# Patient Record
Sex: Female | Born: 1940 | Race: White | Hispanic: No | Marital: Married | Smoking: Former smoker
Health system: Southern US, Community
[De-identification: ages and names within clinical notes are randomized; demographics above are authoritative.]

## PROBLEM LIST (undated history)

## (undated) DIAGNOSIS — D649 Anemia, unspecified: Secondary | ICD-10-CM

## (undated) DIAGNOSIS — C569 Malignant neoplasm of unspecified ovary: Secondary | ICD-10-CM

## (undated) DIAGNOSIS — S46001A Unspecified injury of muscle(s) and tendon(s) of the rotator cuff of right shoulder, initial encounter: Secondary | ICD-10-CM

## (undated) DIAGNOSIS — I10 Essential (primary) hypertension: Secondary | ICD-10-CM

## (undated) DIAGNOSIS — M5030 Other cervical disc degeneration, unspecified cervical region: Secondary | ICD-10-CM

## (undated) DIAGNOSIS — N200 Calculus of kidney: Secondary | ICD-10-CM

## (undated) DIAGNOSIS — C719 Malignant neoplasm of brain, unspecified: Secondary | ICD-10-CM

## (undated) DIAGNOSIS — H409 Unspecified glaucoma: Secondary | ICD-10-CM

## (undated) DIAGNOSIS — E079 Disorder of thyroid, unspecified: Secondary | ICD-10-CM

## (undated) DIAGNOSIS — J910 Malignant pleural effusion: Secondary | ICD-10-CM

## (undated) DIAGNOSIS — K5790 Diverticulosis of intestine, part unspecified, without perforation or abscess without bleeding: Secondary | ICD-10-CM

## (undated) DIAGNOSIS — K219 Gastro-esophageal reflux disease without esophagitis: Secondary | ICD-10-CM

## (undated) DIAGNOSIS — C5700 Malignant neoplasm of unspecified fallopian tube: Secondary | ICD-10-CM

## (undated) DIAGNOSIS — Z8669 Personal history of other diseases of the nervous system and sense organs: Secondary | ICD-10-CM

## (undated) DIAGNOSIS — G43909 Migraine, unspecified, not intractable, without status migrainosus: Secondary | ICD-10-CM

## (undated) DIAGNOSIS — E785 Hyperlipidemia, unspecified: Secondary | ICD-10-CM

## (undated) HISTORY — PX: ABDOMINAL HYSTERECTOMY: SHX81

## (undated) HISTORY — DX: Hyperlipidemia, unspecified: E78.5

## (undated) HISTORY — DX: Gastro-esophageal reflux disease without esophagitis: K21.9

## (undated) HISTORY — DX: Personal history of other diseases of the nervous system and sense organs: Z86.69

## (undated) HISTORY — DX: Other cervical disc degeneration, unspecified cervical region: M50.30

## (undated) HISTORY — PX: TONSILLECTOMY AND ADENOIDECTOMY: SUR1326

## (undated) HISTORY — PX: OTHER SURGICAL HISTORY: SHX169

## (undated) HISTORY — PX: BILATERAL SALPINGOOPHORECTOMY: SHX1223

## (undated) HISTORY — DX: Migraine, unspecified, not intractable, without status migrainosus: G43.909

## (undated) HISTORY — DX: Unspecified glaucoma: H40.9

## (undated) HISTORY — DX: Anemia, unspecified: D64.9

## (undated) HISTORY — DX: Unspecified injury of muscle(s) and tendon(s) of the rotator cuff of right shoulder, initial encounter: S46.001A

## (undated) HISTORY — DX: Malignant neoplasm of brain, unspecified: C71.9

## (undated) HISTORY — DX: Malignant neoplasm of unspecified ovary: C56.9

## (undated) HISTORY — DX: Diverticulosis of intestine, part unspecified, without perforation or abscess without bleeding: K57.90

## (undated) HISTORY — DX: Essential (primary) hypertension: I10

## (undated) HISTORY — DX: Calculus of kidney: N20.0

## (undated) HISTORY — DX: Disorder of thyroid, unspecified: E07.9

---
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
## 2002-12-02 HISTORY — PX: EYE SURGERY: SHX253

## 2011-10-18 DIAGNOSIS — M810 Age-related osteoporosis without current pathological fracture: Secondary | ICD-10-CM

## 2011-10-18 HISTORY — DX: Age-related osteoporosis without current pathological fracture: M81.0

## 2012-09-29 DIAGNOSIS — N952 Postmenopausal atrophic vaginitis: Secondary | ICD-10-CM

## 2012-09-29 HISTORY — DX: Postmenopausal atrophic vaginitis: N95.2

## 2014-09-05 HISTORY — PX: NOSE SURGERY: SHX723

## 2015-08-03 HISTORY — PX: COLONOSCOPY: SHX174

## 2017-04-03 DIAGNOSIS — J910 Malignant pleural effusion: Secondary | ICD-10-CM

## 2017-04-03 HISTORY — DX: Malignant pleural effusion: J91.0

## 2017-08-02 HISTORY — PX: CHOLECYSTECTOMY: SHX55

## 2018-11-02 DIAGNOSIS — C5700 Malignant neoplasm of unspecified fallopian tube: Secondary | ICD-10-CM

## 2018-11-02 HISTORY — DX: Malignant neoplasm of unspecified fallopian tube: C57.00

## 2019-07-26 ENCOUNTER — Telehealth: Payer: Self-pay | Admitting: *Deleted

## 2019-07-26 NOTE — Telephone Encounter (Signed)
Called and spoke with the patient, scheduled an appt for 9/16

## 2019-08-17 DIAGNOSIS — J910 Malignant pleural effusion: Secondary | ICD-10-CM

## 2019-08-18 ENCOUNTER — Encounter: Payer: Self-pay | Admitting: Gynecologic Oncology

## 2019-08-18 ENCOUNTER — Other Ambulatory Visit: Payer: Self-pay

## 2019-08-18 ENCOUNTER — Inpatient Hospital Stay: Payer: Medicare Other

## 2019-08-18 ENCOUNTER — Inpatient Hospital Stay: Payer: Medicare Other | Attending: Gynecologic Oncology | Admitting: Gynecologic Oncology

## 2019-08-18 VITALS — BP 118/76 | HR 62 | Temp 98.0°F | Resp 17 | Ht 62.0 in | Wt 160.7 lb

## 2019-08-18 DIAGNOSIS — E785 Hyperlipidemia, unspecified: Secondary | ICD-10-CM | POA: Diagnosis not present

## 2019-08-18 DIAGNOSIS — Z9221 Personal history of antineoplastic chemotherapy: Secondary | ICD-10-CM

## 2019-08-18 DIAGNOSIS — Z791 Long term (current) use of non-steroidal anti-inflammatories (NSAID): Secondary | ICD-10-CM | POA: Insufficient documentation

## 2019-08-18 DIAGNOSIS — C569 Malignant neoplasm of unspecified ovary: Secondary | ICD-10-CM | POA: Diagnosis present

## 2019-08-18 DIAGNOSIS — Z90722 Acquired absence of ovaries, bilateral: Secondary | ICD-10-CM | POA: Diagnosis not present

## 2019-08-18 DIAGNOSIS — Z90710 Acquired absence of both cervix and uterus: Secondary | ICD-10-CM

## 2019-08-18 DIAGNOSIS — I10 Essential (primary) hypertension: Secondary | ICD-10-CM | POA: Diagnosis not present

## 2019-08-18 DIAGNOSIS — M8580 Other specified disorders of bone density and structure, unspecified site: Secondary | ICD-10-CM | POA: Diagnosis not present

## 2019-08-18 DIAGNOSIS — J910 Malignant pleural effusion: Secondary | ICD-10-CM | POA: Insufficient documentation

## 2019-08-18 DIAGNOSIS — E039 Hypothyroidism, unspecified: Secondary | ICD-10-CM | POA: Insufficient documentation

## 2019-08-18 DIAGNOSIS — Z79899 Other long term (current) drug therapy: Secondary | ICD-10-CM | POA: Diagnosis not present

## 2019-08-18 NOTE — Progress Notes (Signed)
Consult Note: Gyn-Onc  Waunita Andrea Emminger 78 y.o. female  CC:  Chief Complaint  Patient presents with  . Ovarian Cancer    Assessment/Plan:  Ms. Keyshawna Andrea Asay  is a 78 y.o.  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  She is currently NED/stable disease with questionable cardiophrenic nodes in the chest that are enlarged and PET avid but stable over time.  She has no symptoms concerning for recurrence and no significant residual toxicity from her therapies.  She is scheduled to see Dr. Callahan in [place] in October 2020 where a repeat PET scan will be performed in addition to a Ca1 25 and physical evaluation.  She is currently receiving 3 monthly evaluations with regular serial pet imaging to monitor the nodal disease.  We will obtain a Ca1 25 at today's visit to serve as baseline for [place] lab.  She will follow-up here in [place] periodically when she is located in town and schedule for routine follow-up, or if she develops recurrence and desires treatment here locally in town as her son lives here.  When she follows up with Dr. Callahan she will inquire about BRCA germline and somatic testing.   HPI: Ms Adelynn Brickey is a 78 year old P2 who was seen as a new patient for transfer of care for a history of recurrent stage IIIC fallopian tube cancer first diagnosed in May, 2018.   The patient presented with advanced intraperitoneal disease and underwent 3 cycles of neoadjuvant chemotherapy with carboplatin paclitaxel between May and August 2018.  She then underwent an exploratory laparotomy and interval cytoreductive surgery with Dr. Callahan in [place] in August 2018.  She was staged as stage IIIb.  She then completed 3 postoperative cycles of adjuvant chemotherapy with carboplatin  paclitaxel.  Germline genetic testings were negative.  She remained disease-free after completing chemotherapy in November 2018 until an asymptomatic elevation in Ca1 25 was noted to be at level 30 in November 2019.  In February 2028 had risen to 54 and a CT scan of the chest abdomen and pelvis did not show evidence of recurrent disease.  Subsequent PET CT imaging performed in March 2020 was significant for hypermetabolic left temporal lobe brain mass which measured 1.5 x 2 cm and additional pericardiophrenic left lymph node measuring 1.1 cm.  The brain recurrence was treated with surgical resection followed by brain radiation.  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  She had received ongoing observation therapy with serial PET imaging throughout 2020 to monitor the cardiophrenic lymph node.  It had remained stable including at the July 2020 PET/CT.  At this time it measured 12 x 16 mm.  Is hypermetabolic with an FDG uptake of 8.1.  No new lymph nodes were present on no new signs of progressive metastatic disease.  Ca1 25 was normal at 24 on June 14, 2019.  The patient is otherwise quite healthy.  She has a history of glaucoma, hypertension, hyperlipidemia, renal stones, diverticulosis, hypothyroidism, degenerative cervical intervertebral discs, osteopenia, anemia and difficulty sleeping.  Her past surgical history is remarkable for cholecystectomy, tonsillectomy, a remote history of a hysterectomy in 1987 for benign disease, and exploratory laparotomy bilateral salpingo-oophorectomy with radical tumor debulking on July 22, 2017 in [place] at [facility], and eye surgery in 2004.  Her last colonoscopy was   in 2011.  She has had 2 prior vaginal deliveries.  She has no significant family history for malignancy particular breast or ovarian.  Her son lives in [place] and is Magazine features editor of the Network engineer at [employer].   His team was instrumental in the development of paclitaxel as a chemotherapeutic agent.  She has grandchildren who live in the area and are involved with sports.  Her daughter lives in [place] and works in event development and business.  Due to the lack of proximity of her adult children and her grandchildren the patient and her husband plan to spend increasing time in [place] and away from [place] as they age.  If she requires future salvage therapy for her ovarian cancer relapses she desires treatment in [place] as she will be close to her son in that event.  Current Meds:  Outpatient Encounter Medications as of 08/18/2019  Medication Sig  . atorvastatin (LIPITOR) 40 MG tablet Take 40 mg by mouth daily.  Marland Kitchen BIOTIN PO Take by mouth.  . calcium carbonate (TUMS - DOSED IN MG ELEMENTAL CALCIUM) 500 MG chewable tablet Chew 1 tablet by mouth daily.  Marland Kitchen CRANBERRY PO Take by mouth.  . dexamethasone (DECADRON) 4 MG tablet Take 4 mg by mouth 2 (two) times daily with a meal.  . docusate sodium (COLACE) 100 MG capsule Take 100 mg by mouth 2 (two) times daily.  . fluticasone (VERAMYST) 27.5 MCG/SPRAY nasal spray Place 2 sprays into the nose daily.  Marland Kitchen guaiFENesin (MUCINEX) 600 MG 12 hr tablet Take by mouth daily.  . hydrochlorothiazide (HYDRODIURIL) 25 MG tablet Take 25 mg by mouth daily.  Marland Kitchen levETIRAcetam (KEPPRA) 500 MG tablet Take 500 mg by mouth 2 (two) times daily.  Marland Kitchen levothyroxine (SYNTHROID) 50 MCG tablet Take 50 mcg by mouth daily before breakfast.  . meclizine (ANTIVERT) 25 MG tablet Take 25 mg by mouth 3 (three) times daily as needed for dizziness.  . Melatonin 10 MG TABS Take by mouth.  . meloxicam (MOBIC) 15 MG tablet Take 15 mg by mouth daily.  . mirabegron ER (MYRBETRIQ) 50 MG TB24 tablet Take 50 mg by mouth daily.  . Multiple Vitamin (MULTIVITAMIN) tablet Take 1 tablet by mouth daily.  . nitrofurantoin, macrocrystal-monohydrate, (MACROBID) 100 MG capsule Take 100 mg by mouth 2  (two) times daily.  Marland Kitchen omeprazole (PRILOSEC) 20 MG capsule Take 20 mg by mouth daily.  . propranolol (INNOPRAN XL) 120 MG 24 hr capsule Take 120 mg by mouth at bedtime.  . SUMAtriptan (IMITREX) 100 MG tablet Take 100 mg by mouth every 2 (two) hours as needed for migraine. May repeat in 2 hours if headache persists or recurs.  . timolol (TIMOPTIC-XR) 0.25 % ophthalmic gel-forming Place 1 drop into both eyes daily.  . [DISCONTINUED] HYDROcodone-acetaminophen (NORCO/VICODIN) 5-325 MG tablet Take 1 tablet by mouth every 6 (six) hours as needed for moderate pain.  . [DISCONTINUED] HYDROCODONE-APAP-DIETARY PROD PO Take by mouth.   No facility-administered encounter medications on file as of 08/18/2019.     Allergy:  Allergies  Allergen Reactions  . Ambien [Zolpidem Tartrate] Other (See Comments)    Delirium  . Ceclor [Cefaclor] Other (See Comments)    unknown  . Keflex [Cephalexin] Other (See Comments)    unknown  . Noroxin [Norfloxacin] Other (See Comments)    unknown  . Scallops [Shellfish Allergy] Nausea And Vomiting and Swelling    Social Hx:   Social History   Socioeconomic History  . Marital status: Married  Spouse name: Not on file  . Number of children: Not on file  . Years of education: Not on file  . Highest education level: Not on file  Occupational History  . Not on file  Social Needs  . Financial resource strain: Not on file  . Food insecurity    Worry: Not on file    Inability: Not on file  . Transportation needs    Medical: Not on file    Non-medical: Not on file  Tobacco Use  . Smoking status: Never Smoker  . Smokeless tobacco: Never Used  Substance and Sexual Activity  . Alcohol use: Not on file  . Drug use: Never  . Sexual activity: Not on file  Lifestyle  . Physical activity    Days per week: Not on file    Minutes per session: Not on file  . Stress: Not on file  Relationships  . Social Herbalist on phone: Not on file    Gets  together: Not on file    Attends religious service: Not on file    Active member of club or organization: Not on file    Attends meetings of clubs or organizations: Not on file    Relationship status: Not on file  . Intimate partner violence    Fear of current or ex partner: Not on file    Emotionally abused: Not on file    Physically abused: Not on file    Forced sexual activity: Not on file  Other Topics Concern  . Not on file  Social History Narrative  . Not on file    Past Surgical Hx: History reviewed. No pertinent surgical history.  Past Medical Hx:  Past Medical History:  Diagnosis Date  . Anemia   . Brain cancer ([place])    metastasis  . Fallopian tube cancer, carcinoma ([place]) 11/02/2018  . Hyperlipidemia   . Hypertension   . 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 Migraine   . Ovarian cancer ([place])   . Thyroid disease    hypothyroid    Past Gynecological History:  See HPI, SVD x 2 No LMP recorded.  Family Hx:  Family History  Problem Relation Age of Onset  . Hypertension Mother   . Congestive Heart Failure Mother   . Macular degeneration Father   . Non-Hodgkin's lymphoma Brother     Review of Systems:  Constitutional  Feels well,   ENT Normal appearing ears and nares bilaterally Skin/Breast  No rash, sores, jaundice, itching, dryness Cardiovascular  No chest pain, shortness of breath, or edema  Pulmonary  No cough or wheeze.  Gastro Intestinal  No nausea, vomitting, or diarrhoea. No bright red blood per rectum, no abdominal pain, change in bowel movement, or constipation.  Genito Urinary  No frequency, urgency, dysuria, no bleeding.  Musculo Skeletal  No myalgia, arthralgia, joint swelling or pain  Neurologic  No weakness, numbness, change in gait,  Psychology  No depression, anxiety, insomnia. + insomnia  Vitals:  Blood pressure 118/76, pulse 62, temperature 98 F (36.7 C), temperature source Oral, resp. rate 17, height 5' 2" (1.575 m),  weight 160 lb 11.2 oz (72.9 kg), SpO2 100 %.  Physical Exam: WD in NAD Neck  Supple NROM, without any enlargements.  Lymph Node Survey No cervical supraclavicular or inguinal adenopathy Cardiovascular  Pulse normal rate, regularity and rhythm. S1 and S2 normal.  Lungs  Clear to auscultation bilateraly, without wheezes/crackles/rhonchi. Good air movement.  Skin  No rash/lesions/breakdown  Psychiatry  Alert and oriented to person, place, and time  Abdomen  Normoactive bowel sounds, abdomen soft, non-tender and mildly obese without evidence of hernia. Back No CVA tenderness Genito Urinary  Vulva/vagina: Normal external female genitalia.  No lesions. No discharge or bleeding.  Bladder/urethra:  No lesions or masses, well supported bladder  Vagina: normal, smooth, no masses, no lesions  Cervix and uterus surgically absent  Adnexa: no palpable masses. Rectal  deferred Extremities  No bilateral cyanosis, clubbing or edema.   Emma C Rossi, MD  08/18/2019, 5:29 PM     

## 2019-08-18 NOTE — Patient Instructions (Signed)
Dr Serita Grit office can be reached at [phone] to schedule appointments or to ask her questions about your care.  She will have a CA 125 drawn today to evaluate your baseline value.  Next time you visit Dr Elvis Coil office, ask if you received "somatic and germline genetic testing for BRCA genes or homologous recombination deficits".   Dr Denman George recommends continuing 3 monthly evaluations as Dr Rogue Bussing has proposed, and will be happy to incorporate visits at her office as this suits your schedule.

## 2019-08-19 LAB — CA 125: Cancer Antigen (CA) 125: 25.5 U/mL (ref 0.0–38.1)

## 2019-08-20 ENCOUNTER — Telehealth: Payer: Self-pay

## 2019-08-20 NOTE — Telephone Encounter (Signed)
I let Connie Sharp know her CA 125 was 25.5. I told her we faxed a copy of the results to her physician In [place]. She verbalized understanding.

## 2019-08-23 ENCOUNTER — Ambulatory Visit
Admission: RE | Admit: 2019-08-23 | Discharge: 2019-08-23 | Disposition: A | Discharge status: Home / Self Care | Payer: Self-pay | Source: Ambulatory Visit | Attending: Gynecologic Oncology | Admitting: Gynecologic Oncology

## 2019-08-23 DIAGNOSIS — C569 Malignant neoplasm of unspecified ovary: Secondary | ICD-10-CM

## 2019-10-11 ENCOUNTER — Telehealth: Payer: Self-pay | Admitting: *Deleted

## 2019-10-11 NOTE — Telephone Encounter (Signed)
Returned the patient's call and gave her the number to [facility] billing

## 2021-03-02 ENCOUNTER — Telehealth: Payer: Self-pay | Admitting: *Deleted

## 2021-03-02 NOTE — Telephone Encounter (Signed)
Called and left the patient a message to call the office back. Patient needs a follow up appt scheduled

## 2021-03-05 ENCOUNTER — Ambulatory Visit: Payer: Medicare Other | Admitting: Gynecologic Oncology

## 2021-03-06 ENCOUNTER — Telehealth: Payer: Self-pay | Admitting: *Deleted

## 2021-03-06 NOTE — Telephone Encounter (Addendum)
Patient's son called the office to try and schedule an appt for this week. Explained that "Per Dr Denman George, our office needed records from the office at [facility] before our office sees her." Patient's son stated "I don't know Dr Denman George would need the records, she has seen her before. She has also worked with moms doctor before too. I understand the policy for the records needs. But I don't think she will really them. We are only talking about a couple of weeks to month for to be here. Just go ahead and schedule the appt. My sister is in town for the next three days and would like to come and sit down with her and talk. Just go ahead and schedule the appt. Why can't you do that." Explained that per Dr Denman George "no appt until our office has records." Explained that I would give the message to Dr Denman George who is at [facility] today and the office will call him back. Spoke with [facility] and was giving permission to schedule the patient without all the records.    Called the patient and left a message to call the office back. Patient scheduled for an appt on 4/7 at 2:30 pm

## 2021-03-07 ENCOUNTER — Other Ambulatory Visit: Payer: Self-pay

## 2021-03-07 ENCOUNTER — Telehealth: Payer: Self-pay | Admitting: *Deleted

## 2021-03-07 ENCOUNTER — Telehealth: Payer: Self-pay | Admitting: Oncology

## 2021-03-07 ENCOUNTER — Ambulatory Visit
Admission: RE | Admit: 2021-03-07 | Discharge: 2021-03-07 | Disposition: A | Discharge status: Home / Self Care | Payer: Self-pay | Source: Ambulatory Visit | Attending: Gynecologic Oncology | Admitting: Gynecologic Oncology

## 2021-03-07 ENCOUNTER — Encounter: Payer: Self-pay | Admitting: Gynecologic Oncology

## 2021-03-07 DIAGNOSIS — C569 Malignant neoplasm of unspecified ovary: Secondary | ICD-10-CM

## 2021-03-07 DIAGNOSIS — C5700 Malignant neoplasm of unspecified fallopian tube: Secondary | ICD-10-CM

## 2021-03-07 NOTE — Telephone Encounter (Signed)
Called Dr. Arbutus Leas office with [facility] and requested BRCA results, PD-L1 results, genetic testing results and chemotherapy treatment history.  Also sent faxed request to [phone].

## 2021-03-07 NOTE — Telephone Encounter (Addendum)
Called and spoke with the patient, scheduled a follow up appt for tomorrow. Also called and left the patient's son a message with appt date/time

## 2021-03-08 ENCOUNTER — Other Ambulatory Visit: Payer: Self-pay

## 2021-03-08 ENCOUNTER — Encounter: Payer: Self-pay | Admitting: Oncology

## 2021-03-08 ENCOUNTER — Inpatient Hospital Stay: Payer: Medicare Other | Attending: Gynecologic Oncology | Admitting: Gynecologic Oncology

## 2021-03-08 VITALS — BP 127/63 | HR 82 | Temp 97.6°F | Resp 16 | Ht 62.0 in | Wt 154.2 lb

## 2021-03-08 DIAGNOSIS — C7931 Secondary malignant neoplasm of brain: Secondary | ICD-10-CM | POA: Insufficient documentation

## 2021-03-08 DIAGNOSIS — Z923 Personal history of irradiation: Secondary | ICD-10-CM | POA: Insufficient documentation

## 2021-03-08 DIAGNOSIS — C569 Malignant neoplasm of unspecified ovary: Secondary | ICD-10-CM

## 2021-03-08 DIAGNOSIS — K5909 Other constipation: Secondary | ICD-10-CM | POA: Diagnosis not present

## 2021-03-08 DIAGNOSIS — Z90722 Acquired absence of ovaries, bilateral: Secondary | ICD-10-CM | POA: Diagnosis not present

## 2021-03-08 DIAGNOSIS — G620 Drug-induced polyneuropathy: Secondary | ICD-10-CM | POA: Insufficient documentation

## 2021-03-08 DIAGNOSIS — Z79899 Other long term (current) drug therapy: Secondary | ICD-10-CM | POA: Diagnosis not present

## 2021-03-08 DIAGNOSIS — Z9221 Personal history of antineoplastic chemotherapy: Secondary | ICD-10-CM | POA: Insufficient documentation

## 2021-03-08 DIAGNOSIS — T380X5A Adverse effect of glucocorticoids and synthetic analogues, initial encounter: Secondary | ICD-10-CM | POA: Insufficient documentation

## 2021-03-08 DIAGNOSIS — E039 Hypothyroidism, unspecified: Secondary | ICD-10-CM | POA: Diagnosis not present

## 2021-03-08 DIAGNOSIS — I10 Essential (primary) hypertension: Secondary | ICD-10-CM | POA: Diagnosis not present

## 2021-03-08 DIAGNOSIS — G720 Drug-induced myopathy: Secondary | ICD-10-CM | POA: Diagnosis not present

## 2021-03-08 DIAGNOSIS — Z90710 Acquired absence of both cervix and uterus: Secondary | ICD-10-CM | POA: Diagnosis not present

## 2021-03-08 DIAGNOSIS — Z807 Family history of other malignant neoplasms of lymphoid, hematopoietic and related tissues: Secondary | ICD-10-CM | POA: Diagnosis not present

## 2021-03-08 DIAGNOSIS — Z9079 Acquired absence of other genital organ(s): Secondary | ICD-10-CM | POA: Diagnosis not present

## 2021-03-08 DIAGNOSIS — Z87891 Personal history of nicotine dependence: Secondary | ICD-10-CM | POA: Diagnosis not present

## 2021-03-08 DIAGNOSIS — Z8249 Family history of ischemic heart disease and other diseases of the circulatory system: Secondary | ICD-10-CM | POA: Diagnosis not present

## 2021-03-08 NOTE — Patient Instructions (Signed)
Dr. Denman George is placing referrals to Medical Oncology - Dr. Heath Lark and neurology.  We will notify you of these appointments.

## 2021-03-08 NOTE — Progress Notes (Signed)
Follow-up Note: Gyn-Onc  Connie Sharp 80 y.o. female  CC:  Chief Complaint  Patient presents with  . Malignant neoplasm of ovary, unspecified laterality [facility])    Assessment/Plan:  Connie Sharp  is a 80 y.o.  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 Now on pembrolizumab (since 11/21) and niraparib (since 3/22).   ECOG performance status 1.   I had a lengthy discussion with the patient and her daughter today about her cancer.  I reviewed extensive records from the preceding 2 years of treatment in [place] for approximately 45 minutes.  I discussed with the patient and her daughter the nature of her disease, the distribution of her disease, and her prognosis.  I explained that her cancer was incurable with an estimated life expectancy of less than 6 months.  I explained the goals of therapy would be palliative and I described what this meant in terms of balancing risks of treatment versus side effects of the cancer with determining whether or not treatment is appropriate.  I explained that goals of treatment are to control progression and symptoms of the cancer but cure would not be possible.  I offered the patient withdrawal of care and initiation of hospice.  I explained that patients who initiate hospice early typically experience longer life expectancy than patients who initiate hospice late.  The patient is of good performance status right now and in my opinion a candidate for additional treatments albeit of palliative intent.  Additionally she is open and amenable to hospice at some time but in the future but not at present.  We reviewed options for treatment.  She has only just commenced treatment with a PARP inhibitor and has a BRCA1 positive  mutation in her tumor.  Therefore I feel it is reasonable to continue this treatment for 3-4 more months provided there is no obvious progression of disease within that time.  With respect to the pembrolizumab, it is not clear to me that this is offering benefit as she has had 2 brain/neuro metastatic progressions since she commenced this in November.  Therefore, despite her PD-L1 positive tumor, I favor discontinuing the Keytruda.  A third option would be to try an alternative cytotoxic agent that is known to cross the blood-brain bracket various such as etoposide or topotecan.  I explained that these agents are associated with more substantial toxicity than PARP inhibitor and a 10 to 15% likelihood of clinical response.  Therefore I would recommend reserving consideration of these agents for now in proceeding with ongoing niraparib treatment.  I will defer decision making regarding which treatment is most optimal for the patient to my medical oncology colleague, Dr. Alvy Bimler, who will see the patient on April 11.   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  At that time, germline testing for BRCA was negative.  The patient presented with advanced intraperitoneal disease (stage IIIC) and underwent 3 cycles of neoadjuvant chemotherapy with carboplatin paclitaxel between May and August 2018.  She then underwent an exploratory laparotomy and interval cytoreductive surgery (optimal) with Dr. Rogue Bussing in [place] in August 2018.  She was staged as stage  IIIb.  She then completed 3 postoperative cycles of adjuvant chemotherapy with carboplatin paclitaxel.  Germline genetic testings were negative.  She remained disease-free after completing chemotherapy in November 2018 until an asymptomatic elevation in Ca125 was noted to be at level 30 in November 2019 (12  month disease-free interval).  In February 2028 the CA 125 had risen to 41, however a CT scan of the chest abdomen and pelvis did not show evidence of recurrent disease.  Subsequent PET CT imaging performed in March 2020 was significant for hypermetabolic left temporal lobe brain mass which measured 1.5 x 2 cm and additional pericardiophrenic left lymph node measuring 1.1 cm.  The brain recurrence was treated with surgical resection followed by radiosurgery to the brain and radiation to new additional brain metastases.  Pathology from the brain lesion was consistent with a metastatic high-grade serous carcinoma.  After radiation she had received ongoing observation therapy with serial PET imaging throughout 2020 to monitor the cardiophrenic lymph node.  It had remained stable including at the July 2020 PET/CT.  At that scan it measured 12 x 16 mm, was hypermetabolic with an FDG uptake of 8.1.  No new lymph nodes were present on no new signs of progressive metastatic disease were seen.  Ca1 25 was normal at 24 on June 14, 2019.  Interval Hx:   The patient was noted to have a CA 125 of 25 in September, 2021 and imaging of the brain in September 2021 revealed for new brain metastases.  She subsequently underwent 20 Pearline Cables of brain radiation for salvage purposes.  Caris testing on her brain tumor tissue from her initial metastatic resection showed a BRCA1 somatic mutation and PD-L1 positivity.  She commenced on salvage pembrolizumab every 3 weekly in November 2021.      CT chest abdomen and pelvis in January 09, 2021 showed a stable cardiophrenic lymph nodes and no new metastases.  The MRI of the brain in February 2022 showed new brain metastases in the inferior anterolateral left frontal lobe, left anterior cingulate gyrus, bilateral trigeminal nerve region, and right superior cerebellar and anterior callosal lesions had increased in size.  She received whole brain radiation, 30 Pearline Cables, completed on  01/31/2021.  Ca1 25 was 58 on 02/21/2021.  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 She received a single fraction radiation of a grade 2 T1 of the cord region in March, 2022.   The patient was commenced on niraparib, a PARP inhibitor therapy, on 02/22/21. She is tolerating this well with some fatigue.   At that time she relocated to [facility] to be closer to her son.   She reported that she remains active throughout the day with one nap typically in the afternoon.  She sleeps approximately 8 to 10 hours per night.  She is independent with all activities of daily living.  She does not drive due to her recent brain surgery however feels that she would be able to do so had she not had this event.  She has minimal functional residual deficits from her neurologic events other than some word finding difficulty and some balance disturbance but ambulates independently with a cane.   Current Meds:  Outpatient Encounter Medications as of 03/08/2021  Medication Sig  . atorvastatin (LIPITOR) 40 MG tablet Take 40 mg by mouth daily.  . Cholecalciferol (VITAMIN D3) 50 MCG (2000 UT) TABS Take 1 tablet  by mouth daily.  Marland Kitchen dexamethasone (DECADRON) 4 MG tablet Take 2 mg by mouth 2 (two) times daily with a meal. Takig 2 mg every other day.  . docusate sodium (COLACE) 100 MG capsule Take 100 mg by mouth 2 (two) times daily.  . fluticasone (VERAMYST) 27.5 MCG/SPRAY nasal spray Place 2 sprays into the nose daily.  Marland Kitchen guaiFENesin (MUCINEX) 600 MG 12 hr tablet Take by mouth daily.  . hydrochlorothiazide (HYDRODIURIL) 25 MG tablet Take 25 mg by mouth daily.  Marland Kitchen levothyroxine (SYNTHROID) 50 MCG tablet Take 50 mcg by mouth daily before breakfast.  . LORazepam (ATIVAN) 1 MG tablet Take 1 mg by mouth at bedtime.  . Melatonin 10 MG TABS Take by mouth.  . meloxicam (MOBIC) 15 MG tablet Take 15 mg by mouth  daily.  . mirabegron ER (MYRBETRIQ) 50 MG TB24 tablet Take 50 mg by mouth daily.  . niraparib tosylate (ZEJULA) 100 MG capsule Zejula 100 mg capsule  Take 2 capsules every day by oral route as directed.  Marland Kitchen omeprazole (PRILOSEC) 20 MG capsule Take 20 mg by mouth daily.  . pantoprazole (PROTONIX) 40 MG tablet Take 40 mg by mouth at bedtime.  . propranolol (INNOPRAN XL) 120 MG 24 hr capsule Take 120 mg by mouth in the morning and at bedtime.  . sodium chloride 0.9 % SOLN 50 mL with pembrolizumab 100 MG/4ML SOLN 2 mg/kg Inject 2 mg/kg into the vein every 21 ( twenty-one) days.  . SUMAtriptan (IMITREX) 100 MG tablet Take 100 mg by mouth every 2 (two) hours as needed for migraine. May repeat in 2 hours if headache persists or recurs.  . timolol (TIMOPTIC-XR) 0.25 % ophthalmic gel-forming Place 1 drop into both eyes daily.   No facility-administered encounter medications on file as of 03/08/2021.    Allergy:  Allergies  Allergen Reactions  . Ambien [Zolpidem Tartrate] Other (See Comments)    Delirium  . Cephalexin Nausea And Vomiting, Hives and Rash  . Cefaclor Other (See Comments) and Hives    unknown  . Noroxin [Norfloxacin] Other (See Comments)    unknown  . Scallops [Shellfish Allergy] Nausea And Vomiting and Swelling    Social Hx:   Social History   Socioeconomic History  . Marital status: Married    Spouse name: Not on file  . Number of children: Not on file  . Years of education: Not on file  . Highest education level: Not on file  Occupational History  . Not on file  Tobacco Use  . Smoking status: Former Smoker    Packs/day: 0.25    Years: 4.00    Pack years: 1.00    Types: Cigarettes    Quit date: 03/07/1965    Years since quitting: 56.0  . Smokeless tobacco: Never Used  Vaping Use  . Vaping Use: Never used  Substance and Sexual Activity  . Alcohol use: Yes    Comment: occasionally  . Drug use: Never  . Sexual activity: Not Currently  Other Topics Concern  . Not  on file  Social History Narrative  . Not on file   Social Determinants of Health   Financial Resource Strain: Not on file  Food Insecurity: Not on file  Transportation Needs: Not on file  Physical Activity: Not on file  Stress: Not on file  Social Connections: Not on file  Intimate Partner Violence: Not on file    Past Surgical Hx:  Past Surgical History:  Procedure Laterality Date  . ABDOMINAL HYSTERECTOMY  1987  . BILATERAL SALPINGOOPHORECTOMY     07-22-2017  . CHOLECYSTECTOMY  08/02/2017  . COLONOSCOPY  08/2015  . EYE SURGERY Left 2004   Left eye wrikleremoval  . NOSE SURGERY  09/05/2014   Fractured Nose   . Plastic reconstructive      Scar tissue left breast at age 57  . TONSILLECTOMY AND ADENOIDECTOMY      Past Medical Hx:  Past Medical History:  Diagnosis Date  . Anemia   . Atrophic vaginitis 09/29/2012  . Brain cancer ([place])    metastasis  . Degenerative, intervertebral disc, cervical   . Diverticulosis   . Fallopian tube cancer, carcinoma ([place]) 11/02/2018  . GERD (gastroesophageal reflux disease)   . Glaucoma    left eye  . Hx of migraines   . Hyperlipidemia   . Hypertension   . Injury of right rotator cuff   . Kidney stones   . 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 Migraine   . Osteoporosis 10/18/2011  . Ovarian cancer ([place])   . Thyroid disease    hypothyroid    Past Gynecological History:  See HPI, SVD x 2 No LMP recorded.  Family Hx:  Family History  Problem Relation Age of Onset  . Hypertension Mother   . Congestive Heart Failure Mother   . Hypercholesterolemia Mother   . Macular degeneration Father   . Non-Hodgkin's lymphoma Brother   . Colon cancer Neg Hx   . Ovarian cancer Neg Hx   . Breast cancer Neg Hx   . Endometrial cancer Neg Hx   . Pancreatic cancer Neg Hx   . Prostate cancer Neg Hx     Review of Systems:  Constitutional  + fatigue   ENT Normal appearing ears and nares bilaterally Skin/Breast  No rash,  sores, jaundice, itching, dryness Cardiovascular  No chest pain, shortness of breath, or edema  Pulmonary  No cough or wheeze.  Gastro Intestinal  +constipation.  Genito Urinary  No frequency, urgency, dysuria, no bleeding.  Musculo Skeletal  No myalgia, arthralgia, joint swelling or pain  Neurologic  No weakness, numbness, change in gait,  Psychology  No depression, anxiety, insomnia. + insomnia  Vitals:  There were no vitals taken for this visit.  Physical Exam: WD in NAD Neck  Supple NROM, without any enlargements.  Lymph Node Survey deferred Cardiovascular  Warm, well perfused peripheries Lungs  No increased WOB Skin  No rash/lesions/breakdown  Psychiatry  Alert and oriented to person, place, and time  Abdomen  deferred Back No CVA tenderness deferred Rectal  deferred Extremities  No bilateral cyanosis, clubbing or edema.   Thereasa Solo, MD  03/08/2021, 2:16 PM

## 2021-03-09 ENCOUNTER — Telehealth: Payer: Self-pay | Admitting: Oncology

## 2021-03-09 NOTE — Telephone Encounter (Signed)
Called Connie Sharp and scheduled new patient appointment with Dr. Alvy Bimler on 03/12/21 at 8:00.  Advised her to arrive 30 minutes early to register and to bring a family member to the appointment.  She verbalized understanding and agreement.

## 2021-03-12 ENCOUNTER — Inpatient Hospital Stay
Admission: RE | Admit: 2021-03-12 | Discharge: 2021-03-12 | Disposition: A | Discharge status: Home / Self Care | Payer: Self-pay | Source: Ambulatory Visit | Attending: Gynecologic Oncology | Admitting: Gynecologic Oncology

## 2021-03-12 ENCOUNTER — Other Ambulatory Visit: Payer: Self-pay

## 2021-03-12 ENCOUNTER — Encounter: Payer: Self-pay | Admitting: Hematology and Oncology

## 2021-03-12 ENCOUNTER — Other Ambulatory Visit (HOSPITAL_COMMUNITY): Payer: Self-pay | Admitting: Gynecologic Oncology

## 2021-03-12 ENCOUNTER — Ambulatory Visit
Admission: RE | Admit: 2021-03-12 | Discharge: 2021-03-12 | Disposition: A | Discharge status: Home / Self Care | Payer: Self-pay | Source: Ambulatory Visit | Attending: Gynecologic Oncology | Admitting: Gynecologic Oncology

## 2021-03-12 ENCOUNTER — Other Ambulatory Visit: Payer: Self-pay | Admitting: Radiation Therapy

## 2021-03-12 ENCOUNTER — Inpatient Hospital Stay (HOSPITAL_BASED_OUTPATIENT_CLINIC_OR_DEPARTMENT_OTHER): Payer: Medicare Other | Admitting: Hematology and Oncology

## 2021-03-12 ENCOUNTER — Inpatient Hospital Stay: Payer: Medicare Other

## 2021-03-12 ENCOUNTER — Other Ambulatory Visit: Payer: Self-pay | Admitting: Oncology

## 2021-03-12 VITALS — BP 129/73 | HR 84 | Temp 97.7°F | Resp 17 | Ht 62.0 in | Wt 149.1 lb

## 2021-03-12 DIAGNOSIS — Z923 Personal history of irradiation: Secondary | ICD-10-CM

## 2021-03-12 DIAGNOSIS — C801 Malignant (primary) neoplasm, unspecified: Secondary | ICD-10-CM

## 2021-03-12 DIAGNOSIS — C569 Malignant neoplasm of unspecified ovary: Secondary | ICD-10-CM

## 2021-03-12 DIAGNOSIS — I10 Essential (primary) hypertension: Secondary | ICD-10-CM

## 2021-03-12 DIAGNOSIS — K5909 Other constipation: Secondary | ICD-10-CM

## 2021-03-12 DIAGNOSIS — T380X5A Adverse effect of glucocorticoids and synthetic analogues, initial encounter: Secondary | ICD-10-CM

## 2021-03-12 DIAGNOSIS — Z87891 Personal history of nicotine dependence: Secondary | ICD-10-CM

## 2021-03-12 DIAGNOSIS — E039 Hypothyroidism, unspecified: Secondary | ICD-10-CM

## 2021-03-12 DIAGNOSIS — Z807 Family history of other malignant neoplasms of lymphoid, hematopoietic and related tissues: Secondary | ICD-10-CM

## 2021-03-12 DIAGNOSIS — Z79899 Other long term (current) drug therapy: Secondary | ICD-10-CM

## 2021-03-12 DIAGNOSIS — G720 Drug-induced myopathy: Secondary | ICD-10-CM

## 2021-03-12 DIAGNOSIS — Z90722 Acquired absence of ovaries, bilateral: Secondary | ICD-10-CM

## 2021-03-12 DIAGNOSIS — Z7189 Other specified counseling: Secondary | ICD-10-CM | POA: Insufficient documentation

## 2021-03-12 DIAGNOSIS — Z9221 Personal history of antineoplastic chemotherapy: Secondary | ICD-10-CM

## 2021-03-12 DIAGNOSIS — Z9079 Acquired absence of other genital organ(s): Secondary | ICD-10-CM

## 2021-03-12 DIAGNOSIS — Z90710 Acquired absence of both cervix and uterus: Secondary | ICD-10-CM

## 2021-03-12 DIAGNOSIS — Z8249 Family history of ischemic heart disease and other diseases of the circulatory system: Secondary | ICD-10-CM

## 2021-03-12 LAB — COMPREHENSIVE METABOLIC PANEL
ALT: 25 U/L (ref 0–44)
AST: 19 U/L (ref 15–41)
Albumin: 3.6 g/dL (ref 3.5–5.0)
Alkaline Phosphatase: 59 U/L (ref 38–126)
Anion gap: 13 (ref 5–15)
BUN: 28 mg/dL — ABNORMAL HIGH (ref 8–23)
CO2: 28 mmol/L (ref 22–32)
Calcium: 8.9 mg/dL (ref 8.9–10.3)
Chloride: 101 mmol/L (ref 98–111)
Creatinine, Ser: 1.16 mg/dL — ABNORMAL HIGH (ref 0.44–1.00)
GFR, Estimated: 49 mL/min — ABNORMAL LOW (ref >60)
Glucose, Bld: 114 mg/dL — ABNORMAL HIGH (ref 70–99)
Potassium: 3.1 mmol/L — ABNORMAL LOW (ref 3.5–5.1)
Sodium: 142 mmol/L (ref 135–145)
Total Bilirubin: 1.7 mg/dL — ABNORMAL HIGH (ref 0.3–1.2)
Total Protein: 6.1 g/dL — ABNORMAL LOW (ref 6.5–8.1)

## 2021-03-12 LAB — CBC WITH DIFFERENTIAL/PLATELET
Abs Immature Granulocytes: 0.03 10*3/uL (ref 0.00–0.07)
Basophils Absolute: 0 10*3/uL (ref 0.0–0.1)
Basophils Relative: 0 %
Eosinophils Absolute: 0 10*3/uL (ref 0.0–0.5)
Eosinophils Relative: 0 %
HCT: 34.9 % — ABNORMAL LOW (ref 36.0–46.0)
Hemoglobin: 12.3 g/dL (ref 12.0–15.0)
Immature Granulocytes: 1 %
Lymphocytes Relative: 10 %
Lymphs Abs: 0.5 10*3/uL — ABNORMAL LOW (ref 0.7–4.0)
MCH: 32.7 pg (ref 26.0–34.0)
MCHC: 35.2 g/dL (ref 30.0–36.0)
MCV: 92.8 fL (ref 80.0–100.0)
Monocytes Absolute: 0.3 10*3/uL (ref 0.1–1.0)
Monocytes Relative: 6 %
Neutro Abs: 4.3 10*3/uL (ref 1.7–7.7)
Neutrophils Relative %: 83 %
Platelets: 226 10*3/uL (ref 150–400)
RBC: 3.76 MIL/uL — ABNORMAL LOW (ref 3.87–5.11)
RDW: 14.6 % (ref 11.5–15.5)
WBC: 5.3 10*3/uL (ref 4.0–10.5)
nRBC: 0 % (ref 0.0–0.2)

## 2021-03-12 LAB — T4, FREE: Free T4: 1.67 ng/dL — ABNORMAL HIGH (ref 0.61–1.12)

## 2021-03-12 LAB — TSH: TSH: 1.591 u[IU]/mL (ref 0.308–3.960)

## 2021-03-12 NOTE — Progress Notes (Addendum)
Gynecologic Oncology Multi-Disciplinary Disposition Conference Note  Date of the Conference: 03/12/2021  Patient Name: Connie Sharp  Primary GYN Oncologist: Dr. 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 Disposition is pending referral to medical oncology and neuro oncology.   This Multidisciplinary conference took place involving physicians from [place], [place], Radiation Oncology, Pathology, Radiology along with the Gynecologic Oncology Nurse Practitioner and RN.  Comprehensive assessment of the patient's malignancy, staging, need for surgery, chemotherapy, radiation therapy, and need for further testing were reviewed. Supportive measures, both inpatient and following discharge were also discussed. The recommended plan of care is documented. Greater than 35 minutes were spent correlating and coordinating this patient's care.

## 2021-03-12 NOTE — Progress Notes (Signed)
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[place]) I have gone through over 150 pages of outside records and summarize her oncologic history It appears that the patient had progressive CNS disease with leptomeningeal involvement of her brain and spinal cord in the past few months, despite being on pembrolizumab with niraparib In my opinion, this constitutes progression of disease and she should not continue pembrolizumab with niraparib The patient has poor understanding of her treatment program process She thought she was only on Zejula for short period of time but according to documented outside records, she has been on Zejula since last year The patient desire further treatment because she has reasonable performance status I felt that her baseline performance status is not great and she likely feel good because she is still on dexamethasone 2 mg twice daily  Her case was presented at the GYN oncology tumor board At present time, her overall prognosis is poor and I do not support her decision to continue on palliative treatment However, to give her the benefit of the doubt, I will refer her to see one of her neuro oncologist for an opinion If our neuro oncologist felt she could benefit from other forms of treatment with reasonable success, I will support her decision for further treatment Overall, I recommend the patient to consider focusing on palliative care only but she is not at the point of stopping treatment yet  Her last set of blood work was over a month ago Today, I will order blood work for further evaluation I have discontinued a lot of other and necessary medications of her medication list today and suggest she establish with a new primary care doctor for management  Acquired hypothyroidism She is profoundly constipated I will check a TSH level  today  Other constipation According to documentation from her radiation oncologist, she likely have progression of neurological deficit and I suspect that is causing her bowels and bladder dysfunction I recommend aggressive laxative therapy  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 and had recent falls I recommend referral to cancer PT and rehab evaluation 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  Goals of care, counseling/discussion The patient has been told and she is fully aware of her poor prognosis but yet she is interested for more treatment She does not know why and what most of her medications are being used for I removed her medication for migraine, her diuretic therapy, Mobic and Zocor from her medication list to minimize risk of side effects We discussed goals of care, prognosis and advanced directives The patient desire to be resuscitated She enlisted her husband, her son and her daughter as healthcare power of attorney and trust them to make medical decision in the event that she cannot make medical decision for herself While she has realistic expectation of prognosis, I believe her reasonable performance status right now is a result of her medications At this point in time, I do not recommend dexamethasone taper   Orders Placed This Encounter  Procedures  . Comprehensive metabolic panel    Standing Status:   Future    Number of Occurrences:   1    Standing Expiration Date:   03/12/2022  . CBC with Differential/Platelet    Standing Status:   Future    Number of Occurrences:   1  Standing Expiration Date:   03/12/2022  . TSH    Standing Status:   Future    Number of Occurrences:   1    Standing Expiration Date:   03/12/2022  . T4, free    Standing Status:   Future    Number of Occurrences:   1    Standing Expiration Date:   03/12/2022  . CA 125    Standing Status:   Future    Number of Occurrences:   1    Standing  Expiration Date:   03/12/2022  . Ambulatory referral to Physical Therapy    Referral Priority:   Routine    Referral Type:   Physical Medicine    Referral Reason:   Specialty Services Required    Requested Specialty:   Physical Therapy    Number of Visits Requested:   1  . Amb Referral to Neuro Oncology    Referral Priority:   Routine    Referral Type:   Consultation    Referral Reason:   Specialty Services Required    Requested Specialty:   Oncology    Number of Visits Requested:   1    The total time spent in the appointment was 90 minutes encounter with patients including review of chart and various tests results, discussions about plan of care and coordination of care plan   All questions were answered. The patient knows to call the clinic with any problems, questions or concerns. No barriers to learning was detected.  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 female is here because of progression of her metastatic cancer She is here accompanied by her son, Connie Sharp Her husband, Connie Sharp is at home The patient and her husband relocated here to [place] recently due to progression of the disease and to get additional family support I have received over 150 pages of outside records which I have summarized and gone through with the patient  I have reviewed her chart and materials related to her cancer extensively and collaborated history with the patient. 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  She had abdominal paracentesis at the time   04/28/2017 - 06/10/2017 Chemotherapy   She had 3 cycles of neoadjuvant chemo with carboplatin and paclitaxel in [place]       07/22/2017 Surgery   She had exploratory laparotomy and interval cytoreductive surgery   08/26/2017 - 10/07/2017 Chemotherapy   She received 3 more cycles of postoperative chemotherapy with carboplatin and paclitaxel       02/01/2019 PET scan   Outside PET CT scan showed brain metastasis and cardio phrenic lymph node   02/18/2019 Surgery   She underwent neurosurgical resection of brain mass and postoperative radiation therapy.  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   06/02/2019 PET scan   Repeat outside PET CT scan showed cardiophrenic lymph node unchanged.   08/30/2019 Imaging   Outside MRI late September showed tumor in the roof of the left orbit.   09/06/2019 PET scan   October  2020 PET CT scan abnormalities on the left orbital tumor and stable cardiophrenic nodule.   10/04/2019 Surgery   She underwent radiosurgery for orbital tumor.   11/08/2019 Imaging   Outside follow-up MRI showed reduce in size of the size of left orbital tumor.   02/07/2020 Imaging   She was noted to have enlarging brain metastasis and receive further radiation treatment.   02/22/2020 - 03/14/2020 Radiation Therapy   She received treatment to left cerebellar lesion, retroclival lesion   05/12/2020 Imaging   Outside MRI and CT scan of the chest, abdomen and pelvis showed disease stability.   08/10/2020 Imaging   Outside CT imaging was reported as stable.  Brain MRI was significant for new infratentorial compartment metastasis.   10/02/2020 -  Radiation Therapy   Date of treatment is unknown but she received further radiation therapy to at least 4 lesions.   10/12/2020 - 02/22/2021 Chemotherapy   She has received concurrent Zejula at 200 mg daily  with pembrolizumab       01/10/2021 Imaging   MRI brain done elsewhere showed new leptomeningeal enhancement of the inferior anterior lateral left frontal lobe, new nodular enhancement of the left anterior cingulate gyrus, increased size of nodular leptomeningeal enhancement of bilateral trigeminal nerves, right superior cerebellar and anterior callosal enhancing lesions have increased in size and left vent trolled lateral cerebellar leptomeningeal enhancement is similar compared to before  CT imaging shows stability of cardiophrenic lymph node   01/17/2021 - 01/31/2021 Radiation Therapy   She received whole brain radiation therapy, 30 Gy in 10 fractions   01/31/2021 -  Radiation Therapy   She received whole brain radiation therapy, 30 Gy completed on 01/31/2021   02/21/2021 Tumor Marker   Patient's tumor was tested for the following markers: CA-125 Results of the tumor marker test revealed 58   02/26/2021 Imaging   Outside MRI spine showed leptomeningeal disease in the cervical and thoracic cord, bulky at T1 with significant epidural lesion in that location   03/12/2021 Cancer Staging   Staging form: Ovary, Fallopian Tube, and Primary Peritoneal Carcinoma, AJCC 8th Edition - Clinical stage from 03/12/2021: Stage IV (rcT3, cN0, pM1) - Signed by Heath Lark, MD on 03/12/2021 Stage prefix: Recurrence    The patient felt fairly well today. She denies pain She is weak and has persistent neuropathy in her feet She fell several weeks ago but did not have major injuries She purchase a cane from the store She described proximal myopathy and difficulties getting out of chair recently She denies seizures activity She denies nausea She is profoundly constipated over the past few months She average 1 bowel movement every 7 to 10 days She take medications Mybetriq for urinary retention She take migraine medications for headaches but has not taken some for long time No recent visual neurological deficit  or speech deficits Her appetite is poor and limited She admits she is not drinking enough water but cannot quantify how much water She brought all her medications with her but she is only aware of mechanisms of action of a few medications that she takes She does not know why she is prescribed Mobic She denies joint pain She cannot recall when was the last time she has a cholesterol level checked.  Her last TSH was checked probably about 9 months ago She has been seeing her radiation oncologist (she thought he is a neurologist) and GYN oncologist for care extensively over the past few months.  MEDICAL HISTORY:  Past Medical  History:  Diagnosis Date  . Anemia   . Atrophic vaginitis 09/29/2012  . Brain cancer ([place])    metastasis  . Degenerative, intervertebral disc, cervical   . Diverticulosis   . Fallopian tube cancer, carcinoma ([facility]) 11/02/2018  . GERD (gastroesophageal reflux disease)   . Glaucoma    left eye  . Hx of migraines   . Hyperlipidemia   . Hypertension   . Injury of right rotator cuff   . Kidney stones   . 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 Migraine   . Osteoporosis 10/18/2011  . Ovarian cancer ([place])   . Thyroid disease    hypothyroid    SURGICAL HISTORY: Past Surgical History:  Procedure Laterality Date  . ABDOMINAL HYSTERECTOMY     1987  . BILATERAL SALPINGOOPHORECTOMY     07-22-2017  . CHOLECYSTECTOMY  08/02/2017  . COLONOSCOPY  08/2015  . EYE SURGERY Left 2004   Left eye wrikleremoval  . NOSE SURGERY  09/05/2014   Fractured Nose   . Plastic reconstructive      Scar tissue left breast at age 40  . TONSILLECTOMY AND ADENOIDECTOMY      SOCIAL HISTORY: Social History   Socioeconomic History  . Marital status: Married    Spouse name: Not on file  . Number of children: Not on file  . Years of education: Not on file  . Highest education level: Not on file  Occupational History  . Not on file  Tobacco Use  . Smoking status: Former Smoker     Packs/day: 0.25    Years: 4.00    Pack years: 1.00    Types: Cigarettes    Quit date: 03/07/1965    Years since quitting: 56.0  . Smokeless tobacco: Never Used  Vaping Use  . Vaping Use: Never used  Substance and Sexual Activity  . Alcohol use: Yes    Comment: occasionally  . Drug use: Never  . Sexual activity: Not Currently  Other Topics Concern  . Not on file  Social History Narrative  . Not on file   Social Determinants of Health   Financial Resource Strain: Not on file  Food Insecurity: Not on file  Transportation Needs: Not on file  Physical Activity: Not on file  Stress: Not on file  Social Connections: Not on file  Intimate Partner Violence: Not on file    FAMILY HISTORY: Family History  Problem Relation Age of Onset  . Hypertension Mother   . Congestive Heart Failure Mother   . Hypercholesterolemia Mother   . Macular degeneration Father   . Non-Hodgkin's lymphoma Brother   . Colon cancer Neg Hx   . Ovarian cancer Neg Hx   . Breast cancer Neg Hx   . Endometrial cancer Neg Hx   . Pancreatic cancer Neg Hx   . Prostate cancer Neg Hx     ALLERGIES:  is allergic to [employer] tartrate], cephalexin, cefaclor, noroxin [norfloxacin], and scallops [shellfish allergy].  MEDICATIONS:  Current Outpatient Medications  Medication Sig Dispense Refill  . polyethylene glycol (MIRALAX / GLYCOLAX) 17 g packet Take 17 g by mouth daily.    . Sennosides (SENNA) 8.6 MG CAPS Take 2 capsules by mouth 2 (two) times daily.    . Cholecalciferol (VITAMIN D3) 50 MCG (2000 UT) TABS Take 1 tablet by mouth daily.    Marland Kitchen dexamethasone (DECADRON) 4 MG tablet Take 2 mg by mouth 2 (two) times daily with a meal. Takig 2 mg every other day.    Marland Kitchen  docusate sodium (COLACE) 100 MG capsule Take 100 mg by mouth 2 (two) times daily.    . fluticasone (VERAMYST) 27.5 MCG/SPRAY nasal spray Place 2 sprays into the nose daily.    Marland Kitchen guaiFENesin (MUCINEX) 600 MG 12 hr tablet Take by mouth daily.     Marland Kitchen levothyroxine (SYNTHROID) 50 MCG tablet Take 50 mcg by mouth daily before breakfast.    . LORazepam (ATIVAN) 1 MG tablet Take 1 mg by mouth at bedtime.    . Melatonin 10 MG TABS Take by mouth.    . mirabegron ER (MYRBETRIQ) 50 MG TB24 tablet Take 50 mg by mouth daily.    . niraparib tosylate (ZEJULA) 100 MG capsule Zejula 100 mg capsule  Take 2 capsules every day by oral route as directed.    . pantoprazole (PROTONIX) 40 MG tablet Take 40 mg by mouth at bedtime.    . propranolol (INNOPRAN XL) 120 MG 24 hr capsule Take 120 mg by mouth in the morning and at bedtime.    . timolol (TIMOPTIC-XR) 0.25 % ophthalmic gel-forming Place 1 drop into both eyes daily.     No current facility-administered medications for this visit.    REVIEW OF SYSTEMS:   Constitutional: Denies fevers, chills or abnormal night sweats Eyes: Denies blurriness of vision, double vision or watery eyes Ears, nose, mouth, throat, and face: Denies mucositis or sore throat Respiratory: Denies cough, dyspnea or wheezes Cardiovascular: Denies palpitation, chest discomfort or lower extremity swelling Skin: Denies abnormal skin rashes Lymphatics: Denies new lymphadenopathy or easy bruising Behavioral/Psych: Mood is stable, no new changes  All other systems were reviewed with the patient and are negative.  PHYSICAL EXAMINATION: ECOG PERFORMANCE STATUS: 2 - Symptomatic, <50% confined to bed  Vitals:   03/12/21 0800  BP: 129/73  Pulse: 84  Resp: 17  Temp: 97.7 F (36.5 C)  SpO2: 99%   Filed Weights   03/12/21 0800  Weight: 149 lb 1.6 oz (67.6 kg)    GENERAL:alert, no distress and comfortable.  Noted alopecia SKIN: skin color, texture, turgor are normal, no rashes or significant lesions EYES: normal, conjunctiva are pink and non-injected, sclera clear OROPHARYNX:no exudate, no erythema and lips, buccal mucosa, and tongue normal  NECK: supple, thyroid normal size, non-tender, without nodularity LYMPH:  no palpable  lymphadenopathy in the cervical, axillary or inguinal LUNGS: clear to auscultation and percussion with normal breathing effort HEART: regular rate & rhythm and no murmurs with mild trace lower extremity edema ABDOMEN:abdomen soft, non-tender and reduced bowel sounds Musculoskeletal:no cyanosis of digits and no clubbing  PSYCH: alert & oriented x 3 with fluent speech NEURO: no focal motor/sensory deficits  LABORATORY DATA:  I have reviewed the data as listed Lab Results  Component Value Date   WBC 5.3 03/12/2021   HGB 12.3 03/12/2021   HCT 34.9 (L) 03/12/2021   MCV 92.8 03/12/2021   PLT 226 03/12/2021   No results for input(s): NA, K, CL, CO2, GLUCOSE, BUN, CREATININE, CALCIUM, GFRNONAA, GFRAA, PROT, ALBUMIN, AST, ALT, ALKPHOS, BILITOT, BILIDIR, IBILI in the last 8760 hours.

## 2021-03-12 NOTE — Assessment & Plan Note (Signed)
The patient has been told and she is fully aware of her poor prognosis but yet she is interested for more treatment She does not know why and what most of her medications are being used for I removed her medication for migraine, her diuretic therapy, Mobic and Zocor from her medication list to minimize risk of side effects We discussed goals of care, prognosis and advanced directives The patient desire to be resuscitated She enlisted her husband, her son and her daughter as healthcare power of attorney and trust them to make medical decision in the event that she cannot make medical decision for herself While she has realistic expectation of prognosis, I believe her reasonable performance status right now is a result of her medications At this point in time, I do not recommend dexamethasone taper

## 2021-03-12 NOTE — Assessment & Plan Note (Signed)
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

## 2021-03-12 NOTE — Assessment & Plan Note (Signed)
I have gone through over 150 pages of outside records and summarize her oncologic history It appears that the patient had progressive CNS disease with leptomeningeal involvement of her brain and spinal cord in the past few months, despite being on pembrolizumab with niraparib In my opinion, this constitutes progression of disease and she should not continue pembrolizumab with niraparib The patient has poor understanding of her treatment program process She thought she was only on Zejula for short period of time but according to documented outside records, she has been on Zejula since last year The patient desire further treatment because she has reasonable performance status I felt that her baseline performance status is not great and she likely feel good because she is still on dexamethasone 2 mg twice daily  Her case was presented at the GYN oncology tumor board At present time, her overall prognosis is poor and I do not support her decision to continue on palliative treatment However, to give her the benefit of the doubt, I will refer her to see one of her neuro oncologist for an opinion If our neuro oncologist felt she could benefit from other forms of treatment with reasonable success, I will support her decision for further treatment Overall, I recommend the patient to consider focusing on palliative care only but she is not at the point of stopping treatment yet  Her last set of blood work was over a month ago Today, I will order blood work for further evaluation I have discontinued a lot of other and necessary medications of her medication list today and suggest she establish with a new primary care doctor for management

## 2021-03-12 NOTE — Assessment & Plan Note (Signed)
According to documentation from her radiation oncologist, she likely have progression of neurological deficit and I suspect that is causing her bowels and bladder dysfunction I recommend aggressive laxative therapy

## 2021-03-12 NOTE — Assessment & Plan Note (Signed)
She is profoundly constipated I will check a TSH level today

## 2021-03-13 ENCOUNTER — Telehealth: Payer: Self-pay | Admitting: *Deleted

## 2021-03-13 ENCOUNTER — Ambulatory Visit: Payer: Medicare Other | Attending: Hematology and Oncology | Admitting: Physical Therapy

## 2021-03-13 ENCOUNTER — Telehealth: Payer: Self-pay | Admitting: Hematology and Oncology

## 2021-03-13 ENCOUNTER — Encounter: Payer: Self-pay | Admitting: Physical Therapy

## 2021-03-13 DIAGNOSIS — M6281 Muscle weakness (generalized): Secondary | ICD-10-CM | POA: Diagnosis present

## 2021-03-13 DIAGNOSIS — C569 Malignant neoplasm of unspecified ovary: Secondary | ICD-10-CM

## 2021-03-13 DIAGNOSIS — R296 Repeated falls: Secondary | ICD-10-CM | POA: Insufficient documentation

## 2021-03-13 DIAGNOSIS — R262 Difficulty in walking, not elsewhere classified: Secondary | ICD-10-CM | POA: Diagnosis not present

## 2021-03-13 LAB — CA 125: Cancer Antigen (CA) 125: 45.8 U/mL — ABNORMAL HIGH (ref 0.0–38.1)

## 2021-03-13 NOTE — Telephone Encounter (Signed)
I spoke with the patient over the telephone She has increased her laxative and have no bowel movement yet since last week She is not feeling uncomfortable She will continue on her laxatives I reviewed test results with her Her thyroid function tests are within acceptable range Her mild hypokalemia and elevated creatinine could be related to her recent diuretic which was since discontinued She is advised to drink more water  I spoke with her again and verified the timing of her dosage of niraparib According to the patient, she has not been on niraparib since last year She is just finishing the first bottle of niraparib even though documentation from outside, from her oncologist office reviewed that she has been on niraparib since November last year  For now, recommend she continues her treatment until recommendation from neuro oncologist next week

## 2021-03-13 NOTE — Therapy (Addendum)
[place], [place], [address] Phone: [phone]   Fax:  [phone]  Physical Therapy Evaluation  Patient Details  Name: Connie Sharp MRN: [id number] Date of Birth: [birth year] Referring Provider (PT): Alvy Bimler   Encounter Date: 03/13/2021   PT End of Session - 03/13/21 1149    Visit Number 1    Number of Visits 9    Date for PT Re-Evaluation 04/17/21   will begin in 1 wk due to scheduling   PT Start Time 1104    PT Stop Time 1145    PT Time Calculation (min) 41 min    Activity Tolerance Patient tolerated treatment well    Behavior During Therapy [facility] for tasks assessed/performed           Past Medical History:  Diagnosis Date  . Anemia   . Atrophic vaginitis 09/29/2012  . 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 Degenerative, intervertebral disc, cervical   . Diverticulosis   . 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 GERD (gastroesophageal reflux disease)   . Glaucoma    left eye  . Hx of migraines   . Hyperlipidemia   . Hypertension   . Injury of right rotator cuff   . Kidney stones   . 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 Migraine   . Osteoporosis 10/18/2011  . Ovarian cancer ([place])   . Thyroid disease    hypothyroid    Past Surgical History:  Procedure Laterality Date  . ABDOMINAL HYSTERECTOMY     1987  . BILATERAL SALPINGOOPHORECTOMY     07-22-2017  . CHOLECYSTECTOMY  08/02/2017  . COLONOSCOPY  08/2015  . EYE SURGERY Left 2004   Left eye wrikleremoval  . NOSE SURGERY  09/05/2014   Fractured Nose   . Plastic reconstructive      Scar tissue left breast at age 17  . TONSILLECTOMY AND ADENOIDECTOMY      There were no vitals filed for this visit.    Subjective Assessment - 03/13/21 1107    Subjective Both of my feet are totally numb from the medication. I use my cane to walk. I want to be able to continue to make the bed and do things. I do not want  my family to do it if I am able.    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  She had abdominal paracentesis at the time; 04/28/2017 - 06/10/2017 Chemotherapy-3 cycles of neoadjuvant chemo with carboplatin and paclitaxel in [place]; 07/22/2017- exploratory laparotomy and interval cytoreductive surgery; 08/26/2017 - 10/07/2017-received 3 more cycles of postoperative chemotherapy with carboplatin and paclitaxel; 02/01/2019-PET CT scan showed brain metastasis and cardio phrenic lymph node; 02/18/2019-neurosurgical resection of brain mass and postoperative radiation therapy.  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; 06/02/2019-PET CT scan showed cardiophrenic lymph node unchanged; 08/30/2019-MRI late September showed tumor in the roof of the left orbit; 09/06/2019 PET CT scan abnormalities on the left orbital tumor and stable cardiophrenic nodule; 10/04/2019-underwent radiosurgery for orbital tumor; 11/08/2019-MRI showed reduce in size of the size of left orbital tumor; 02/07/2020-Imaging enlarging brain metastasis and receive further radiation treatment; 02/22/2020 - 03/14/2020-Radiation to left cerebellar lesion, retroclival lesion; 05/12/2020 MRI and CT scan of the chest, abdomen and pelvis showed disease stability; 08/10/2020- CT imaging was reported as stable.  Brain MRI was significant for new infratentorial  compartment metastasis; 10/02/2020 -  Radiation- Date of treatment is unknown but she received further radiation therapy to at least 4 lesions; 10/12/2020 - 02/22/2021-Chemotherapy- concurrent Zejula at 200 mg daily with pembrolizumab; 01/10/2021-MRI brain done elsewhere showed new leptomeningeal enhancement of the inferior anterior lateral left frontal lobe, new nodular enhancement of the left anterior cingulate gyrus, increased size of nodular  leptomeningeal enhancement of bilateral trigeminal nerves, right superior cerebellar and anterior callosal enhancing lesions have increased in size and left vent trolled lateral cerebellar leptomeningeal enhancement is similar compared to before CT imaging shows stability of cardiophrenic lymph node 01/17/2021 - 01/31/2021- Radiation to whole brain, 30 Gy in 10 fractions; 02/26/2021-MRI spine showed 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 Yes    Pain Score 3     Pain Location Wrist    Pain Orientation Left    Pain Descriptors / Indicators Discomfort    Pain Type Acute pain    Pain Onset 1 to 4 weeks ago    Pain Frequency Intermittent    Aggravating Factors  turning hand    Pain Relieving Factors wearing brace    Effect of Pain on Daily Activities does not limit ADLs              [facility] PT Assessment - 03/13/21 0001      Assessment   Medical Diagnosis fallopian tube cancer    Referring Provider (PT) Alvy Bimler    Onset Date/Surgical Date 04/01/17    Hand Dominance Right    Prior Therapy none      Precautions   Precautions Other (comment)    Precaution Comments mets to spine and brain, fractured L wrist      Restrictions   Weight Bearing Restrictions No      Balance Screen   Has the patient fallen in the past 6 months Yes    How many times? 1   went to get out of bed and fell - has neuropathy in feet   Has the patient had a decrease in activity level because of a fear of falling?  Yes    Is the patient reluctant to leave their home because of a fear of falling?  No      Home Environment   Living Environment Private residence    Living Arrangements Spouse/significant other    Available Help at Discharge Family    Type of Home Other(Comment)   [place] Access Level entry    [place] One level      Prior Function   Level of  Independence Independent with basic ADLs;Independent with community mobility with device    Vocation Retired    Leisure pt currently does not exercise      Cognition   Overall Cognitive Status Within Functional Limits for tasks assessed      Functional Tests   Functional tests Sit to Stand      Sit to Stand   Comments 30 sec sit to stand: 6 reps with use of hands on legs - lost balance but able to self correct on 1st rep      Posture/Postural Control   Posture/Postural Control Postural limitations    Postural Limitations Rounded Shoulders      Strength   Right Hip Flexion 4+/5    Right Hip ABduction 4/5    Right  Hip ADduction 5/5    Left Hip Flexion 4+/5    Left Hip ABduction 4/5    Left Hip ADduction 5/5    Right Knee Flexion 5/5    Right Knee Extension 5/5    Left Knee Flexion 5/5    Left Knee Extension 5/5    Right Ankle Dorsiflexion 5/5    Left Ankle Dorsiflexion 5/5      Ambulation/Gait   Ambulation/Gait Yes    Ambulation/Gait Assistance 6: Modified independent (Device/Increase time)    Ambulation Distance (Feet) 15 Feet    Assistive device Small based quad cane    Gait Pattern Step-through pattern;Within Functional Limits    Ambulation Surface Level                      Objective measurements completed on examination: See above findings.               PT Education - 03/13/21 1253    Education Details how to use quad cane correctly    Person(s) Educated Patient    Methods Explanation    Comprehension Verbalized understanding               PT Long Term Goals - 03/13/21 1151      PT LONG TERM GOAL #1   Title Pt will be independent in a home exercise program for long term stretching and strengthening.    Time 4    Period Weeks    Status New    Target Date 04/17/21      PT LONG TERM GOAL #2   Title Pt will be able to sit to stand from a chair 8 time in 30 seconds without use of hands.    Baseline 6 reps with use of UEs     Time 5    Period Weeks    Status New    Target Date 04/17/21      PT LONG TERM GOAL #3   Title Pt will report improved sense of balance as evidenced by decreased reliance on cane for household mobility.    Time 5    Period Weeks    Status New    Target Date 04/17/21                  Plan - 03/13/21 1152    Clinical Impression Statement Pt presents to PT with recent fall that fractured her wrist, difficulty walking and peripheral neuropathy. 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 Pt was diagnosed in 2018 and has undergone several rounds of chemo and radiation. She most recently completed radiation to her brain. Overall pt has good LE strength with some weakness in hip abductors and flexors. She does have difficulty with functional mobility and is unable to stand from a chair without use of UEs for support. She is very motivated to continue to do things around her house without assistance. She recently began using a quad cane for balance. 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 She had a fall when trying to get out of the bed. She reports when she stepped with her left foot she just fell. She did injure her left wrist when this happened. Pt would benefit from skilled PT services to improve overall functional mobility, decrease dependence on an assistive device, improve strength and progress pt towards independence with a home exercise program.    Personal Factors and Comorbidities Comorbidity  1    Comorbidities metastatic cancer    Stability/Clinical Decision Making Stable/Uncomplicated    Clinical Decision Making Low    Rehab Potential Good    PT Frequency 2x / week    PT Duration --   5 weeks - will be on hold first week due to scheduling   PT Treatment/Interventions ADLs/Self Care Home Management;Therapeutic exercise;Therapeutic activities;Balance training;Neuromuscular re-education;Gait  training;Patient/family education;Manual techniques    PT Next Visit Plan NO RESISTANCE - METS; begin LE exercises and assess balance, add goal as needed, high level balance activities    PT Home Exercise Plan practice sit to stand without use of UEs    Consulted and Agree with Plan of Care Patient           Patient will benefit from skilled therapeutic intervention in order to improve the following deficits and impairments:  Pain,Postural dysfunction,Decreased strength,Decreased activity tolerance,Impaired sensation,Difficulty walking,Decreased balance,Decreased mobility  Visit Diagnosis: Difficulty in walking, not elsewhere classified - Plan: PT plan of care cert/re-cert  Repeated falls - Plan: PT plan of care cert/re-cert  Muscle weakness (generalized) - Plan: PT plan of care cert/re-cert  Malignant neoplasm of ovary, unspecified laterality ([place]) - Plan: PT plan of care cert/re-cert     Problem List Patient Active Problem List   Diagnosis Date Noted  . Acquired hypothyroidism 03/12/2021  . 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 Other constipation 03/12/2021  . Goals of care, counseling/discussion 03/12/2021  . 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

## 2021-03-13 NOTE — Telephone Encounter (Signed)
Received call from [facility].  They are sending discs via fedex for Spine and Brain imaging.  Advised we ideally would like to have no later than 03/16/2021 so that it could be reviewed at our tumor board.

## 2021-03-19 ENCOUNTER — Inpatient Hospital Stay: Payer: Medicare Other

## 2021-03-20 ENCOUNTER — Inpatient Hospital Stay (HOSPITAL_BASED_OUTPATIENT_CLINIC_OR_DEPARTMENT_OTHER): Payer: Medicare Other | Admitting: Internal Medicine

## 2021-03-20 ENCOUNTER — Other Ambulatory Visit: Payer: Self-pay | Admitting: *Deleted

## 2021-03-20 ENCOUNTER — Other Ambulatory Visit: Payer: Self-pay

## 2021-03-20 DIAGNOSIS — C569 Malignant neoplasm of unspecified ovary: Secondary | ICD-10-CM | POA: Diagnosis not present

## 2021-03-20 DIAGNOSIS — C7949 Secondary malignant neoplasm of other parts of nervous system: Secondary | ICD-10-CM | POA: Diagnosis not present

## 2021-03-20 MED ORDER — DEXAMETHASONE 1 MG PO TABS: 1.0000 mg | ORAL_TABLET | Freq: Every day | ORAL | 3 refills | Status: DC

## 2021-03-20 NOTE — Progress Notes (Signed)
[facility] [place], [place] [address] [phone]   New Patient Evaluation  Date of Service: 03/20/21 Patient Name: Connie Sharp Patient MRN: [id number] Patient DOB: October 02, 1941 Provider: Ventura Sellers, MD  Identifying Statement:  Connie Sharp is a 80 y.o. female with Leptomeningeal metastases [facility]) who presents for initial consultation and evaluation regarding cancer associated neurologic deficits.    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  She had abdominal paracentesis at the time   04/28/2017 - 06/10/2017 Chemotherapy   She had 3 cycles of neoadjuvant chemo with carboplatin and paclitaxel in [place]       07/22/2017 Surgery   She had exploratory laparotomy and interval cytoreductive surgery   08/26/2017 - 10/07/2017 Chemotherapy   She received 3 more cycles of postoperative chemotherapy with carboplatin and paclitaxel       02/01/2019 PET scan   Outside PET CT scan showed brain metastasis and cardio phrenic lymph node   02/18/2019 Surgery   She underwent neurosurgical resection of brain mass and postoperative radiation therapy.  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   06/02/2019 PET scan   Repeat outside PET CT scan showed cardiophrenic lymph node unchanged.   08/30/2019 Imaging   Outside MRI late September showed tumor in the roof of the left orbit.   09/06/2019 PET scan   October 2020 PET CT scan abnormalities  on the left orbital tumor and stable cardiophrenic nodule.   10/04/2019 Surgery   She underwent radiosurgery for orbital tumor.   11/08/2019 Imaging   Outside follow-up MRI showed reduce in size of the size of left orbital tumor.   02/07/2020 Imaging   She was noted to have enlarging brain metastasis and receive further radiation treatment.   02/22/2020 - 03/14/2020 Radiation Therapy   She received treatment to left cerebellar lesion, retroclival lesion   05/12/2020 Imaging   Outside MRI and CT scan of the chest, abdomen and pelvis showed disease stability.   08/10/2020 Imaging   Outside CT imaging was reported as stable.  Brain MRI was significant for new infratentorial compartment metastasis.   10/02/2020 -  Radiation Therapy   Date of treatment is unknown but she received further radiation therapy to at least 4 lesions.   10/12/2020 - 02/22/2021 Chemotherapy   She has received concurrent Zejula at 200 mg daily with pembrolizumab       01/10/2021 Imaging   MRI brain done elsewhere showed new leptomeningeal enhancement of the inferior anterior lateral left frontal lobe, new nodular enhancement of the left anterior cingulate gyrus, increased size of nodular leptomeningeal enhancement of bilateral trigeminal nerves, right superior cerebellar and anterior callosal enhancing lesions have increased in size and left vent trolled lateral cerebellar leptomeningeal enhancement is similar compared to before  CT imaging shows stability of cardiophrenic lymph node   01/17/2021 - 01/31/2021 Radiation Therapy   She received whole brain radiation therapy, 30 Gy in 10 fractions   01/31/2021 -  Radiation Therapy   She received whole brain radiation therapy, 30 Gy completed on 01/31/2021  02/21/2021 Tumor Marker   Patient's tumor was tested for the following markers: CA-125 Results of the tumor marker test revealed 58   02/26/2021 Imaging   Outside MRI spine showed leptomeningeal disease in the cervical and  thoracic cord, bulky at T1 with significant epidural lesion in that location   03/12/2021 Cancer Staging   Staging form: Ovary, Fallopian Tube, and Primary Peritoneal Carcinoma, AJCC 8th Edition - Clinical stage from 03/12/2021: Stage IV (rcT3, cN0, pM1) - Signed by Heath Lark, MD on 03/12/2021 Stage prefix: Recurrence   03/12/2021 Tumor Marker   Patient's tumor was tested for the following markers: CA-125 Results of the tumor marker test revealed 45.8     History of Present Illness: The patient's records from the referring physician were obtained and reviewed and the patient interviewed to confirm this HPI.  Connie Sharp presents to clinic today to formulate treatment plan for CNS oncologic disease related to ovarian cancer.  She describes ~6 weeks history of numbness in her legs, left more than right.  It comes and goes, moves up as high as her hip, sometimes just in the foot itself.  She also describes clear decline in her balance, she is now often walking with a cane.  At home, going from room to room, she does not need the cane.  Otherwise denies headaches, seizures, double vision, cognitive impairment, dysuria, saddle numbness.  Plan was for spine radiation in [place], but she has moved to [place] to be closer to her family.  Medications: Current Outpatient Medications on File Prior to Visit  Medication Sig Dispense Refill  . Cholecalciferol (VITAMIN D3) 50 MCG (2000 UT) TABS Take 1 tablet by mouth daily.    Marland Kitchen docusate sodium (COLACE) 100 MG capsule Take 100 mg by mouth 2 (two) times daily.    . fluticasone (VERAMYST) 27.5 MCG/SPRAY nasal spray Place 2 sprays into the nose daily.    Marland Kitchen guaiFENesin (MUCINEX) 600 MG 12 hr tablet Take by mouth daily.    Marland Kitchen levothyroxine (SYNTHROID) 50 MCG tablet Take 50 mcg by mouth daily before breakfast.    . LORazepam (ATIVAN) 1 MG tablet Take 1 mg by mouth at bedtime.    . Melatonin 10 MG TABS Take by mouth.    . mirabegron ER (MYRBETRIQ) 50 MG TB24  tablet Take 50 mg by mouth daily.    . niraparib tosylate (ZEJULA) 100 MG capsule Zejula 100 mg capsule  Take 2 capsules every day by oral route as directed.    . pantoprazole (PROTONIX) 40 MG tablet Take 40 mg by mouth at bedtime.    . polyethylene glycol (MIRALAX / GLYCOLAX) 17 g packet Take 17 g by mouth daily.    . propranolol (INNOPRAN XL) 120 MG 24 hr capsule Take 120 mg by mouth in the morning and at bedtime.    . Sennosides (SENNA) 8.6 MG CAPS Take 2 capsules by mouth 2 (two) times daily.    . timolol (TIMOPTIC-XR) 0.25 % ophthalmic gel-forming Place 1 drop into both eyes daily.     No current facility-administered medications on file prior to visit.    Allergies:  Allergies  Allergen Reactions  . Ambien [Zolpidem Tartrate] Other (See Comments)    Delirium  . Cephalexin Nausea And Vomiting, Hives and Rash  . Cefaclor Other (See Comments) and Hives    unknown  . Noroxin [Norfloxacin] Other (See Comments)    unknown  . Scallops [Shellfish Allergy] Nausea And Vomiting and Swelling   Past Medical History:  Past Medical History:  Diagnosis Date  . Anemia   . Atrophic vaginitis 09/29/2012  . Brain cancer ([place])    metastasis  . Degenerative, intervertebral disc, cervical   . Diverticulosis   . Fallopian tube cancer, carcinoma ([place]) 11/02/2018  . GERD (gastroesophageal reflux disease)   . Glaucoma    left eye  . Hx of migraines   . Hyperlipidemia   . Hypertension   . Injury of right rotator cuff   . Kidney stones   . 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 Migraine   . Osteoporosis 10/18/2011  . Ovarian cancer ([place])   . Thyroid disease    hypothyroid   Past Surgical History:  Past Surgical History:  Procedure Laterality Date  . ABDOMINAL HYSTERECTOMY     1987  . BILATERAL SALPINGOOPHORECTOMY     07-22-2017  . CHOLECYSTECTOMY  08/02/2017  . COLONOSCOPY  08/2015  . EYE SURGERY Left 2004   Left eye wrikleremoval  . NOSE SURGERY  09/05/2014   Fractured Nose    . Plastic reconstructive      Scar tissue left breast at age 89  . TONSILLECTOMY AND ADENOIDECTOMY     Social History:  Social History   Socioeconomic History  . Marital status: Married    Spouse name: Not on file  . Number of children: Not on file  . Years of education: Not on file  . Highest education level: Not on file  Occupational History  . Not on file  Tobacco Use  . Smoking status: Former Smoker    Packs/day: 0.25    Years: 4.00    Pack years: 1.00    Types: Cigarettes    Quit date: 03/07/1965    Years since quitting: 56.0  . Smokeless tobacco: Never Used  Vaping Use  . Vaping Use: Never used  Substance and Sexual Activity  . Alcohol use: Yes    Comment: occasionally  . Drug use: Never  . Sexual activity: Not Currently  Other Topics Concern  . Not on file  Social History Narrative  . Not on file   Social Determinants of Health   Financial Resource Strain: Not on file  Food Insecurity: Not on file  Transportation Needs: Not on file  Physical Activity: Not on file  Stress: Not on file  Social Connections: Not on file  Intimate Partner Violence: Not on file   Family History:  Family History  Problem Relation Age of Onset  . Hypertension Mother   . Congestive Heart Failure Mother   . Hypercholesterolemia Mother   . Macular degeneration Father   . Non-Hodgkin's lymphoma Brother   . Colon cancer Neg Hx   . Ovarian cancer Neg Hx   . Breast cancer Neg Hx   . Endometrial cancer Neg Hx   . Pancreatic cancer Neg Hx   . Prostate cancer Neg Hx     Review of Systems: Constitutional: Doesn't report fevers, chills or abnormal weight loss Eyes: Doesn't report blurriness of vision Ears, nose, mouth, throat, and face: Doesn't report sore throat Respiratory: Doesn't report cough, dyspnea or wheezes Cardiovascular: Doesn't report palpitation, chest discomfort  Gastrointestinal:  Doesn't report nausea, constipation, diarrhea GU: Doesn't report  incontinence Skin: Doesn't report skin rashes Neurological: Per HPI Musculoskeletal: Doesn't report joint pain Behavioral/Psych: Doesn't report anxiety  Physical Exam: Vitals:   03/20/21 1026  BP: (!) 141/84  Pulse: 78  Resp: 19  Temp: (!) 97.1 F (36.2 C)  SpO2: 99%   KPS: 70. General: Alert,  cooperative, pleasant, in no acute distress Head: Normal EENT: No conjunctival injection or scleral icterus.  Lungs: Resp effort normal Cardiac: Regular rate Abdomen: Non-distended abdomen Skin: No rashes cyanosis or petechiae. Extremities: No clubbing or edema  Neurologic Exam: Mental Status: Awake, alert, attentive to examiner. Oriented to self and environment. Language is fluent with intact comprehension.  Cranial Nerves: Visual acuity is grossly normal. Visual fields are full. Extra-ocular movements intact. No ptosis. Face is symmetric Motor: Tone and bulk are normal. Power is full in both arms and legs. Reflexes are decreased, no pathologic reflexes present.  Sensory: Impaired in stocking pattern Gait: Wide based gait  Labs: I have reviewed the data as listed    Component Value Date/Time   NA 142 03/12/2021 0849   K 3.1 (L) 03/12/2021 0849   CL 101 03/12/2021 0849   CO2 28 03/12/2021 0849   GLUCOSE 114 (H) 03/12/2021 0849   BUN 28 (H) 03/12/2021 0849   CREATININE 1.16 (H) 03/12/2021 0849   CALCIUM 8.9 03/12/2021 0849   PROT 6.1 (L) 03/12/2021 0849   ALBUMIN 3.6 03/12/2021 0849   AST 19 03/12/2021 0849   ALT 25 03/12/2021 0849   ALKPHOS 59 03/12/2021 0849   BILITOT 1.7 (H) 03/12/2021 0849   GFRNONAA 49 (L) 03/12/2021 0849   Lab Results  Component Value Date   WBC 5.3 03/12/2021   NEUTROABS 4.3 03/12/2021   HGB 12.3 03/12/2021   HCT 34.9 (L) 03/12/2021   MCV 92.8 03/12/2021   PLT 226 03/12/2021    Imaging:  MR OUTSIDE FILMS SPINE  Result Date: 03/12/2021 This examination belongs to an outside facility and is stored here for comparison purposes only.  Contact  the originating outside institution for any associated report or interpretation.   [facility] Clinician Interpretation: I have personally reviewed the radiological images as listed.  My interpretation, in the context of the patient's clinical presentation, is leptomeningeal focus at C7/T1.  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  She recently completed WBRT in [place], no complications to this point.    She remains relatively intact from functional standpoint, with mainly sensory deficits at this time.  We discussed treatment options for her LMD, including focal radiosurgery for bulky disease.  We did not recommend any intrathecal chemotherapy due to limited data, typically poor efficacy and high potential toxicity.  She is agreeable to this plan.  We also engaged in broader goals of care discussion with her and her son; they will take some time to think about "big picture" goals, including desire for further systemic therapy with Dr. Alvy Bimler.   We spent twenty additional minutes teaching regarding the natural history, biology, and historical experience in the treatment of neurologic complications of cancer.   We appreciate the opportunity to participate in the care of [place].  We would like to see her for follow up in 1 month for further clinical assessment, goals of care discussion.  All questions were answered. The patient knows to call the clinic with any problems, questions or concerns. No barriers to learning were detected.  The total time spent in the encounter was 40 minutes and more than 50% was on counseling and review of test results   Ventura Sellers, MD Medical Director of Neuro-Oncology [facility] [place] 03/20/21 4:56  PM

## 2021-03-21 NOTE — Progress Notes (Signed)
Histology and Location of Primary Cancer: Fallopian tube Cancer diagnosed in May 2018.  METs to brain and cord  Leptomeningeal focus at C7/T1  MRI Spine 02/26/2021: Leptomeningeal disease in the cervical and thoracic cord, bulky at T1 with significant epidural lesion in that location.  She has complained of ~6 weeks history of numbness in her legs, left more than right.  It comes and goes, moves up as high as her hip, sometimes just in the foot itself.  She also describes clear decline in her balance, she is now often walking with a cane.    Past/Anticipated chemotherapy by medical oncology, if any:  Dr. Mickeal Skinner 03/20/2021 -She presents to clinic today to formulate treatment plan for CNS oncologic disease related to Ovarian Cancer. -Plan was for spine radiation in [place], but she has moved to [place] to be closer to her family. -She presents with clinical syndrome localizing broadly to the cord parenchyma and nerve roots. -She remains relatively intact from functional standpoint, with mainly sensory deficits at this time.   -We discussed treatment options for her LMD, including focal radiosurgery for bulky disease.   -We did not recommend any intrathecal chemotherapy due to limited data, typically poor efficacy and high potential toxicity. -We also engaged in broader goals of care discussion with her and her son; they will take some time to think about "big picture" goals, including desire for further systemic therapy with Dr. Alvy Bimler.    Pain on a scale of 0-10 is: None at this time.   Current Decadron regimen, if applicable: 1 mg Daily   Ambulatory status? Walker? Wheelchair?: Ambulatory with a cane  SAFETY ISSUES:  Prior radiation? Postop brain radiation 01/2019, Radiosurgery for orbital tumor 10/04/2019, left cerebellar and retro-clival 3/23-4/13/2021, 10/2020 radiation to at least 4 lesions.  Whole Brain 2021. T1 was treated 02/28/2021 to 8 Gy in 1 fraction with Dr. Weston Brass in  [place].   Pacemaker/ICD? No  Possible current pregnancy? Hysterectomy 1988  Is the patient on methotrexate? No   Current Complaints / other details:   History -Chemo- 3 cycles of neoadjuvant chemo with carboplatin and paclitaxel in [place] 04/28/2017-06/10/2017 -Exploratory laparotomy and interval cytoreductive surgery 07/22/2017 -Chemo- 3 more cycles of postoperative chemotherapy with carboplatin and paclitaxel 08/26/2017-10/07/2017 -PET Scan 02/01/2019 showed brain metastasis and cardiophrenic lymph node. -Resection of brain mass and postoperative radiation therapy.  Pathology consistent with metastatic high grade serous carcinoma 02/18/2019 -MRI showed tumor in the roof of the left orbit 08/30/2019. -Radiosurgery for orbital tumor 10/04/2019. -MRI showed reduction in size of the left orbital tumor 11/08/2019. -02/07/2020 She was noted to have enlarging brain metastasis and received further radiation treatment. -Radiation treatment to the left cerebellar lesion, retro-clival lesion 02/22/2020-03/14/2020. -05/12/2020 Stable disease on MRI and CT CAP. -08/10/2020 CT imaging was reported as stable.  Brain MRI was significant for new infratentorial compartment metastasis. -10/02/2020, received further radiation treatment to at least 4 lesions. -Chemo- Concurrent Zejula at 200 mg daily with pembrolizumab 10/12/2020-02/22/2021. -MRI Brain 01/10/2021 showed new leptomeningeal enhancement of the inferior anterior lateral left frontal lobe, new nodular enhancement of the left anterior cingulate gyrus, increased size of nodular leptomeningeal enhancement of bilateral trigeminal nerves, right superior cerebellar and anterior callosal enhancing lesions have increased in size and left vent trolled lateral cerebellar leptomeningeal enhancement is similar compared to before. -01/17/2021-01/31/2021 she received whole brain radiation therapy, 30 Gy in 10 fractions. -MRI Spine 02/26/2021- Leptomeningeal disease in the  cervical and thoracic cord, bulky at T1 with significant epidural lesion in  that location.

## 2021-03-22 ENCOUNTER — Encounter: Payer: Self-pay | Admitting: Radiation Oncology

## 2021-03-22 ENCOUNTER — Inpatient Hospital Stay
Admission: RE | Admit: 2021-03-22 | Discharge: 2021-03-22 | Disposition: A | Discharge status: Home / Self Care | Payer: Medicare Other | Source: Ambulatory Visit | Attending: Radiation Oncology | Admitting: Radiation Oncology

## 2021-03-22 ENCOUNTER — Ambulatory Visit
Admission: RE | Admit: 2021-03-22 | Discharge: 2021-03-22 | Disposition: A | Discharge status: Home / Self Care | Payer: Medicare Other | Source: Ambulatory Visit | Attending: Radiation Oncology | Admitting: Radiation Oncology

## 2021-03-22 ENCOUNTER — Other Ambulatory Visit: Payer: Self-pay

## 2021-03-22 VITALS — Ht 62.0 in | Wt 149.0 lb

## 2021-03-22 DIAGNOSIS — C7949 Secondary malignant neoplasm of other parts of nervous system: Secondary | ICD-10-CM

## 2021-03-22 DIAGNOSIS — C569 Malignant neoplasm of unspecified ovary: Secondary | ICD-10-CM

## 2021-03-22 NOTE — Progress Notes (Signed)
Radiation Oncology         [phone] ________________________________  Initial Outpatient Consultation - Conducted via telephone due to current COVID-19 concerns for limiting patient exposure  I spoke with the patient to conduct this consult visit via telephone to spare the patient unnecessary potential exposure in the healthcare setting during the current COVID-19 pandemic. The patient was notified in advance and was offered a [place] meeting to allow for face to face communication but unfortunately reported that they did not have the appropriate resources/technology to support such a visit and instead preferred to proceed with a telephone consult.   Name: Connie Sharp        MRN: [id number]  Date of Service: 03/22/2021 DOB: 1941/06/18  CC:Pcp, No  Vaslow, Acey Lav, MD     REFERRING PHYSICIAN: Ventura Sellers, MD   DIAGNOSIS: The primary encounter diagnosis was Malignant neoplasm of ovary, unspecified laterality ([place]). 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   HISTORY OF PRESENT ILLNESS: Connie Sharp is a 80 y.o. female seen at the request of Dr. Mickeal Skinner for diagnosis of metastatic ovarian cancer.  The patient was originally diagnosed with stage III disease of the ovary after undergoing a omental biopsy in 2018.  She received 3 cycles of carbo and Taxol chemotherapy in the neoadjuvant setting followed by interval cytoreduction surgery and adjuvant completion of 3 additional cycles of chemo.  She was followed without evidence of recurrence until Imaging and March 2020 showed disease in the cardiophrenic angle and concerns for brain disease.  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  She received postoperative radiotherapy.  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  Is unclear exactly when this started however she did develop further disease involving the left orbit and radiosurgery was performed.  In March 2021 she was found to have enlarging brain metastases and stereotactic radiation was utilized to the left cerebellum and retroclival lesions.  Her disease below the neck was stable, but imaging showed progression in the brain in September 2021 and she completed additional stereotactic radiation to at least 4 lesions.  There was concern for possible leptomeningeal disease in the left frontal lobe and Nodular disease in the left anterior gyrus as well as increase in nodular leptomeningeal enhancement of bilateral trigeminal nerves, she subsequently underwent whole brain radiation which she completed in March 2022, and MRI of her spine as a result of this showed concerns for disease in the juncture of the cervical and thoracic cord with bulky disease at T1 and significant epidural disease that was treated with single fraction palliative radiotherapy on 02/28/2021.  She spends time in [place] at the end back home in [place], she is contacted by phone today to discuss impression of her disease status overall regarding her leptomeningeal disease of the cord.  Her case was discussed earlier this week in multidisciplinary brain oncology conference, unfortunately it was not known that she had had palliative radiation on 02/28/2021.  We have requested her MRI report prior to that treatment of the spine as well as dosimetry records to determine the extent of her treatment coverage.   PREVIOUS RADIATION THERAPY: Yes   02/28/21:  The T1 vertebral level disease  of the spinal cord was treated to 8 Gy in a single fraction.  01/2021: Whole brain radiation given, details unknown.  10/2020: Stereotactic style radiation to 4 brain lesions, details  unknown  01/2020: Stereotactic style radiation to the left cerebellum and retroclival lesions, details unknown  10/2019: Stereotactic style radiation to the left orbit, details unknown   01/2019: Postop radiotherapy to the brain details unknown at the time of dictation    PAST MEDICAL HISTORY:  Past Medical History:  Diagnosis Date  . Anemia   . Atrophic vaginitis 09/29/2012  . Brain cancer ([place])    metastasis  . Degenerative, intervertebral disc, cervical   . Diverticulosis   . 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 GERD (gastroesophageal reflux disease)   . Glaucoma    left eye  . Hx of migraines   . Hyperlipidemia   . Hypertension   . Injury of right rotator cuff   . Kidney stones   . 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 Migraine   . Osteoporosis 10/18/2011  . Ovarian cancer ([place])   . Thyroid disease    hypothyroid       PAST SURGICAL HISTORY: Past Surgical History:  Procedure Laterality Date  . ABDOMINAL HYSTERECTOMY     1987  . BILATERAL SALPINGOOPHORECTOMY     07-22-2017  . CHOLECYSTECTOMY  08/02/2017  . COLONOSCOPY  08/2015  . EYE SURGERY Left 2004   Left eye wrikleremoval  . NOSE SURGERY  09/05/2014   Fractured Nose   . Plastic reconstructive      Scar tissue left breast at age 28  . TONSILLECTOMY AND ADENOIDECTOMY       FAMILY HISTORY:  Family History  Problem Relation Age of Onset  . Hypertension Mother   . Congestive Heart Failure Mother   . Hypercholesterolemia Mother   . Macular degeneration Father   . Non-Hodgkin's lymphoma Brother   . Colon cancer Neg Hx   . Ovarian cancer Neg Hx   . Breast cancer Neg Hx   . Endometrial cancer Neg Hx   . Pancreatic cancer Neg Hx   . Prostate cancer Neg Hx      SOCIAL HISTORY:  reports that she quit smoking about 56 years ago. Her smoking use included cigarettes. She has a 1.00 pack-year smoking history. She has never used smokeless tobacco. She reports current alcohol use. She  reports that she does not use drugs. The patient is married and spends time in [place] and her home of [place]. 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 She enjoys gardening.   ALLERGIES: Ambien [zolpidem tartrate], Cephalexin, Cefaclor, Noroxin [norfloxacin], and Scallops [shellfish allergy]   MEDICATIONS:  Current Outpatient Medications  Medication Sig Dispense Refill  . Cholecalciferol (VITAMIN D3) 50 MCG (2000 UT) TABS Take 1 tablet by mouth daily.    Marland Kitchen dexamethasone (DECADRON) 1 MG tablet Take 1 tablet (1 mg total) by mouth daily. 30 tablet 3  . docusate sodium (COLACE) 100 MG capsule Take 100 mg by mouth 2 (two) times daily.    . fluticasone (VERAMYST) 27.5 MCG/SPRAY nasal spray Place 2 sprays into the nose daily.    Marland Kitchen guaiFENesin (MUCINEX) 600 MG 12 hr tablet Take by mouth daily.    Marland Kitchen levothyroxine (SYNTHROID) 50 MCG tablet Take 50 mcg by mouth daily before breakfast.    . LORazepam (ATIVAN) 1 MG tablet Take 1 mg by mouth at bedtime.    Marland Kitchen  Melatonin 10 MG TABS Take by mouth.    . mirabegron ER (MYRBETRIQ) 50 MG TB24 tablet Take 50 mg by mouth daily.    . niraparib tosylate (ZEJULA) 100 MG capsule Zejula 100 mg capsule  Take 2 capsules every day by oral route as directed.    . pantoprazole (PROTONIX) 40 MG tablet Take 40 mg by mouth at bedtime.    . polyethylene glycol (MIRALAX / GLYCOLAX) 17 g packet Take 17 g by mouth daily.    . propranolol (INNOPRAN XL) 120 MG 24 hr capsule Take 120 mg by mouth in the morning and at bedtime.    . Sennosides (SENNA) 8.6 MG CAPS Take 2 capsules by mouth 2 (two) times daily.    . timolol (TIMOPTIC-XR) 0.25 % ophthalmic gel-forming Place 1 drop into both eyes daily.     No current facility-administered medications for this encounter.     REVIEW OF SYSTEMS: On review of systems, the patient reports that she is now taking 1 mg of dexamethasone.  She states that initially  weakness and numbness of her lower extremities in the past few months has been bilateral, her left has been more noticeable in the to the leg, she describes that a few weeks ago her left foot all the way up to her left buttock was completely numb, now the level of involvement is below the medial knee and is more intermittent in the leg though it is still persistent in the left foot.  She is only having numbness in her right foot.  She denies any difficulty with bowel or bladder activity or movement, she is now walking without a cane.  She has been very anxious to get back to her home in [place] that she can get back to enjoying activities there including gardening.  She is extremely optimistic and her outlook but also realistic about her disease process.  She states that her quality of life is excellent despite what is been going on as of late.  She hopes to remain aggressive in her outlook.  No other complaints are verbalized.      PHYSICAL EXAM:  Wt Readings from Last 3 Encounters:  03/20/21 149 lb 1.6 oz (67.6 kg)  03/12/21 149 lb 1.6 oz (67.6 kg)  03/08/21 154 lb 3.2 oz (69.9 kg)  Unable to assess given encounter type   ECOG = 1  0 - Asymptomatic (Fully active, able to carry on all predisease activities without restriction)  1 - Symptomatic but completely ambulatory (Restricted in physically strenuous activity but ambulatory and able to carry out work of a light or sedentary nature. For example, light housework, office work)  2 - Symptomatic, <50% in bed during the day (Ambulatory and capable of all self care but unable to carry out any work activities. Up and about more than 50% of waking hours)  3 - Symptomatic, >50% in bed, but not bedbound (Capable of only limited self-care, confined to bed or chair 50% or more of waking hours)  4 - Bedbound (Completely disabled. Cannot carry on any self-care. Totally confined to bed or chair)  5 - Death   Eustace Pen MM, Creech RH, Tormey DC, et al.  [phone]). "Toxicity and response criteria of the [facility]". [place] Oncol. 5 (6): 649-55    LABORATORY DATA:  Lab Results  Component Value Date   WBC 5.3 03/12/2021   HGB 12.3 03/12/2021   HCT 34.9 (L) 03/12/2021   MCV 92.8 03/12/2021   PLT  226 03/12/2021   Lab Results  Component Value Date   NA 142 03/12/2021   K 3.1 (L) 03/12/2021   CL 101 03/12/2021   CO2 28 03/12/2021   Lab Results  Component Value Date   ALT 25 03/12/2021   AST 19 03/12/2021   ALKPHOS 59 03/12/2021   BILITOT 1.7 (H) 03/12/2021      RADIOGRAPHY: MR OUTSIDE FILMS SPINE  Result Date: 03/12/2021 This examination belongs to an outside facility and is stored here for comparison purposes only.  Contact the originating outside institution for any associated report or interpretation.      IMPRESSION/PLAN: 1. 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  Dr. Lisbeth Renshaw discusses her case and reviewed her imaging.  Knowing that she was treated is more reassuring at this time, we have requested dosimetry records from [place]' office in [place].  Pending review of this it appears that she has had a clinical improvement in her symptoms prior to her radiation.  He anticipate that the entire area of the cord on the MRI was treated, and if so would recommend repeat imaging with MRI of the brain and spine in 2 months time.  She is in agreement with this plan and would be discussed in multidisciplinary brain oncology conference.  I will reach out to her once we have received the records and had a chance to review them.    Given current concerns for patient exposure during the COVID-19 pandemic, this encounter was conducted via telephone.  The patient has provided two factor identification and has given verbal consent for this type of encounter and has been advised to only  accept a meeting of this type in a secure network environment. The time spent during this encounter was 60 minutes including preparation, discussion, and coordination of the patient's care. The attendants for this meeting include Blenda Nicely, RN, Dr. Lisbeth Renshaw, Hayden Pedro  and [name].  During the encounter,  Blenda Nicely, RN, Dr. Lisbeth Renshaw, and Hayden Pedro were located at [facility] Radiation Oncology Department.  Mackinzee Roszak was located at home.     The above documentation reflects my direct findings during this shared patient visit. Please see the separate note by Dr. Lisbeth Renshaw on this date for the remainder of the patient's plan of care.    Carola Rhine, [facility]   **Disclaimer: This note was dictated with voice recognition software. Similar sounding words can inadvertently be transcribed and this note may contain transcription errors which may not have been corrected upon publication of note.**

## 2021-03-26 ENCOUNTER — Telehealth: Payer: Self-pay

## 2021-03-26 NOTE — Telephone Encounter (Signed)
Called and given below message. She verbalized understanding and wants to continue medication. Scheduled appt for next Monday 5/2. She is aware of appt time.

## 2021-03-26 NOTE — Telephone Encounter (Signed)
-----   Message from Heath Lark, MD sent at 03/26/2021  8:52 AM EDT ----- Can you call her and ask if she wants to continue on niraparib? If so, she needs follow-up appt with me next week to monitor her labs Schedule labs and see me 20 mins

## 2021-03-27 ENCOUNTER — Other Ambulatory Visit: Payer: Self-pay | Admitting: Hematology and Oncology

## 2021-03-27 ENCOUNTER — Ambulatory Visit: Payer: Medicare Other

## 2021-03-27 ENCOUNTER — Other Ambulatory Visit: Payer: Self-pay

## 2021-03-27 DIAGNOSIS — C569 Malignant neoplasm of unspecified ovary: Secondary | ICD-10-CM

## 2021-03-27 DIAGNOSIS — R262 Difficulty in walking, not elsewhere classified: Secondary | ICD-10-CM | POA: Diagnosis not present

## 2021-03-27 DIAGNOSIS — M6281 Muscle weakness (generalized): Secondary | ICD-10-CM

## 2021-03-27 DIAGNOSIS — R296 Repeated falls: Secondary | ICD-10-CM

## 2021-03-27 NOTE — Patient Instructions (Signed)
Access Code: [email or web address] URL: [email or web address] Date: 03/27/2021 Prepared by: Cheral Almas  Exercises Standing Hip Extension - 1 x daily - 7 x weekly - 1 sets - 10 reps Standing Hip Abduction with Anterior Support - 1 x daily - 7 x weekly -  1 sets - 10 reps Standing Hip Flexion - 1 x daily - 7 x weekly - 1 sets - 10 reps Heel rises with counter support - 1 x daily - 7 x weekly - 1 sets - 10 reps, 1 x 10 without counter support for balance.

## 2021-03-27 NOTE — Therapy (Signed)
[place], [place], [address] Phone: [phone]   Fax:  [phone]  Physical Therapy Treatment  Patient Details  Name: Connie Sharp MRN: [id number] Date of Birth: 28-Dec-1940 Referring Provider (PT): Alvy Bimler   Encounter Date: 03/27/2021   PT End of Session - 03/27/21 1202    Visit Number 2    Number of Visits 9    Date for PT Re-Evaluation 04/17/21    PT Start Time 1101    PT Stop Time 1148    PT Time Calculation (min) 47 min    Equipment Utilized During Treatment Gait belt    Activity Tolerance Patient tolerated treatment well    Behavior During Therapy [facility] for tasks assessed/performed           Past Medical History:  Diagnosis Date  . Anemia   . Atrophic vaginitis 09/29/2012  . Brain cancer ([place])    metastasis  . Degenerative, intervertebral disc, cervical   . Diverticulosis   . 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 GERD (gastroesophageal reflux disease)   . Glaucoma    left eye  . Hx of migraines   . Hyperlipidemia   . Hypertension   . Injury of right rotator cuff   . Kidney stones   . 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 Migraine   . Osteoporosis 10/18/2011  . Ovarian cancer ([place])   . Thyroid disease    hypothyroid    Past Surgical History:  Procedure Laterality Date  . ABDOMINAL HYSTERECTOMY     1987  . BILATERAL SALPINGOOPHORECTOMY     07-22-2017  . CHOLECYSTECTOMY  08/02/2017  . COLONOSCOPY  08/2015  . EYE SURGERY Left 2004   Left eye wrikleremoval  . NOSE SURGERY  09/05/2014   Fractured Nose   . Plastic reconstructive      Scar tissue left breast at age 77  . TONSILLECTOMY AND ADENOIDECTOMY      There were no vitals filed for this visit.   Subjective Assessment - 03/27/21 1057    Subjective My balance does feel better since I changed shoes based on what Blaire said.  I had been wearing crocs and these do feel better with  walking.The numbness in my feet is there but not as bad as it is at night.    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  She had abdominal paracentesis at the time; 04/28/2017 - 06/10/2017 Chemotherapy-3 cycles of neoadjuvant chemo with carboplatin and paclitaxel in [place]; 07/22/2017- exploratory laparotomy and interval cytoreductive surgery; 08/26/2017 - 10/07/2017-received 3 more cycles of postoperative chemotherapy with carboplatin and paclitaxel; 02/01/2019-PET CT scan showed brain metastasis and cardio phrenic lymph node; 02/18/2019-neurosurgical resection of brain mass and postoperative radiation therapy.  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; 06/02/2019-PET CT scan showed cardiophrenic lymph node unchanged; 08/30/2019-MRI late September showed tumor in the roof of the left orbit; 09/06/2019 PET CT scan abnormalities on the left orbital tumor and stable cardiophrenic nodule; 10/04/2019-underwent radiosurgery for orbital tumor; 11/08/2019-MRI showed reduce in size of the size of left orbital tumor; 02/07/2020-Imaging enlarging brain metastasis and receive further radiation treatment; 02/22/2020 - 03/14/2020-Radiation to left cerebellar lesion, retroclival lesion; 05/12/2020 MRI and CT scan of the chest, abdomen and pelvis showed disease stability; 08/10/2020- CT imaging was reported as stable.  Brain MRI was significant for new infratentorial compartment metastasis;  10/02/2020 -  Radiation- Date of treatment is unknown but she received further radiation therapy to at least 4 lesions; 10/12/2020 - 02/22/2021-Chemotherapy- concurrent Zejula at 200 mg daily with pembrolizumab; 01/10/2021-MRI brain done elsewhere showed new leptomeningeal enhancement of the inferior anterior lateral left frontal lobe, new nodular enhancement of the left anterior  cingulate gyrus, increased size of nodular leptomeningeal enhancement of bilateral trigeminal nerves, right superior cerebellar and anterior callosal enhancing lesions have increased in size and left vent trolled lateral cerebellar leptomeningeal enhancement is similar compared to before CT imaging shows stability of cardiophrenic lymph node 01/17/2021 - 01/31/2021- Radiation to whole brain, 30 Gy in 10 fractions; 02/26/2021-MRI spine showed leptomeningeal disease in the cervical and thoracic cord, bulky at T1 with significant epidural lesion in that location    Currently in Pain? Yes    Pain Score 1     Pain Location Wrist    Pain Orientation Left    Pain Descriptors / Indicators Discomfort    Pain Type Acute pain    Pain Onset 1 to 4 weeks ago    Pain Frequency Intermittent                             [facility] Adult PT Treatment/Exercise - 03/27/21 0001      Dynamic Standing Balance   Reaching for objects comments: DLS feet together;reaching for goniomete x 5 B No LOB    Foam balance beam comments: ax stance eyes open 3 x 30 sec   No LOB, PT holding gait belt but without Assist     Knee/Hip Exercises: Standing   Heel Raises 10 reps;Other (comment)   holding counter, 10 reps wihtout holding   Hip Flexion Right;AROM;Left;10 reps   holding counter   Hip Abduction AROM;Right;Left;10 reps   holding counter   Hip Extension AROM;Right;Left;10 reps   holding counter   Functional Squat 1 set;15 reps   mini squat   Functional Squat Limitations VC's for proper form    SLS 3 each with counter support x 10 sec    SLS with Vectors 5 each with 1 hand counter support    Other Standing Knee Exercises marching x 10 on floor, x 10 on ax no hands   PT holding gait belt   Other Standing Knee Exercises sit to stand 2 x 5 from table   arms across chest;slightly higher than chair                 PT Education - 03/27/21 1201    Education Details Pt was educated in 3 D standing hip x 10  ea bilaterally and standing heel raises at counter x 10.    Person(s) Educated Patient    Methods Demonstration;Handout    Comprehension Returned demonstration;Verbalized understanding               PT Long Term Goals - 03/27/21 1208      PT LONG TERM GOAL #1   Title Pt will be independent in a home exercise program for long term stretching and strengthening.    Time 4    Period Weeks    Status New      PT LONG TERM GOAL #2   Title Pt will be able to sit to stand from a chair 8 time in 30 seconds without use of hands.    Baseline 6 reps with use of UEs    Time 5    Period Weeks  Status New      PT LONG TERM GOAL #3   Title Pt will report improved sense of balance as evidenced by decreased reliance on cane for household mobility.    Time 5    Period Weeks    Status New      PT LONG TERM GOAL #4   Title Pt will be able to maintain bilateral tandem stance for atleast 10 seconds each    Baseline left foot forward 3 sec    Time 5    Period Weeks      PT LONG TERM GOAL #5   Title Pt will be able to maintain SLS for 8-10 seconds without LOB    Time 5      Additional Long Term Goals   Additional Long Term Goals Yes      PT LONG TERM GOAL #6   Title    Baseline    Time    Period    Status                 Plan - 03/27/21 1203    Clinical Impression Statement 4 position balance was assessed prior to exercising.  She was able to maintain DLS with feet together and half tandem stance without LOB, but was unable to maintain tandem stance with left foot forward x 10 seconds, and SLS when performed bilaterally was limited to 1-3 seconds.  She did very well with exercises but requires rest periods secondary to getting slightly short winded.  She requires use of hands on counter for most exercises, but when doing unsupported exercises therapist held gait belt for safety. No resistance was used secondary to Bone METS.  hip abductor weakness noted with SLS vector  exercise, and some single leg activities    Personal Factors and Comorbidities Comorbidity 1    Comorbidities metastatic cancer    Stability/Clinical Decision Making Stable/Uncomplicated    Rehab Potential Good    PT Frequency 2x / week    PT Treatment/Interventions ADLs/Self Care Home Management;Therapeutic exercise;Therapeutic activities;Balance training;Neuromuscular re-education;Gait training;Patient/family education;Manual techniques    PT Next Visit Plan NO RESISTANCE - METS; begin LE exercises and assess balance, add goal as needed, high level balance activities, add step ups, mini lunges    PT Home Exercise Plan practice sit to stand without use of UEs, standing 3 D AROM hip, marching, heel raises (all with counter support;1 set of 10 heel raises with hands above counter for balance)    Consulted and Agree with Plan of Care Patient           Patient will benefit from skilled therapeutic intervention in order to improve the following deficits and impairments:  Pain,Postural dysfunction,Decreased strength,Decreased activity tolerance,Impaired sensation,Difficulty walking,Decreased balance,Decreased mobility  Visit Diagnosis: Difficulty in walking, not elsewhere classified  Repeated falls  Muscle weakness (generalized)  Malignant neoplasm of ovary, unspecified laterality [facility])     Problem List Patient Active Problem List   Diagnosis Date Noted  . 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 Acquired hypothyroidism 03/12/2021  . 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 Other constipation 03/12/2021  . Goals of care, counseling/discussion 03/12/2021  . Malignant neoplasm of ovary ([place]) 08/18/2019  . 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, [place], [address] Phone: [phone]   Fax:  [phone]  Name: Ingrid Shifrin MRN:  [id number] Date of Birth: 1940/12/16  Cheral Almas, PT 03/27/21 12:25  PM

## 2021-03-30 ENCOUNTER — Other Ambulatory Visit: Payer: Self-pay

## 2021-03-30 ENCOUNTER — Ambulatory Visit: Payer: Medicare Other

## 2021-03-30 DIAGNOSIS — M6281 Muscle weakness (generalized): Secondary | ICD-10-CM

## 2021-03-30 DIAGNOSIS — R262 Difficulty in walking, not elsewhere classified: Secondary | ICD-10-CM | POA: Diagnosis not present

## 2021-03-30 DIAGNOSIS — C569 Malignant neoplasm of unspecified ovary: Secondary | ICD-10-CM

## 2021-03-30 DIAGNOSIS — R296 Repeated falls: Secondary | ICD-10-CM

## 2021-03-30 NOTE — Therapy (Signed)
[place], [place], [address] Phone: [phone]   Fax:  [phone]  Physical Therapy Treatment  Patient Details  Name: Connie Sharp MRN: [id number] Date of Birth: [birth year] Referring Provider (PT): Alvy Bimler   Encounter Date: 03/30/2021   PT End of Session - 03/30/21 1122    Visit Number 3    Number of Visits 9    Date for PT Re-Evaluation 04/17/21    PT Start Time 7253    PT Stop Time 1050    PT Time Calculation (min) 48 min    Equipment Utilized During Treatment Gait belt    Activity Tolerance Patient tolerated treatment well;Other (comment)   was given several short rest breaks and O2 monitored          Past Medical History:  Diagnosis Date  . Anemia   . Atrophic vaginitis 09/29/2012  . 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 Degenerative, intervertebral disc, cervical   . Diverticulosis   . 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 GERD (gastroesophageal reflux disease)   . Glaucoma    left eye  . Hx of migraines   . Hyperlipidemia   . Hypertension   . Injury of right rotator cuff   . Kidney stones   . 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 Migraine   . Osteoporosis 10/18/2011  . Ovarian cancer ([place])   . Thyroid disease    hypothyroid    Past Surgical History:  Procedure Laterality Date  . ABDOMINAL HYSTERECTOMY     1987  . BILATERAL SALPINGOOPHORECTOMY     07-22-2017  . CHOLECYSTECTOMY  08/02/2017  . COLONOSCOPY  08/2015  . EYE SURGERY Left 2004   Left eye wrikleremoval  . NOSE SURGERY  09/05/2014   Fractured Nose   . Plastic reconstructive      Scar tissue left breast at age 43  . TONSILLECTOMY AND ADENOIDECTOMY      There were no vitals filed for this visit.   Subjective Assessment - 03/30/21 1001    Subjective I did well after last visit, and I wasn't too sore.  I do find I get out of breath some. I am feeling really tired today and off  balance and not as well as I have been doing.  I have done the home exercises 2 times. (pt. 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  She had abdominal paracentesis at the time; 04/28/2017 - 06/10/2017 Chemotherapy-3 cycles of neoadjuvant chemo with carboplatin and paclitaxel in [place]; 07/22/2017- exploratory laparotomy and interval cytoreductive surgery; 08/26/2017 - 10/07/2017-received 3 more cycles of postoperative chemotherapy with carboplatin and paclitaxel; 02/01/2019-PET CT scan showed brain metastasis and cardio phrenic lymph node; 02/18/2019-neurosurgical resection of brain mass and postoperative radiation therapy.  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; 06/02/2019-PET CT scan showed cardiophrenic lymph node unchanged; 08/30/2019-MRI late September showed tumor in the roof of the left orbit; 09/06/2019 PET CT scan abnormalities on the left orbital tumor and stable cardiophrenic nodule; 10/04/2019-underwent radiosurgery for orbital tumor; 11/08/2019-MRI showed reduce in size of the size of left orbital tumor; 02/07/2020-Imaging enlarging brain metastasis and receive further radiation treatment; 02/22/2020 - 03/14/2020-Radiation to left cerebellar lesion, retroclival lesion; 05/12/2020 MRI and CT scan of the chest, abdomen and pelvis showed disease stability; 08/10/2020- CT imaging was reported as  stable.  Brain MRI was significant for new infratentorial compartment metastasis; 10/02/2020 -  Radiation- Date of treatment is unknown but she received further radiation therapy to at least 4 lesions; 10/12/2020 - 02/22/2021-Chemotherapy- concurrent Zejula at 200 mg daily with pembrolizumab; 01/10/2021-MRI brain done elsewhere showed new leptomeningeal enhancement of the inferior anterior lateral left frontal lobe, new nodular  enhancement of the left anterior cingulate gyrus, increased size of nodular leptomeningeal enhancement of bilateral trigeminal nerves, right superior cerebellar and anterior callosal enhancing lesions have increased in size and left vent trolled lateral cerebellar leptomeningeal enhancement is similar compared to before CT imaging shows stability of cardiophrenic lymph node 01/17/2021 - 01/31/2021- Radiation to whole brain, 30 Gy in 10 fractions; 02/26/2021-MRI spine showed leptomeningeal disease in the cervical and thoracic cord, bulky at T1 with significant epidural lesion in that location                             [facility] Adult PT Treatment/Exercise - 03/30/21 0001      Knee/Hip Exercises: Standing   Heel Raises 10 reps;Other (comment)   holding counter, 10 reps wihtout holding   Forward Lunges Right;Left;1 set;10 reps    Lateral Step Up Right;Left;2 sets;5 reps   UE support, 3.5 in step   Forward Step Up Right;Left;2 sets;5 sets   UE hold, 3.5 inch step   Functional Squat 2 sets;10 reps   mini squats holding bike   Functional Squat Limitations Verbal cues and demonstration for proper form    SLS with Vectors 5 each with 1 hand on bike, and 5 without handhold but PT with CGA on gait belt    Other Standing Knee Exercises marching  2 x 10 with hand hold, 2x5 without hand hold CGA by PT on gait belt    Other Standing Knee Exercises sit to stand, 2 x 5 ax in chair, hands across chest      Shoulder Exercises: Seated   Elevation --   AROM only, and fexion   Other Seated Exercises scapular retraction x 10    Other Seated Exercises Jobes flexion and scaption x 10 ea                  PT Education - 03/30/21 1115    Education Details pt was educated in mini squats and sit to stand with pillow in chair to practice at home. We discussed and practiced good form with each.    Methods Explanation;Demonstration;Handout    Comprehension Returned demonstration                PT Long Term Goals - 03/27/21 1208      PT LONG TERM GOAL #1   Title Pt will be independent in a home exercise program for long term stretching and strengthening.    Time 4    Period Weeks    Status New      PT LONG TERM GOAL #2   Title Pt will be able to sit to stand from a chair 8 time in 30 seconds without use of hands.    Baseline 6 reps with use of UEs    Time 5    Period Weeks    Status New      PT LONG TERM GOAL #3   Title Pt will report improved sense of balance as evidenced by decreased reliance on cane for household mobility.    Time 5    Period Weeks  Status New      PT LONG TERM GOAL #4   Title Pt will be able to maintain bilateral tandem stance for atleast 10 seconds each    Baseline left foot forward 3 sec    Time 5    Period Weeks      PT LONG TERM GOAL #5   Title Pt will be able to maintain SLS for 8-10 seconds without LOB    Time 5      Additional Long Term Goals   Additional Long Term Goals Yes      PT LONG TERM GOAL #6   Title Pt will maintain SLS for 8-10 seconds to demonstrate improved balance    Baseline 2-3 sec    Time 5    Period Weeks    Status New                 Plan - 03/30/21 1123    Clinical Impression Statement Pt. reported doing very well after previous visit, but felt tired and off balance when she first got here.  She arrived 25 minutes early and had to wait for treatment. She felt better after exercising and more energetic.  She requires VC's and demonstration for proper mini squat but then did very well.  She did experience several LOB during activities without hand hold but recovered quickly with only occasional PT assist on gait belt for balance.  It was suggested that she have a walker near her bed at night for trips to the restroom as she did lose her balance last night and fell onto the bed. Her O2 was monitored today and after several exercises and checking 02 it was 82% but recovered quickly to 98%, 2nd rest it was 98%,  and next rest 89% quickly recovering to 98%.    Personal Factors and Comorbidities Comorbidity 1    Comorbidities metastatic cancer    Stability/Clinical Decision Making Stable/Uncomplicated    Rehab Potential Good    PT Frequency 2x / week    PT Treatment/Interventions ADLs/Self Care Home Management;Therapeutic exercise;Therapeutic activities;Balance training;Neuromuscular re-education;Gait training;Patient/family education;Manual techniques    PT Next Visit Plan NO RESISTANCE - METS; begin LE exercises and assess balance, add goal as needed, high level balance activities, monitor O2 prn    PT Home Exercise Plan practice sit to stand without use of UEs, standing 3 D AROM hip, marching, heel raises (all with counter support;1 set of 10 heel raises with hands above counter for balance), mini squats    Recommended Other Services advised to get a walker for night time trips to bathroom    Consulted and Agree with Plan of Care Patient           Patient will benefit from skilled therapeutic intervention in order to improve the following deficits and impairments:  Pain,Postural dysfunction,Decreased strength,Decreased activity tolerance,Impaired sensation,Difficulty walking,Decreased balance,Decreased mobility  Visit Diagnosis: Difficulty in walking, not elsewhere classified  Repeated falls  Muscle weakness (generalized)  Malignant neoplasm of ovary, unspecified laterality [facility])     Problem List Patient Active Problem List   Diagnosis Date Noted  . 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 Acquired hypothyroidism 03/12/2021  . 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 Other constipation 03/12/2021  . Goals of care, counseling/discussion 03/12/2021  . 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

## 2021-03-30 NOTE — Patient Instructions (Signed)
Access Code: [email or web address] URL: [email or web address] Date: 03/30/2021 Prepared by: Cheral Almas  Exercises Sit to Stand Without Arm Support - 1 x daily - 7 x weekly - 2 sets - 5 reps Mini Squat with Counter Support - 1 x daily - 7 x weekly - 1 sets - 10 reps

## 2021-04-02 ENCOUNTER — Encounter: Payer: Self-pay | Admitting: Hematology and Oncology

## 2021-04-02 ENCOUNTER — Telehealth: Payer: Self-pay

## 2021-04-02 ENCOUNTER — Inpatient Hospital Stay (HOSPITAL_BASED_OUTPATIENT_CLINIC_OR_DEPARTMENT_OTHER): Payer: Medicare Other | Admitting: Hematology and Oncology

## 2021-04-02 ENCOUNTER — Other Ambulatory Visit: Payer: Self-pay | Admitting: Hematology and Oncology

## 2021-04-02 ENCOUNTER — Other Ambulatory Visit: Payer: Self-pay

## 2021-04-02 ENCOUNTER — Telehealth: Payer: Self-pay | Admitting: Internal Medicine

## 2021-04-02 ENCOUNTER — Inpatient Hospital Stay: Payer: Medicare Other | Attending: Gynecologic Oncology

## 2021-04-02 DIAGNOSIS — K5909 Other constipation: Secondary | ICD-10-CM | POA: Diagnosis not present

## 2021-04-02 DIAGNOSIS — R300 Dysuria: Secondary | ICD-10-CM

## 2021-04-02 DIAGNOSIS — C569 Malignant neoplasm of unspecified ovary: Secondary | ICD-10-CM | POA: Diagnosis present

## 2021-04-02 DIAGNOSIS — Z7952 Long term (current) use of systemic steroids: Secondary | ICD-10-CM | POA: Diagnosis not present

## 2021-04-02 DIAGNOSIS — C7931 Secondary malignant neoplasm of brain: Secondary | ICD-10-CM | POA: Diagnosis present

## 2021-04-02 DIAGNOSIS — D649 Anemia, unspecified: Secondary | ICD-10-CM | POA: Insufficient documentation

## 2021-04-02 DIAGNOSIS — Z923 Personal history of irradiation: Secondary | ICD-10-CM | POA: Diagnosis not present

## 2021-04-02 DIAGNOSIS — D630 Anemia in neoplastic disease: Secondary | ICD-10-CM | POA: Diagnosis not present

## 2021-04-02 DIAGNOSIS — Z79899 Other long term (current) drug therapy: Secondary | ICD-10-CM | POA: Insufficient documentation

## 2021-04-02 DIAGNOSIS — Z9221 Personal history of antineoplastic chemotherapy: Secondary | ICD-10-CM | POA: Diagnosis not present

## 2021-04-02 DIAGNOSIS — Z7189 Other specified counseling: Secondary | ICD-10-CM

## 2021-04-02 LAB — CBC WITH DIFFERENTIAL/PLATELET
Abs Immature Granulocytes: 0.04 10*3/uL (ref 0.00–0.07)
Basophils Absolute: 0 10*3/uL (ref 0.0–0.1)
Basophils Relative: 0 %
Eosinophils Absolute: 0.1 10*3/uL (ref 0.0–0.5)
Eosinophils Relative: 1 %
HCT: 27.1 % — ABNORMAL LOW (ref 36.0–46.0)
Hemoglobin: 9.6 g/dL — ABNORMAL LOW (ref 12.0–15.0)
Immature Granulocytes: 1 %
Lymphocytes Relative: 8 %
Lymphs Abs: 0.4 10*3/uL — ABNORMAL LOW (ref 0.7–4.0)
MCH: 33.9 pg (ref 26.0–34.0)
MCHC: 35.4 g/dL (ref 30.0–36.0)
MCV: 95.8 fL (ref 80.0–100.0)
Monocytes Absolute: 0.3 10*3/uL (ref 0.1–1.0)
Monocytes Relative: 6 %
Neutro Abs: 4.2 10*3/uL (ref 1.7–7.7)
Neutrophils Relative %: 84 %
Platelets: 240 10*3/uL (ref 150–400)
RBC: 2.83 MIL/uL — ABNORMAL LOW (ref 3.87–5.11)
RDW: 16.6 % — ABNORMAL HIGH (ref 11.5–15.5)
WBC: 5 10*3/uL (ref 4.0–10.5)
nRBC: 0 % (ref 0.0–0.2)

## 2021-04-02 LAB — URINALYSIS, COMPLETE (UACMP) WITH MICROSCOPIC
Bilirubin Urine: NEGATIVE
Glucose, UA: NEGATIVE mg/dL
Hgb urine dipstick: NEGATIVE
Ketones, ur: NEGATIVE mg/dL
Nitrite: NEGATIVE
Protein, ur: NEGATIVE mg/dL
Specific Gravity, Urine: 1.015 (ref 1.005–1.030)
WBC, UA: >50 WBC/hpf — ABNORMAL HIGH (ref 0–5)
pH: 7 (ref 5.0–8.0)

## 2021-04-02 LAB — COMPREHENSIVE METABOLIC PANEL
ALT: 17 U/L (ref 0–44)
AST: 16 U/L (ref 15–41)
Albumin: 3.3 g/dL — ABNORMAL LOW (ref 3.5–5.0)
Alkaline Phosphatase: 56 U/L (ref 38–126)
Anion gap: 10 (ref 5–15)
BUN: 14 mg/dL (ref 8–23)
CO2: 27 mmol/L (ref 22–32)
Calcium: 8.7 mg/dL — ABNORMAL LOW (ref 8.9–10.3)
Chloride: 106 mmol/L (ref 98–111)
Creatinine, Ser: 0.9 mg/dL (ref 0.44–1.00)
GFR, Estimated: >60 mL/min (ref >60)
Glucose, Bld: 110 mg/dL — ABNORMAL HIGH (ref 70–99)
Potassium: 3.7 mmol/L (ref 3.5–5.1)
Sodium: 143 mmol/L (ref 135–145)
Total Bilirubin: 1.7 mg/dL — ABNORMAL HIGH (ref 0.3–1.2)
Total Protein: 5.9 g/dL — ABNORMAL LOW (ref 6.5–8.1)

## 2021-04-02 MED ORDER — SULFAMETHOXAZOLE-TRIMETHOPRIM 800-160 MG PO TABS: 1.0000 | ORAL_TABLET | Freq: Two times a day (BID) | ORAL | 0 refills | Status: DC

## 2021-04-02 NOTE — Assessment & Plan Note (Signed)
We discussed prognosis and goals of care today

## 2021-04-02 NOTE — Assessment & Plan Note (Signed)
We discussed the importance of regular laxative therapy 

## 2021-04-02 NOTE — Telephone Encounter (Signed)
-----   Message from Heath Lark, MD sent at 04/02/2021 12:54 PM EDT ----- UA came back abnormal I sent prescription for antibiotics to her pharmacy Tell her we will call her back once full culture results are back

## 2021-04-02 NOTE — Telephone Encounter (Signed)
R/s appt per 5/2 sch msg. Pt aware.  

## 2021-04-02 NOTE — Assessment & Plan Note (Signed)

## 2021-04-02 NOTE — Telephone Encounter (Signed)
Patient notified.  Verbalized understanding and agreement. No further needs at this time.

## 2021-04-02 NOTE — Assessment & Plan Note (Signed)
I will order urinalysis and urine culture to rule out UTI

## 2021-04-02 NOTE — Progress Notes (Signed)
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[place]) She did not feel well today compared to previous visit She has developed anemia, likely caused by side effects of niraparib At this point in time, she does not need dose adjustment or withholding treatment We discussed the risk and benefits of continuing niraparib versus focus on supportive care She is undecided If she wants to continue niraparib, I plan to repeat imaging studies with CT scan of the chest, abdomen and pelvis and MRI of her brain and entire spine end of June or early July to assess response to treatment We discussed the limitation of using niraparib in her situation with somatic BRCA mutation, with metastatic cancer to the brain and leptomeningeal disease  Dysuria I will order urinalysis and urine culture to rule out UTI  Anemia in neoplastic disease This is likely due to recent treatment. The patient denies recent history of bleeding such as epistaxis, hematuria or hematochezia. She is asymptomatic from the anemia. I will observe for now.  She does not require transfusion now. I will continue the chemotherapy at current dose without dosage adjustment.  If the anemia gets progressive worse in the future, I might have to delay her treatment or adjust the chemotherapy dose.   Other constipation We discussed the importance of regular laxative therapy  Goals of care, counseling/discussion We discussed prognosis and goals of care today   Orders Placed This Encounter  Procedures  . Urine Culture    Standing Status:   Future    Standing Expiration Date:   04/02/2022  . Urinalysis, Complete w Microscopic    Standing Status:   Future    Standing Expiration Date:   04/02/2022    All questions were answered. The patient knows to call the clinic with any problems, questions or concerns. The total time spent in the appointment was  30 minutes encounter with patients including review of chart and various tests results, discussions about plan of care and coordination of care plan   Heath Lark, MD 04/02/2021 11:26 AM  INTERVAL HISTORY: Please see below for problem oriented charting. 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)  04/11/2017 Procedure   She was diagnosed after omentum biopsy, confirmed high-grade serous.  She had abdominal paracentesis at the time   04/28/2017 - 06/10/2017 Chemotherapy   She had 3 cycles of neoadjuvant chemo with carboplatin and paclitaxel in [place]       07/22/2017 Surgery   She had exploratory laparotomy and interval cytoreductive surgery   08/26/2017 - 10/07/2017 Chemotherapy   She received 3 more cycles of postoperative chemotherapy with carboplatin and paclitaxel       02/01/2019 PET scan   Outside PET CT scan showed brain metastasis and cardio phrenic lymph node   02/18/2019 Surgery   She underwent neurosurgical  resection of brain mass and postoperative radiation therapy.  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   06/02/2019 PET scan   Repeat outside PET CT scan showed cardiophrenic lymph node unchanged.   08/30/2019 Imaging   Outside MRI late September showed tumor in the roof of the left orbit.   09/06/2019 PET  scan   October 2020 PET CT scan abnormalities on the left orbital tumor and stable cardiophrenic nodule.   10/04/2019 Surgery   She underwent radiosurgery for orbital tumor.   11/08/2019 Imaging   Outside follow-up MRI showed reduce in size of the size of left orbital tumor.   02/07/2020 Imaging   She was noted to have enlarging brain metastasis and receive further radiation treatment.   02/22/2020 - 03/14/2020 Radiation Therapy   She received treatment to left cerebellar lesion, retroclival lesion   05/12/2020 Imaging   Outside MRI and CT scan of the chest, abdomen and pelvis showed disease stability.   08/10/2020 Imaging   Outside CT imaging was reported as stable.  Brain MRI was significant for new infratentorial compartment metastasis.   10/02/2020 -  Radiation Therapy   Date of treatment is unknown but she received further radiation therapy to at least 4 lesions.   10/12/2020 - 02/22/2021 Chemotherapy   She has received concurrent Zejula at 200 mg daily with pembrolizumab       01/10/2021 Imaging   MRI brain done elsewhere showed new leptomeningeal enhancement of the inferior anterior lateral left frontal lobe, new nodular enhancement of the left anterior cingulate gyrus, increased size of nodular leptomeningeal enhancement of bilateral trigeminal nerves, right superior cerebellar and anterior callosal enhancing lesions have increased in size and left vent trolled lateral cerebellar leptomeningeal enhancement is similar compared to before  CT imaging shows stability of cardiophrenic lymph node   01/17/2021 - 01/31/2021 Radiation Therapy   She received whole brain radiation therapy, 30 Gy in 10 fractions   01/31/2021 -  Radiation Therapy   She received whole brain radiation therapy, 30 Gy completed on 01/31/2021   02/21/2021 Tumor Marker   Patient's tumor was tested for the following markers: CA-125 Results of the tumor marker test revealed 58   02/26/2021 Imaging   Outside MRI spine  showed leptomeningeal disease in the cervical and thoracic cord, bulky at T1 with significant epidural lesion in that location   03/12/2021 Cancer Staging   Staging form: Ovary, Fallopian Tube, and Primary Peritoneal Carcinoma, AJCC 8th Edition - Clinical stage from 03/12/2021: Stage IV (rcT3, cN0, pM1) - Signed by Heath Lark, MD on 03/12/2021 Stage prefix: Recurrence   03/12/2021 Tumor Marker   Patient's tumor was tested for the following markers: CA-125 Results of the tumor marker test revealed 45.8     REVIEW OF SYSTEMS:   Constitutional: Denies fevers, chills or abnormal weight loss Eyes: Denies blurriness of vision Ears, nose, mouth, throat, and face: Denies mucositis or sore throat Respiratory: Denies cough, dyspnea or wheezes Cardiovascular: Denies palpitation, chest discomfort or lower extremity swelling Skin: Denies abnormal skin rashes Lymphatics: Denies new lymphadenopathy or easy bruising Neurological:Denies numbness, tingling or new weaknesses Behavioral/Psych: Mood is stable, no new changes  All other systems were reviewed with the patient and are negative.  I have reviewed the past medical history, past surgical history, social history and family history with the patient and they are unchanged from previous note.  ALLERGIES:  is allergic to [employer] tartrate], cephalexin, cefaclor, noroxin [norfloxacin], and scallops [shellfish allergy].  MEDICATIONS:  Current  Outpatient Medications  Medication Sig Dispense Refill  . atorvastatin (LIPITOR) 40 MG tablet Take 40 mg by mouth daily.    . Cholecalciferol (VITAMIN D3) 50 MCG (2000 UT) TABS Take 1 tablet by mouth daily.    Marland Kitchen dexamethasone (DECADRON) 1 MG tablet Take 1 tablet (1 mg total) by mouth daily. 30 tablet 3  . docusate sodium (COLACE) 100 MG capsule Take 100 mg by mouth 2 (two) times daily.    Marland Kitchen doxylamine, Sleep, (UNISOM) 25 MG tablet Take 25 mg by mouth at bedtime as needed.    Marland Kitchen guaiFENesin (MUCINEX) 600  MG 12 hr tablet Take by mouth daily.    Marland Kitchen levothyroxine (SYNTHROID) 50 MCG tablet Take 50 mcg by mouth daily before breakfast.    . LORazepam (ATIVAN) 1 MG tablet Take 1 mg by mouth at bedtime.    . Melatonin 10 MG TABS Take by mouth.    . mirabegron ER (MYRBETRIQ) 50 MG TB24 tablet Take 50 mg by mouth daily.    . niraparib tosylate (ZEJULA) 100 MG capsule Zejula 100 mg capsule  Take 2 capsules every day by oral route as directed.    . pantoprazole (PROTONIX) 40 MG tablet Take 40 mg by mouth at bedtime.    . polyethylene glycol (MIRALAX / GLYCOLAX) 17 g packet Take 17 g by mouth daily.    . propranolol (INNOPRAN XL) 120 MG 24 hr capsule Take 120 mg by mouth in the morning and at bedtime.    . fluticasone (VERAMYST) 27.5 MCG/SPRAY nasal spray Place 2 sprays into the nose daily. (Patient not taking: Reported on 04/02/2021)    . Sennosides (SENNA) 8.6 MG CAPS Take 2 capsules by mouth 2 (two) times daily. (Patient not taking: Reported on 04/02/2021)    . timolol (TIMOPTIC-XR) 0.25 % ophthalmic gel-forming Place 1 drop into both eyes daily. (Patient not taking: Reported on 04/02/2021)     No current facility-administered medications for this visit.    PHYSICAL EXAMINATION: ECOG PERFORMANCE STATUS: 2 - Symptomatic, <50% confined to bed  Vitals:   04/02/21 1004  BP: (!) 120/48  Pulse: 75  Resp: 18  Temp: 98.4 F (36.9 C)  SpO2: 100%   Filed Weights   04/02/21 1004  Weight: 147 lb 9.6 oz (67 kg)    GENERAL:alert, no distress and comfortable NEURO: alert & oriented x 3 with fluent speech, no focal motor/sensory deficits  LABORATORY DATA:  I have reviewed the data as listed    Component Value Date/Time   NA 143 04/02/2021 0945   K 3.7 04/02/2021 0945   CL 106 04/02/2021 0945   CO2 27 04/02/2021 0945   GLUCOSE 110 (H) 04/02/2021 0945   BUN 14 04/02/2021 0945   CREATININE 0.90 04/02/2021 0945   CALCIUM 8.7 (L) 04/02/2021 0945   PROT 5.9 (L) 04/02/2021 0945   ALBUMIN 3.3 (L) 04/02/2021  0945   AST 16 04/02/2021 0945   ALT 17 04/02/2021 0945   ALKPHOS 56 04/02/2021 0945   BILITOT 1.7 (H) 04/02/2021 0945   GFRNONAA >60 04/02/2021 0945    No results found for: SPEP, UPEP  Lab Results  Component Value Date   WBC 5.0 04/02/2021   NEUTROABS 4.2 04/02/2021   HGB 9.6 (L) 04/02/2021   HCT 27.1 (L) 04/02/2021   MCV 95.8 04/02/2021   PLT 240 04/02/2021      Chemistry      Component Value Date/Time   NA 143 04/02/2021 0945   K 3.7 04/02/2021 0945  CL 106 04/02/2021 0945   CO2 27 04/02/2021 0945   BUN 14 04/02/2021 0945   CREATININE 0.90 04/02/2021 0945      Component Value Date/Time   CALCIUM 8.7 (L) 04/02/2021 0945   ALKPHOS 56 04/02/2021 0945   AST 16 04/02/2021 0945   ALT 17 04/02/2021 0945   BILITOT 1.7 (H) 04/02/2021 0945

## 2021-04-02 NOTE — Assessment & Plan Note (Signed)
She did not feel well today compared to previous visit She has developed anemia, likely caused by side effects of niraparib At this point in time, she does not need dose adjustment or withholding treatment We discussed the risk and benefits of continuing niraparib versus focus on supportive care She is undecided If she wants to continue niraparib, I plan to repeat imaging studies with CT scan of the chest, abdomen and pelvis and MRI of her brain and entire spine end of June or early July to assess response to treatment We discussed the limitation of using niraparib in her situation with somatic BRCA mutation, with metastatic cancer to the brain and leptomeningeal disease 

## 2021-04-04 ENCOUNTER — Telehealth: Payer: Self-pay

## 2021-04-04 ENCOUNTER — Ambulatory Visit: Payer: Medicare Other | Attending: Hematology and Oncology | Admitting: Physical Therapy

## 2021-04-04 ENCOUNTER — Other Ambulatory Visit: Payer: Self-pay | Admitting: Hematology and Oncology

## 2021-04-04 ENCOUNTER — Other Ambulatory Visit: Payer: Self-pay

## 2021-04-04 DIAGNOSIS — R262 Difficulty in walking, not elsewhere classified: Secondary | ICD-10-CM | POA: Diagnosis not present

## 2021-04-04 DIAGNOSIS — C569 Malignant neoplasm of unspecified ovary: Secondary | ICD-10-CM | POA: Diagnosis present

## 2021-04-04 DIAGNOSIS — R296 Repeated falls: Secondary | ICD-10-CM

## 2021-04-04 DIAGNOSIS — R300 Dysuria: Secondary | ICD-10-CM

## 2021-04-04 DIAGNOSIS — M6281 Muscle weakness (generalized): Secondary | ICD-10-CM | POA: Diagnosis present

## 2021-04-04 LAB — URINE CULTURE: Culture: >=100000 — AB

## 2021-04-04 NOTE — Telephone Encounter (Signed)
Patient notified, verbalized understanding

## 2021-04-04 NOTE — Telephone Encounter (Signed)
-----   Message from Heath Lark, MD sent at 04/04/2021 11:17 AM EDT ----- No, she just recovered from an infection Her disease could be progressive causing her symptoms We will recheck labs next week and see ----- Message ----- From: Rennis Harding, RN Sent: 04/04/2021  11:05 AM EDT To: Heath Lark, MD  Regarding the dysuria patient states she is doing okay, and feeling better.   Patient did say she went to PT today, and they decided to hold due to patient not feeling well.  Patient has increased fatigue, weak, just not feeling well overall.  She said her appetite is not great, but she is trying to drink plenty of fluids.  She is wondering if this is an expectation since she stopped the Niraparib?     ----- Message ----- From: Heath Lark, MD Sent: 04/04/2021  10:37 AM EDT To: Rennis Harding, RN  Can you call and ask how she is doing with the antibiotics?

## 2021-04-04 NOTE — Telephone Encounter (Signed)
Patient notified.  Verbalized understanding.  RN verified upcoming appointments with pt.

## 2021-04-04 NOTE — Therapy (Signed)
[place], [place], [address] Phone: [phone]   Fax:  [phone]  Physical Therapy Treatment  Patient Details  Name: Connie Sharp MRN: [id number] Date of Birth: 10/29/1941 Referring Provider (PT): Alvy Bimler   Encounter Date: 04/04/2021   PT End of Session - 04/04/21 1040    Visit Number 4    Number of Visits 9    Date for PT Re-Evaluation 04/17/21    PT Start Time 1006    PT Stop Time 1030    PT Time Calculation (min) 24 min    Activity Tolerance Treatment limited secondary to medical complications (Comment)    Behavior During Therapy [facility] for tasks assessed/performed           Past Medical History:  Diagnosis Date  . Anemia   . Atrophic vaginitis 09/29/2012  . Brain cancer ([place])    metastasis  . Degenerative, intervertebral disc, cervical   . Diverticulosis   . 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 GERD (gastroesophageal reflux disease)   . Glaucoma    left eye  . Hx of migraines   . Hyperlipidemia   . Hypertension   . Injury of right rotator cuff   . Kidney stones   . 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 Migraine   . Osteoporosis 10/18/2011  . Ovarian cancer ([place])   . Thyroid disease    hypothyroid    Past Surgical History:  Procedure Laterality Date  . ABDOMINAL HYSTERECTOMY     1987  . BILATERAL SALPINGOOPHORECTOMY     07-22-2017  . CHOLECYSTECTOMY  08/02/2017  . COLONOSCOPY  08/2015  . EYE SURGERY Left 2004   Left eye wrikleremoval  . NOSE SURGERY  09/05/2014   Fractured Nose   . Plastic reconstructive      Scar tissue left breast at age 56  . TONSILLECTOMY AND ADENOIDECTOMY      There were no vitals filed for this visit.   Subjective Assessment - 04/04/21 1023    Subjective I feel really out of it today. I am being treated for a urinary tract infection. My blood pressure has been really low like 120/40. Therapist took BP today and it  was 114/70.    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  She had abdominal paracentesis at the time; 04/28/2017 - 06/10/2017 Chemotherapy-3 cycles of neoadjuvant chemo with carboplatin and paclitaxel in [place]; 07/22/2017- exploratory laparotomy and interval cytoreductive surgery; 08/26/2017 - 10/07/2017-received 3 more cycles of postoperative chemotherapy with carboplatin and paclitaxel; 02/01/2019-PET CT scan showed brain metastasis and cardio phrenic lymph node; 02/18/2019-neurosurgical resection of brain mass and postoperative radiation therapy.  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; 06/02/2019-PET CT scan showed cardiophrenic lymph node unchanged; 08/30/2019-MRI late September showed tumor in the roof of the left orbit; 09/06/2019 PET CT scan abnormalities on the left orbital tumor and stable cardiophrenic nodule; 10/04/2019-underwent radiosurgery for orbital tumor; 11/08/2019-MRI showed reduce in size of the size of left orbital tumor; 02/07/2020-Imaging enlarging brain metastasis and receive further radiation treatment; 02/22/2020 - 03/14/2020-Radiation to left cerebellar lesion, retroclival lesion; 05/12/2020 MRI and CT scan of the chest, abdomen and pelvis showed disease stability; 08/10/2020- CT imaging was reported as stable.  Brain MRI was significant for new infratentorial compartment metastasis; 10/02/2020 -  Radiation- Date of treatment is unknown but she received further radiation therapy  to at least 4 lesions; 10/12/2020 - 02/22/2021-Chemotherapy- concurrent Zejula at 200 mg daily with pembrolizumab; 01/10/2021-MRI brain done elsewhere showed new leptomeningeal enhancement of the inferior anterior lateral left frontal lobe, new nodular enhancement of the left anterior cingulate gyrus, increased size of nodular leptomeningeal enhancement  of bilateral trigeminal nerves, right superior cerebellar and anterior callosal enhancing lesions have increased in size and left vent trolled lateral cerebellar leptomeningeal enhancement is similar compared to before CT imaging shows stability of cardiophrenic lymph node 01/17/2021 - 01/31/2021- Radiation to whole brain, 30 Gy in 10 fractions; 02/26/2021-MRI spine showed 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 No/denies    Pain Score 0-No pain                                     PT Education - 04/04/21 1053    Education Details front wheeled walker vs rollator, need for front wheeled walker for more support, how to obtain a walker and how to adjust to correct height, need to conserve energy when overcoming UTI and feeling so fatigued    Person(s) Educated Patient;Spouse    Methods Explanation    Comprehension Verbalized understanding               PT Long Term Goals - 03/27/21 [place] #1   Title Pt will be independent in a home exercise program for long term stretching and strengthening.    Time 4    Period Weeks    Status New      PT LONG TERM GOAL #2   Title Pt will be able to sit to stand from a chair 8 time in 30 seconds without use of hands.    Baseline 6 reps with use of UEs    Time 5    Period Weeks    Status New      PT LONG TERM GOAL #3   Title Pt will report improved sense of balance as evidenced by decreased reliance on cane for household mobility.    Time 5    Period Weeks    Status New      PT LONG TERM GOAL #4   Title Pt will be able to maintain bilateral tandem stance for atleast 10 seconds each    Baseline left foot forward 3 sec    Time 5    Period Weeks      PT LONG TERM GOAL #5   Title Pt will be able to maintain SLS for 8-10 seconds without LOB    Time 5       Additional Long Term Goals   Additional Long Term Goals Yes      PT LONG TERM GOAL #6   Title Pt will maintain SLS for 8-10 seconds to demonstrate improved balance    Baseline 2-3 sec    Time 5    Period Weeks    Status New                 Plan - 04/04/21 1041    Clinical Impression Statement Pt presents to PT today stating she feels out of it. She reports her blood pressure has been very low (120/40) and that a medication was stopped to  help with this. Today her blood pressure was 114/70.  She is very fatigued and ambulates slowly with straight cane. Pt was diagnosed with a UTI on Monday and is taking antibiotics and still is awaiting results of some urinary tests. Educated pt that since she is feeling so fatigued and is overcoming a urinary tract infection it would be best to hold off on exercises and for pt to rest and spend time with her family. Educated pt about different types of walkers since pt needs more support than a straight cane. Educated pt about a 2 wheeled walker and gave her info on how to obtain and how to adjust walker to correct height. Assisted pt to car with min hand held assist on one hand and straight cane in the other. Educated pt's husband about need for a walker and how to obtain. Cancelled pt's appointment on Friday to give her time to get over her UTI and be less fatigued.    PT Frequency 2x / week    PT Duration --   5 weeks   PT Treatment/Interventions ADLs/Self Care Home Management;Therapeutic exercise;Therapeutic activities;Balance training;Neuromuscular re-education;Gait training;Patient/family education;Manual techniques    PT Next Visit Plan NO RESISTANCE - METS; begin LE exercises and assess balance, add goal as needed, high level balance activities, monitor O2 prn    PT Home Exercise Plan practice sit to stand without use of UEs, standing 3 D AROM hip, marching, heel raises (all with counter support;1 set of 10 heel raises with hands above counter for  balance), mini squats    Consulted and Agree with Plan of Care Patient           Patient will benefit from skilled therapeutic intervention in order to improve the following deficits and impairments:  Pain,Postural dysfunction,Decreased strength,Decreased activity tolerance,Impaired sensation,Difficulty walking,Decreased balance,Decreased mobility  Visit Diagnosis: Difficulty in walking, not elsewhere classified  Repeated falls  Muscle weakness (generalized)  Malignant neoplasm of ovary, unspecified laterality [facility])     Problem List Patient Active Problem List   Diagnosis Date Noted  . Dysuria 04/02/2021  . 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 Acquired hypothyroidism 03/12/2021  . 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 Other constipation 03/12/2021  . Goals of care, counseling/discussion 03/12/2021  . 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

## 2021-04-06 ENCOUNTER — Telehealth: Payer: Self-pay

## 2021-04-06 NOTE — Telephone Encounter (Signed)
Pt called and states Dr Alvy Bimler called in abx for her UTI and she has completed the dose. Pt asks if it is normal to only have 3 days worth. This LPN explained to pt that the abx she was taking is a 3 day supply. Pt states sx have resolved and she just wanted to make sure she did not need more.

## 2021-04-09 ENCOUNTER — Ambulatory Visit: Payer: Medicare Other | Admitting: Physical Therapy

## 2021-04-10 ENCOUNTER — Encounter: Payer: Self-pay | Admitting: Hematology and Oncology

## 2021-04-10 ENCOUNTER — Other Ambulatory Visit: Payer: Self-pay

## 2021-04-10 ENCOUNTER — Inpatient Hospital Stay: Payer: Medicare Other

## 2021-04-10 ENCOUNTER — Inpatient Hospital Stay (HOSPITAL_BASED_OUTPATIENT_CLINIC_OR_DEPARTMENT_OTHER): Payer: Medicare Other | Admitting: Hematology and Oncology

## 2021-04-10 DIAGNOSIS — Z7189 Other specified counseling: Secondary | ICD-10-CM

## 2021-04-10 DIAGNOSIS — R300 Dysuria: Secondary | ICD-10-CM

## 2021-04-10 DIAGNOSIS — C569 Malignant neoplasm of unspecified ovary: Secondary | ICD-10-CM

## 2021-04-10 DIAGNOSIS — D630 Anemia in neoplastic disease: Secondary | ICD-10-CM | POA: Diagnosis not present

## 2021-04-10 DIAGNOSIS — R5383 Other fatigue: Secondary | ICD-10-CM | POA: Insufficient documentation

## 2021-04-10 LAB — COMPREHENSIVE METABOLIC PANEL
ALT: 14 U/L (ref 0–44)
AST: 15 U/L (ref 15–41)
Albumin: 3.4 g/dL — ABNORMAL LOW (ref 3.5–5.0)
Alkaline Phosphatase: 51 U/L (ref 38–126)
Anion gap: 11 (ref 5–15)
BUN: 12 mg/dL (ref 8–23)
CO2: 27 mmol/L (ref 22–32)
Calcium: 8.9 mg/dL (ref 8.9–10.3)
Chloride: 105 mmol/L (ref 98–111)
Creatinine, Ser: 0.88 mg/dL (ref 0.44–1.00)
GFR, Estimated: >60 mL/min (ref >60)
Glucose, Bld: 117 mg/dL — ABNORMAL HIGH (ref 70–99)
Potassium: 3.8 mmol/L (ref 3.5–5.1)
Sodium: 143 mmol/L (ref 135–145)
Total Bilirubin: 1.5 mg/dL — ABNORMAL HIGH (ref 0.3–1.2)
Total Protein: 6 g/dL — ABNORMAL LOW (ref 6.5–8.1)

## 2021-04-10 LAB — CBC WITH DIFFERENTIAL/PLATELET
Abs Immature Granulocytes: 0.32 10*3/uL — ABNORMAL HIGH (ref 0.00–0.07)
Basophils Absolute: 0 10*3/uL (ref 0.0–0.1)
Basophils Relative: 1 %
Eosinophils Absolute: 0 10*3/uL (ref 0.0–0.5)
Eosinophils Relative: 1 %
HCT: 26.4 % — ABNORMAL LOW (ref 36.0–46.0)
Hemoglobin: 9.2 g/dL — ABNORMAL LOW (ref 12.0–15.0)
Immature Granulocytes: 6 %
Lymphocytes Relative: 8 %
Lymphs Abs: 0.4 10*3/uL — ABNORMAL LOW (ref 0.7–4.0)
MCH: 34.5 pg — ABNORMAL HIGH (ref 26.0–34.0)
MCHC: 34.8 g/dL (ref 30.0–36.0)
MCV: 98.9 fL (ref 80.0–100.0)
Monocytes Absolute: 0.4 10*3/uL (ref 0.1–1.0)
Monocytes Relative: 8 %
Neutro Abs: 4.2 10*3/uL (ref 1.7–7.7)
Neutrophils Relative %: 76 %
Platelets: 337 10*3/uL (ref 150–400)
RBC: 2.67 MIL/uL — ABNORMAL LOW (ref 3.87–5.11)
RDW: 20.4 % — ABNORMAL HIGH (ref 11.5–15.5)
WBC: 5.4 10*3/uL (ref 4.0–10.5)
nRBC: 3.3 % — ABNORMAL HIGH (ref 0.0–0.2)

## 2021-04-10 LAB — URINALYSIS, COMPLETE (UACMP) WITH MICROSCOPIC
Bilirubin Urine: NEGATIVE
Glucose, UA: NEGATIVE mg/dL
Hgb urine dipstick: NEGATIVE
Ketones, ur: NEGATIVE mg/dL
Leukocytes,Ua: NEGATIVE
Nitrite: NEGATIVE
Protein, ur: NEGATIVE mg/dL
Specific Gravity, Urine: 1.015 (ref 1.005–1.030)
pH: 5 (ref 5.0–8.0)

## 2021-04-10 NOTE — Progress Notes (Signed)
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 Mickeal Skinner in June for further follow-up and I will follow-up on his recommendation For now, we will continue supportive care and she will remain on 1 mg daily dexamethasone  Anemia in neoplastic disease Her anemia is stable Her symptoms of fatigue has improved Observe closely for now  Dysuria Her symptoms has resolved I plan to repeat urine culture and we will call her with test results  Goals of care, counseling/discussion We have another goals of care discussion She is at peace of not pursuing any active treatment As above, when she returns from [place], I will refer her for home-based palliative care I support her decision to travel to [place]; if she runs into problems, she is encouraged to call me and we can set up virtual visit if needed  Other fatigue The cause of her fatigue is multifactorial Her recent TSH was okay Her anemia is stable I noted that her blood pressure is borderline low I recommend changing her propranolol to once a day   No orders of the defined types were placed in this encounter.   All questions were answered. The patient knows to call the clinic with any problems, questions or concerns. The total time spent in the appointment was 30 minutes encounter with patients including review of chart and various tests results, discussions about plan of care and coordination of care plan   Connie Lark, MD 04/10/2021 2:56 PM  INTERVAL HISTORY: Please see below for problem oriented charting. She returns with her son and husband for further follow-up Since last time I  saw her, she got worse initially before she got better When she is recovered from the UTI and discontinuation of Zejula, her symptoms are almost back to baseline She denies recent falls Her energy level is fair Overall, she felt great today and is ready to travel back to [place] in 2 days time She will return here to [place] on June 1 The trip is somewhat important to her to visit friends and family again SUMMARY OF ONCOLOGIC HISTORY: Oncology History Overview Note  Fallopian tube cancer, high grade serous, original stage IIIc, without significant elevated tumor marker, germline mutation negative, 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  She had abdominal paracentesis at the time   04/28/2017 - 06/10/2017 Chemotherapy   She had 3 cycles of neoadjuvant chemo with carboplatin and paclitaxel in [place]       07/22/2017 Surgery   She had exploratory laparotomy and interval cytoreductive surgery   08/26/2017 - 10/07/2017 Chemotherapy   She received 3 more cycles of postoperative chemotherapy with carboplatin and paclitaxel       02/01/2019 PET scan   Outside PET CT scan showed brain metastasis and cardio phrenic lymph node   02/18/2019 Surgery   She underwent neurosurgical resection of brain mass and postoperative radiation therapy.  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   06/02/2019 PET scan   Repeat outside PET CT scan showed cardiophrenic lymph node  unchanged.   08/30/2019 Imaging   Outside MRI late September showed tumor in the roof of the left orbit.   09/06/2019 PET scan   October 2020 PET CT scan abnormalities on the left orbital tumor and stable cardiophrenic nodule.   10/04/2019 Surgery   She underwent radiosurgery for orbital tumor.   11/08/2019 Imaging   Outside follow-up MRI  showed reduce in size of the size of left orbital tumor.   02/07/2020 Imaging   She was noted to have enlarging brain metastasis and receive further radiation treatment.   02/22/2020 - 03/14/2020 Radiation Therapy   She received treatment to left cerebellar lesion, retroclival lesion   05/12/2020 Imaging   Outside MRI and CT scan of the chest, abdomen and pelvis showed disease stability.   08/10/2020 Imaging   Outside CT imaging was reported as stable.  Brain MRI was significant for new infratentorial compartment metastasis.   10/02/2020 -  Radiation Therapy   Date of treatment is unknown but she received further radiation therapy to at least 4 lesions.   10/12/2020 - 02/22/2021 Chemotherapy   She has received concurrent Zejula at 200 mg daily with pembrolizumab       01/10/2021 Imaging   MRI brain done elsewhere showed new leptomeningeal enhancement of the inferior anterior lateral left frontal lobe, new nodular enhancement of the left anterior cingulate gyrus, increased size of nodular leptomeningeal enhancement of bilateral trigeminal nerves, right superior cerebellar and anterior callosal enhancing lesions have increased in size and left vent trolled lateral cerebellar leptomeningeal enhancement is similar compared to before  CT imaging shows stability of cardiophrenic lymph node   01/17/2021 - 01/31/2021 Radiation Therapy   She received whole brain radiation therapy, 30 Gy in 10 fractions   01/31/2021 -  Radiation Therapy   She received whole brain radiation therapy, 30 Gy completed on 01/31/2021   02/21/2021 Tumor Marker   Patient's tumor was tested for the following markers: CA-125 Results of the tumor marker test revealed 58   02/26/2021 Imaging   Outside MRI spine showed leptomeningeal disease in the cervical and thoracic cord, bulky at T1 with significant epidural lesion in that location   03/12/2021 Cancer Staging   Staging form: Ovary, Fallopian Tube, and Primary Peritoneal  Carcinoma, AJCC 8th Edition - Clinical stage from 03/12/2021: Stage IV (rcT3, cN0, pM1) - Signed by Connie Lark, MD on 03/12/2021 Stage prefix: Recurrence   03/12/2021 Tumor Marker   Patient's tumor was tested for the following markers: CA-125 Results of the tumor marker test revealed 45.8     REVIEW OF SYSTEMS:   Constitutional: Denies fevers, chills or abnormal weight loss Eyes: Denies blurriness of vision Ears, nose, mouth, throat, and face: Denies mucositis or sore throat Respiratory: Denies cough, dyspnea or wheezes Cardiovascular: Denies palpitation, chest discomfort or lower extremity swelling Gastrointestinal:  Denies nausea, heartburn or change in bowel habits Skin: Denies abnormal skin rashes Lymphatics: Denies new lymphadenopathy or easy bruising Neurological:Denies numbness, tingling or new weaknesses Behavioral/Psych: Mood is stable, no new changes  All other systems were reviewed with the patient and are negative.  I have reviewed the past medical history, past surgical history, social history and family history with the patient and they are unchanged from previous note.  ALLERGIES:  is allergic to [employer] tartrate], cephalexin, cefaclor, noroxin [norfloxacin], and scallops [shellfish allergy].  MEDICATIONS:  Current Outpatient Medications  Medication Sig Dispense Refill  . propranolol (INNOPRAN XL) 120 MG 24 hr capsule Take 120 mg by mouth daily.    Marland Kitchen  Cholecalciferol (VITAMIN D3) 50 MCG (2000 UT) TABS Take 1 tablet by mouth daily.    Marland Kitchen dexamethasone (DECADRON) 1 MG tablet Take 1 tablet (1 mg total) by mouth daily. 30 tablet 3  . docusate sodium (COLACE) 100 MG capsule Take 100 mg by mouth 2 (two) times daily.    Marland Kitchen doxylamine, Sleep, (UNISOM) 25 MG tablet Take 25 mg by mouth at bedtime as needed.    . fluticasone (VERAMYST) 27.5 MCG/SPRAY nasal spray Place 2 sprays into the nose daily. (Patient not taking: Reported on 04/02/2021)    . levothyroxine (SYNTHROID) 50  MCG tablet Take 50 mcg by mouth daily before breakfast.    . LORazepam (ATIVAN) 1 MG tablet Take 1 mg by mouth at bedtime.    . Melatonin 10 MG TABS Take by mouth.    . mirabegron ER (MYRBETRIQ) 50 MG TB24 tablet Take 50 mg by mouth daily.    . pantoprazole (PROTONIX) 40 MG tablet Take 40 mg by mouth at bedtime.    . polyethylene glycol (MIRALAX / GLYCOLAX) 17 g packet Take 17 g by mouth daily.    . Sennosides (SENNA) 8.6 MG CAPS Take 2 capsules by mouth 2 (two) times daily. (Patient not taking: Reported on 04/02/2021)    . timolol (TIMOPTIC-XR) 0.25 % ophthalmic gel-forming Place 1 drop into both eyes daily. (Patient not taking: Reported on 04/02/2021)     No current facility-administered medications for this visit.    PHYSICAL EXAMINATION: ECOG PERFORMANCE STATUS: 1 - Symptomatic but completely ambulatory  Vitals:   04/10/21 1110  BP: (!) 124/52  Pulse: 74  Resp: 16  Temp: (!) 97.2 F (36.2 C)  SpO2: 100%   Filed Weights   04/10/21 1110  Weight: 148 lb 3.2 oz (67.2 kg)    GENERAL:alert, no distress and comfortable SNEURO: alert & oriented x 3 with fluent speech, no focal motor/sensory deficits  LABORATORY DATA:  I have reviewed the data as listed    Component Value Date/Time   NA 143 04/10/2021 1047   K 3.8 04/10/2021 1047   CL 105 04/10/2021 1047   CO2 27 04/10/2021 1047   GLUCOSE 117 (H) 04/10/2021 1047   BUN 12 04/10/2021 1047   CREATININE 0.88 04/10/2021 1047   CALCIUM 8.9 04/10/2021 1047   PROT 6.0 (L) 04/10/2021 1047   ALBUMIN 3.4 (L) 04/10/2021 1047   AST 15 04/10/2021 1047   ALT 14 04/10/2021 1047   ALKPHOS 51 04/10/2021 1047   BILITOT 1.5 (H) 04/10/2021 1047   GFRNONAA >60 04/10/2021 1047    No results found for: SPEP, UPEP  Lab Results  Component Value Date   WBC 5.4 04/10/2021   NEUTROABS 4.2 04/10/2021   HGB 9.2 (L) 04/10/2021   HCT 26.4 (L) 04/10/2021   MCV 98.9 04/10/2021   PLT 337 04/10/2021      Chemistry      Component Value Date/Time    NA 143 04/10/2021 1047   K 3.8 04/10/2021 1047   CL 105 04/10/2021 1047   CO2 27 04/10/2021 1047   BUN 12 04/10/2021 1047   CREATININE 0.88 04/10/2021 1047      Component Value Date/Time   CALCIUM 8.9 04/10/2021 1047   ALKPHOS 51 04/10/2021 1047   AST 15 04/10/2021 1047   ALT 14 04/10/2021 1047   BILITOT 1.5 (H) 04/10/2021 1047       RADIOGRAPHIC STUDIES: I have personally reviewed the radiological images as listed and agreed with the findings in the report. No  results found.

## 2021-04-10 NOTE — Assessment & Plan Note (Signed)
She has made informed decision to discontinue niraparib Since then, her energy level has improved She will continue supportive care only When she returns from [place], I plan to refer her to home-based palliative care She has appointment to see Dr. Mickeal Skinner in June for further follow-up and I will follow-up on his recommendation For now, we will continue supportive care and she will remain on 1 mg daily dexamethasone

## 2021-04-10 NOTE — Assessment & Plan Note (Signed)
Her symptoms has resolved I plan to repeat urine culture and we will call her with test results

## 2021-04-10 NOTE — Assessment & Plan Note (Signed)
Her anemia is stable Her symptoms of fatigue has improved Observe closely for now

## 2021-04-10 NOTE — Assessment & Plan Note (Signed)
The cause of her fatigue is multifactorial Her recent TSH was okay Her anemia is stable I noted that her blood pressure is borderline low I recommend changing her propranolol to once a day

## 2021-04-10 NOTE — Assessment & Plan Note (Signed)
We have another goals of care discussion She is at peace of not pursuing any active treatment As above, when she returns from [place], I will refer her for home-based palliative care I support her decision to travel to [place]; if she runs into problems, she is encouraged to call me and we can set up virtual visit if needed

## 2021-04-11 ENCOUNTER — Other Ambulatory Visit: Payer: Self-pay

## 2021-04-11 ENCOUNTER — Telehealth: Payer: Self-pay

## 2021-04-11 ENCOUNTER — Encounter: Payer: Self-pay | Admitting: Rehabilitation

## 2021-04-11 ENCOUNTER — Ambulatory Visit: Payer: Medicare Other | Admitting: Rehabilitation

## 2021-04-11 DIAGNOSIS — R262 Difficulty in walking, not elsewhere classified: Secondary | ICD-10-CM | POA: Diagnosis not present

## 2021-04-11 DIAGNOSIS — C569 Malignant neoplasm of unspecified ovary: Secondary | ICD-10-CM

## 2021-04-11 DIAGNOSIS — M6281 Muscle weakness (generalized): Secondary | ICD-10-CM

## 2021-04-11 DIAGNOSIS — R296 Repeated falls: Secondary | ICD-10-CM

## 2021-04-11 LAB — URINE CULTURE: Culture: NO GROWTH

## 2021-04-11 NOTE — Telephone Encounter (Signed)
Spoke with pt regarding recent lab results per MD Alvy Bimler.

## 2021-04-11 NOTE — Therapy (Signed)
[place], [place], [address] Phone: [phone]   Fax:  [phone]  Physical Therapy Treatment  Patient Details  Name: Connie Sharp MRN: [id number] Date of Birth: [birth year] Referring Provider (PT): Alvy Bimler   Encounter Date: 04/11/2021   PT End of Session - 04/11/21 1105    Visit Number 5    Number of Visits 9    Date for PT Re-Evaluation 04/17/21    PT Start Time 1000    PT Stop Time 1045    PT Time Calculation (min) 45 min    Equipment Utilized During Treatment Gait belt    Activity Tolerance Patient tolerated treatment well    Behavior During Therapy [facility] for tasks assessed/performed           Past Medical History:  Diagnosis Date  . Anemia   . Atrophic vaginitis 09/29/2012  . Brain cancer ([place])    metastasis  . Degenerative, intervertebral disc, cervical   . Diverticulosis   . 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 GERD (gastroesophageal reflux disease)   . Glaucoma    left eye  . Hx of migraines   . Hyperlipidemia   . Hypertension   . Injury of right rotator cuff   . Kidney stones   . 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 Migraine   . Osteoporosis 10/18/2011  . Ovarian cancer ([place])   . Thyroid disease    hypothyroid    Past Surgical History:  Procedure Laterality Date  . ABDOMINAL HYSTERECTOMY     1987  . BILATERAL SALPINGOOPHORECTOMY     07-22-2017  . CHOLECYSTECTOMY  08/02/2017  . COLONOSCOPY  08/2015  . EYE SURGERY Left 2004   Left eye wrikleremoval  . NOSE SURGERY  09/05/2014   Fractured Nose   . Plastic reconstructive      Scar tissue left breast at age 44  . TONSILLECTOMY AND ADENOIDECTOMY      There were no vitals filed for this visit.   Subjective Assessment - 04/11/21 1001    Subjective I am feeling much better. I am flying to [place] tomorrow until 05/02/21 and then meet with Dr. 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  She had abdominal paracentesis at the time; 04/28/2017 - 06/10/2017 Chemotherapy-3 cycles of neoadjuvant chemo with carboplatin and paclitaxel in [place]; 07/22/2017- exploratory laparotomy and interval cytoreductive surgery; 08/26/2017 - 10/07/2017-received 3 more cycles of postoperative chemotherapy with carboplatin and paclitaxel; 02/01/2019-PET CT scan showed brain metastasis and cardio phrenic lymph node; 02/18/2019-neurosurgical resection of brain mass and postoperative radiation therapy.  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; 06/02/2019-PET CT scan showed cardiophrenic lymph node unchanged; 08/30/2019-MRI late September showed tumor in the roof of the left orbit; 09/06/2019 PET CT scan abnormalities on the left orbital tumor and stable cardiophrenic nodule; 10/04/2019-underwent radiosurgery for orbital tumor; 11/08/2019-MRI showed reduce in size of the size of left orbital tumor; 02/07/2020-Imaging enlarging brain metastasis and receive further radiation treatment; 02/22/2020 - 03/14/2020-Radiation to left cerebellar lesion, retroclival lesion; 05/12/2020 MRI and CT scan of the chest, abdomen and pelvis showed disease stability; 08/10/2020- CT imaging was reported as stable.  Brain MRI was significant for new infratentorial compartment metastasis; 10/02/2020 -  Radiation- Date of treatment is unknown but she received further radiation therapy to  at least 4 lesions; 10/12/2020 - 02/22/2021-Chemotherapy- concurrent Zejula at 200 mg daily with pembrolizumab; 01/10/2021-MRI brain done elsewhere showed new leptomeningeal enhancement of the inferior anterior lateral left frontal lobe, new nodular enhancement of the left anterior cingulate gyrus, increased size of nodular leptomeningeal enhancement of bilateral  trigeminal nerves, right superior cerebellar and anterior callosal enhancing lesions have increased in size and left vent trolled lateral cerebellar leptomeningeal enhancement is similar compared to before CT imaging shows stability of cardiophrenic lymph node 01/17/2021 - 01/31/2021- Radiation to whole brain, 30 Gy in 10 fractions; 02/26/2021-MRI spine showed leptomeningeal disease in the cervical and thoracic cord, bulky at T1 with significant epidural lesion in that location    Currently in Pain? No/denies                             [facility] Adult PT Treatment/Exercise - 04/11/21 0001      Neuro Re-ed    Neuro Re-ed Details  in parallel bars: side steps x 2 lengths, tandem walk x 4 lengths, tandem stance work x 3 max bil, stance on foam with head turns x 2 and EC x 2 without much difficulty, all performed in parallel bars with hand hold as needed and belt but no LOB      Exercises   Exercises Other Exercises    Other Exercises  seated LAQ x 10 bil, ankle pumps x 10 bil      Knee/Hip Exercises: Standing   Heel Raises Both;10 reps    Heel Raises Limitations at raised plinth    Hip Abduction Both;10 reps    Abduction Limitations at raised plinth    Functional Squat 10 reps    Functional Squat Limitations at raised plinth    SLS 3 each x max in parallel bars    Other Standing Knee Exercises hamstring curls x 10 bil at raised bed,    Other Standing Knee Exercises sit to stand x 5 with vcs for nose over toes                       PT Long Term Goals - 03/27/21 1208      PT LONG TERM GOAL #1   Title Pt will be independent in a home exercise program for long term stretching and strengthening.    Time 4    Period Weeks    Status New      PT LONG TERM GOAL #2   Title Pt will be able to sit to stand from a chair 8 time in 30 seconds without use of hands.    Baseline 6 reps with use of UEs    Time 5    Period Weeks    Status New      PT LONG TERM GOAL #3    Title Pt will report improved sense of balance as evidenced by decreased reliance on cane for household mobility.    Time 5    Period Weeks    Status New      PT LONG TERM GOAL #4   Title Pt will be able to maintain bilateral tandem stance for atleast 10 seconds each    Baseline left foot forward 3 sec    Time 5    Period Weeks      PT LONG TERM GOAL #5   Title Pt will be able to maintain SLS for 8-10 seconds without LOB    Time 5  Additional Long Term Goals   Additional Long Term Goals Yes      PT LONG TERM GOAL #6   Title Pt will maintain SLS for 8-10 seconds to demonstrate improved balance    Baseline 2-3 sec    Time 5    Period Weeks    Status New                 Plan - 04/11/21 1105    Clinical Impression Statement Pt is feeling like her normal self today and was able to tolerance a full PT session of TE and balance work.  Pt overall does very well with all activities; no LOB today and even minimal sway with foam and eyes closed.  General fatigue and difficulty with sit to stand descent.  pt will be OOT until early June and will call to set up more if she is feeling well.  Pt has stopped all active treatment at this time.    PT Frequency 2x / week    PT Treatment/Interventions ADLs/Self Care Home Management;Therapeutic exercise;Therapeutic activities;Balance training;Neuromuscular re-education;Gait training;Patient/family education;Manual techniques    PT Next Visit Plan NO RESISTANCE - METS; cont LE exercises / balance, monitor O2 prn    Consulted and Agree with Plan of Care Patient           Patient will benefit from skilled therapeutic intervention in order to improve the following deficits and impairments:     Visit Diagnosis: Difficulty in walking, not elsewhere classified  Repeated falls  Muscle weakness (generalized)  Malignant neoplasm of ovary, unspecified laterality [facility])     Problem List Patient Active Problem List   Diagnosis Date  Noted  . Other fatigue 04/10/2021  . Dysuria 04/02/2021  . 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 Acquired hypothyroidism 03/12/2021  . 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 Other constipation 03/12/2021  . Goals of care, counseling/discussion 03/12/2021  . 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

## 2021-04-11 NOTE — Telephone Encounter (Signed)
-----   Message from Heath Lark, MD sent at 04/11/2021 12:18 PM EDT ----- Regarding: urine culture is neg Pls call and let her know

## 2021-04-11 NOTE — Telephone Encounter (Signed)
VM left for pt to call back

## 2021-04-19 ENCOUNTER — Ambulatory Visit: Payer: Medicare Other | Admitting: Internal Medicine

## 2021-05-03 ENCOUNTER — Other Ambulatory Visit: Payer: Self-pay

## 2021-05-03 ENCOUNTER — Inpatient Hospital Stay: Payer: Medicare Other | Attending: Gynecologic Oncology | Admitting: Internal Medicine

## 2021-05-03 VITALS — BP 134/82 | HR 64 | Temp 95.8°F | Resp 18 | Ht 62.0 in | Wt 150.8 lb

## 2021-05-03 DIAGNOSIS — Z79899 Other long term (current) drug therapy: Secondary | ICD-10-CM | POA: Insufficient documentation

## 2021-05-03 DIAGNOSIS — C7931 Secondary malignant neoplasm of brain: Secondary | ICD-10-CM | POA: Insufficient documentation

## 2021-05-03 DIAGNOSIS — Z7952 Long term (current) use of systemic steroids: Secondary | ICD-10-CM | POA: Insufficient documentation

## 2021-05-03 DIAGNOSIS — Z923 Personal history of irradiation: Secondary | ICD-10-CM | POA: Diagnosis not present

## 2021-05-03 DIAGNOSIS — C569 Malignant neoplasm of unspecified ovary: Secondary | ICD-10-CM | POA: Diagnosis not present

## 2021-05-03 DIAGNOSIS — C7949 Secondary malignant neoplasm of other parts of nervous system: Secondary | ICD-10-CM

## 2021-05-03 NOTE — Progress Notes (Signed)
[place] [facility] [place],  [address] [phone]   Interval Evaluation  Date of Service: 05/03/21 Patient Name: Connie Sharp Patient MRN: [id number] Patient DOB: 1941-10-05 Provider: Ventura Sellers, MD  Identifying Statement:  Connie Sharp is a 80 y.o. 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  She had abdominal paracentesis at the time   04/28/2017 - 06/10/2017 Chemotherapy   She had 3 cycles of neoadjuvant chemo with carboplatin and paclitaxel in [place]       07/22/2017 Surgery   She had exploratory laparotomy and interval cytoreductive surgery   08/26/2017 - 10/07/2017 Chemotherapy   She received 3 more cycles of postoperative chemotherapy with carboplatin and paclitaxel       02/01/2019 PET scan   Outside PET CT scan showed brain metastasis and cardio phrenic lymph node   02/18/2019 Surgery   She underwent neurosurgical resection of brain mass and postoperative radiation therapy.  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   06/02/2019 PET scan   Repeat outside PET CT scan showed cardiophrenic lymph node unchanged.   08/30/2019 Imaging   Outside MRI late September showed tumor in the roof of the left orbit.   09/06/2019 PET scan   October 2020 PET CT scan abnormalities on the left orbital tumor and stable cardiophrenic nodule.   10/04/2019 Surgery   She underwent radiosurgery for orbital tumor.   11/08/2019 Imaging   Outside follow-up MRI showed reduce in size of  the size of left orbital tumor.   02/07/2020 Imaging   She was noted to have enlarging brain metastasis and receive further radiation treatment.   02/22/2020 - 03/14/2020 Radiation Therapy   She received treatment to left cerebellar lesion, retroclival lesion   05/12/2020 Imaging   Outside MRI and CT scan of the chest, abdomen and pelvis showed disease stability.   08/10/2020 Imaging   Outside CT imaging was reported as stable.  Brain MRI was significant for new infratentorial compartment metastasis.   10/02/2020 -  Radiation Therapy   Date of treatment is unknown but she received further radiation therapy to at least 4 lesions.   10/12/2020 - 02/22/2021 Chemotherapy   She has received concurrent Zejula at 200 mg daily with pembrolizumab       01/10/2021 Imaging   MRI brain done elsewhere showed new leptomeningeal enhancement of the inferior anterior lateral left frontal lobe, new nodular enhancement of the left anterior cingulate gyrus, increased size of nodular leptomeningeal enhancement of bilateral trigeminal nerves, right superior cerebellar and anterior callosal enhancing lesions have increased in size and left vent trolled lateral cerebellar leptomeningeal enhancement is similar compared to before  CT imaging shows stability of cardiophrenic lymph node   01/17/2021 - 01/31/2021 Radiation Therapy   She received whole brain radiation therapy, 30 Gy in 10 fractions   01/31/2021 -  Radiation Therapy   She received whole brain radiation therapy, 30 Gy completed on 01/31/2021   02/21/2021 Tumor Marker   Patient's tumor was tested for the following markers: CA-125 Results of the tumor marker test revealed 58   02/26/2021 Imaging  Outside MRI spine showed leptomeningeal disease in the cervical and thoracic cord, bulky at T1 with significant epidural lesion in that location   03/12/2021 Cancer Staging   Staging form: Ovary, Fallopian Tube, and Primary Peritoneal Carcinoma, AJCC 8th Edition -  Clinical stage from 03/12/2021: Stage IV (rcT3, cN0, pM1) - Signed by Heath Lark, MD on 03/12/2021 Stage prefix: Recurrence   03/12/2021 Tumor Marker   Patient's tumor was tested for the following markers: CA-125 Results of the tumor marker test revealed 45.8     Interval History:  Connie Sharp presents today for follow up after recent return to Connie Sharp.  She describes relative stability of symptoms, overall.  She does describe new facial numbness on the left side, mostly at level of cheek and eye.  No pain, no conversion to headache.  No weakness in the face or double vision.  Continues to ambulate independently, takes care of ADLs.  Does complain of fatigue, maybe somewhat worse from prior.      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  She describes ~6 weeks history of numbness in her legs, left more than right.  It comes and goes, moves up as high as her hip, sometimes just in the foot itself.  She also describes clear decline in her balance, she is now often walking with a cane.  At home, going from room to room, she does not need the cane.  Otherwise denies headaches, seizures, double vision, cognitive impairment, dysuria, saddle numbness.  Plan was for spine radiation in [place], but she has moved to [place] to be closer to her family.  Medications: Current Outpatient Medications on File Prior to Visit  Medication Sig Dispense Refill  . atorvastatin (LIPITOR) 40 MG tablet Take 40 mg by mouth daily.    . Cholecalciferol (VITAMIN D3) 50 MCG (2000 UT) TABS Take 1 tablet by mouth daily.    Marland Kitchen dexamethasone (DECADRON) 1 MG tablet Take 1 tablet (1 mg total) by mouth daily. 30 tablet 3  . docusate sodium (COLACE) 100 MG capsule Take 100 mg by mouth 2 (two) times daily.    Marland Kitchen doxylamine, Sleep, (UNISOM) 25 MG tablet Take 25 mg by mouth at bedtime as needed.    . fluticasone (VERAMYST) 27.5 MCG/SPRAY nasal  spray Place 2 sprays into the nose daily.    Marland Kitchen levothyroxine (SYNTHROID) 50 MCG tablet Take 50 mcg by mouth daily before breakfast.    . LORazepam (ATIVAN) 1 MG tablet Take 1 mg by mouth at bedtime.    . Melatonin 10 MG TABS Take by mouth.    . pantoprazole (PROTONIX) 40 MG tablet Take 40 mg by mouth at bedtime.    . polyethylene glycol (MIRALAX / GLYCOLAX) 17 g packet Take 17 g by mouth daily.    . propranolol (INNOPRAN XL) 120 MG 24 hr capsule Take 120 mg by mouth daily.    . Sennosides (SENNA) 8.6 MG CAPS Take 2 capsules by mouth 2 (two) times daily.    . timolol (TIMOPTIC-XR) 0.25 % ophthalmic gel-forming Place 1 drop into both eyes daily.    . mirabegron ER (MYRBETRIQ) 50 MG TB24 tablet Take 50 mg by mouth daily. (Patient not taking: Reported on 05/03/2021)     No current facility-administered medications on file prior to visit.    Allergies:  Allergies  Allergen Reactions  . Ambien [Zolpidem Tartrate] Other (See Comments)    Delirium  . Cephalexin  Nausea And Vomiting, Hives and Rash  . Cefaclor Other (See Comments) and Hives    unknown  . Noroxin [Norfloxacin] Other (See Comments)    unknown  . Scallops [Shellfish Allergy] Nausea And Vomiting and Swelling   Past Medical History:  Past Medical History:  Diagnosis Date  . Anemia   . Atrophic vaginitis 09/29/2012  . Brain cancer ([place])    metastasis  . Degenerative, intervertebral disc, cervical   . Diverticulosis   . 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 GERD (gastroesophageal reflux disease)   . Glaucoma    left eye  . Hx of migraines   . Hyperlipidemia   . Hypertension   . Injury of right rotator cuff   . Kidney stones   . 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 Migraine   . Osteoporosis 10/18/2011  . Ovarian cancer ([place])   . Thyroid disease    hypothyroid   Past Surgical History:  Past Surgical History:  Procedure Laterality Date  . ABDOMINAL HYSTERECTOMY     1987  . BILATERAL  SALPINGOOPHORECTOMY     07-22-2017  . CHOLECYSTECTOMY  08/02/2017  . COLONOSCOPY  08/2015  . EYE SURGERY Left 2004   Left eye wrikleremoval  . NOSE SURGERY  09/05/2014   Fractured Nose   . Plastic reconstructive      Scar tissue left breast at age 89  . TONSILLECTOMY AND ADENOIDECTOMY     Social History:  Social History   Socioeconomic History  . Marital status: Married    Spouse name: Not on file  . Number of children: Not on file  . Years of education: Not on file  . Highest education level: Not on file  Occupational History  . Not on file  Tobacco Use  . Smoking status: Former Smoker    Packs/day: 0.25    Years: 4.00    Pack years: 1.00    Types: Cigarettes    Quit date: 03/07/1965    Years since quitting: 56.1  . Smokeless tobacco: Never Used  Vaping Use  . Vaping Use: Never used  Substance and Sexual Activity  . Alcohol use: Yes    Comment: occasionally  . Drug use: Never  . Sexual activity: Not Currently  Other Topics Concern  . Not on file  Social History Narrative  . Not on file   Social Determinants of Health   Financial Resource Strain: Not on file  Food Insecurity: Not on file  Transportation Needs: Not on file  Physical Activity: Not on file  Stress: Not on file  Social Connections: Not on file  Intimate Partner Violence: Not At Risk  . Fear of Current or Ex-Partner: No  . Emotionally Abused: No  . Physically Abused: No  . Sexually Abused: No   Family History:  Family History  Problem Relation Age of Onset  . Hypertension Mother   . Congestive Heart Failure Mother   . Hypercholesterolemia Mother   . Macular degeneration Father   . Non-Hodgkin's lymphoma Brother   . Colon cancer Neg Hx   . Ovarian cancer Neg Hx   . Breast cancer Neg Hx   . Endometrial cancer Neg Hx   . Pancreatic cancer Neg Hx   . Prostate cancer Neg Hx     Review of Systems: Constitutional: Doesn't report fevers, chills or abnormal weight loss Eyes: Doesn't report  blurriness of vision Ears, nose, mouth, throat, and face: Doesn't report sore throat Respiratory: Doesn't report cough, dyspnea or wheezes Cardiovascular:  Doesn't report palpitation, chest discomfort  Gastrointestinal:  Doesn't report nausea, constipation, diarrhea GU: Doesn't report incontinence Skin: Doesn't report skin rashes Neurological: Per HPI Musculoskeletal: Doesn't report joint pain Behavioral/Psych: Doesn't report anxiety  Physical Exam: Vitals:   05/03/21 1000  BP: 134/82  Pulse: 64  Resp: 18  Temp: (!) 95.8 F (35.4 C)  SpO2: 100%   KPS: 70. General: Alert, cooperative, pleasant, in no acute distress Head: Normal EENT: No conjunctival injection or scleral icterus.  Lungs: Resp effort normal Cardiac: Regular rate Abdomen: Non-distended abdomen Skin: No rashes cyanosis or petechiae. Extremities: No clubbing or edema  Neurologic Exam: Mental Status: Awake, alert, attentive to examiner. Oriented to self and environment. Language is fluent with intact comprehension.  Cranial Nerves: Visual acuity is grossly normal. Visual fields are full. Extra-ocular movements intact. No ptosis. Face is symmetric Motor: Tone and bulk are normal. Power is full in both arms and legs. Reflexes are decreased, no pathologic reflexes present.  Sensory: Impaired in stocking pattern Gait: Wide based gait  Labs: I have reviewed the data as listed    Component Value Date/Time   NA 143 04/10/2021 1047   K 3.8 04/10/2021 1047   CL 105 04/10/2021 1047   CO2 27 04/10/2021 1047   GLUCOSE 117 (H) 04/10/2021 1047   BUN 12 04/10/2021 1047   CREATININE 0.88 04/10/2021 1047   CALCIUM 8.9 04/10/2021 1047   PROT 6.0 (L) 04/10/2021 1047   ALBUMIN 3.4 (L) 04/10/2021 1047   AST 15 04/10/2021 1047   ALT 14 04/10/2021 1047   ALKPHOS 51 04/10/2021 1047   BILITOT 1.5 (H) 04/10/2021 1047   GFRNONAA >60 04/10/2021 1047   Lab Results  Component Value Date   WBC 5.4 04/10/2021   NEUTROABS 4.2  04/10/2021   HGB 9.2 (L) 04/10/2021   HCT 26.4 (L) 04/10/2021   MCV 98.9 04/10/2021   PLT 337 04/10/2021    Assessment/Plan Leptomeningeal metastases ([facility])  Connie Sharp presents today with new focal symtpom localizing to left trigeminal V2 distribution. This is not surprising in light of tumor nodule surrounding the nerve, visualized on brain MRI from February 2022.  This region was treated with whole brain radiation therapy in [place] in March.    Drop metastasis in thoracic spine was also treated remotely, during that month.    Goals of care were discussed, and patient is not ready to abandon traditional medical/oncologic care.  In the absence of palliative-only measures, we recommended repeating CNS axis imaging.  This will include both brain and total spine studies, ideally to be completed within 1-2 weeks.    She is agreeable to this plan.  We also encouraged close follow up for re-evaluation of systemic therapy with Dr. Alvy Bimler.   We appreciate the opportunity to participate in the care of Connie Sharp.  We will review her imaging in tumor board, once complete, and give her a call to discuss our recommendations.  All questions were answered. The patient knows to call the clinic with any problems, questions or concerns. No barriers to learning were detected.  The total time spent in the encounter was 40 minutes and more than 50% was on counseling and review of test results   Ventura Sellers, MD Medical Director of Neuro-Oncology [facility] at [place] 05/03/21 3:41 PM

## 2021-05-04 ENCOUNTER — Telehealth: Payer: Self-pay | Admitting: *Deleted

## 2021-05-04 ENCOUNTER — Telehealth: Payer: Self-pay | Admitting: Hematology and Oncology

## 2021-05-04 NOTE — Telephone Encounter (Signed)
Scheduled appts per 6/3 sch msg. Pt aware.

## 2021-05-04 NOTE — Telephone Encounter (Signed)
Received call from patient seeking to confirm location of her upcoming scans.  Confirmed with her that her scans are here @ [facility].  No further questions or concerns.

## 2021-05-10 ENCOUNTER — Other Ambulatory Visit: Payer: Self-pay | Admitting: Radiation Therapy

## 2021-05-14 ENCOUNTER — Ambulatory Visit: Payer: Medicare Other | Admitting: Internal Medicine

## 2021-05-15 ENCOUNTER — Telehealth: Payer: Self-pay

## 2021-05-15 ENCOUNTER — Ambulatory Visit (HOSPITAL_COMMUNITY)
Admission: RE | Admit: 2021-05-15 | Discharge: 2021-05-15 | Disposition: A | Discharge status: Home / Self Care | Payer: Medicare Other | Source: Ambulatory Visit | Attending: Internal Medicine | Admitting: Internal Medicine

## 2021-05-15 ENCOUNTER — Other Ambulatory Visit: Payer: Self-pay | Admitting: Internal Medicine

## 2021-05-15 ENCOUNTER — Other Ambulatory Visit: Payer: Self-pay

## 2021-05-15 DIAGNOSIS — C7949 Secondary malignant neoplasm of other parts of nervous system: Secondary | ICD-10-CM | POA: Diagnosis not present

## 2021-05-15 IMAGING — MR MR HEAD WO/W CM
14 series · 48 of 48 positions shown · IV contrast (gadavist)
Comparison: Total spine MRI the same day reported separately.

Prior outside brain MRI [DATE].

CLINICAL DATA: 79-year-old female with metastatic ovarian cancer,
with prior outside imaging.
TECHNIQUE: Multiplanar, multiecho pulse sequences of the brain and surrounding
structures were obtained without and with intravenous contrast.

CONTRAST:  7mL GADAVIST GADOBUTROL 1 MMOL/ML IV SOLN

[Series 5: DWI · axial · 3.0mm · 1.36mm/px · z∈[-68,+90]mm · 5 of 108 slices shown (1 of 2)]
[im 1/108]
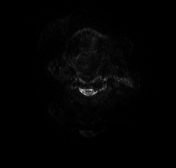
[im 27/108]
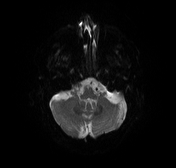
[im 54/108]
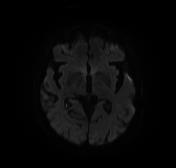
[im 81/108]
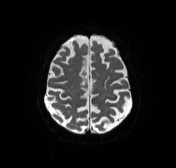
[im 108/108]
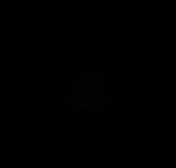

[Series 6: DWI · axial · 3.0mm · 1.36mm/px · z∈[-68,+90]mm · 3 of 54 slices shown (2 of 2)]
[im 1/54]
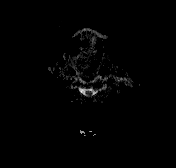
[im 27/54]
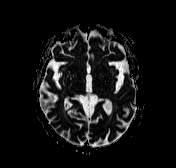
[im 54/54]
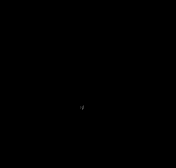

[Series 7: T1 · sagittal · 5.0mm · 0.75mm/px · 1 of 24 slices shown (1 of 3)]
[im 1/24]
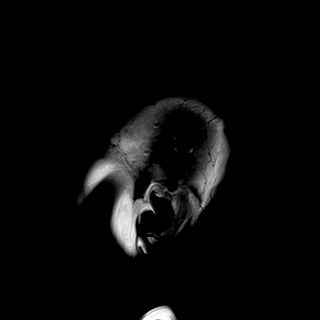

[Series 8: T2 · axial · 5.0mm · 0.62mm/px · 1 of 27 slices shown (1 of 2)]
[im 1/27]
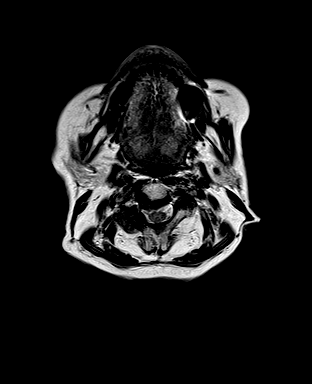

[Series 9: swi_images · axial · 3.0mm · 0.75mm/px · z∈[-77,+86]mm · 3 of 56 slices shown]
[im 1/56]
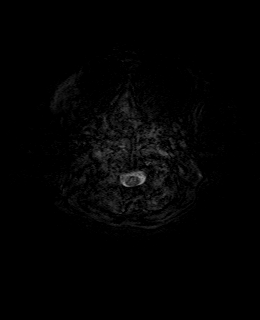
[im 28/56]
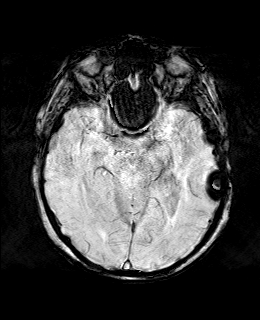
[im 56/56]
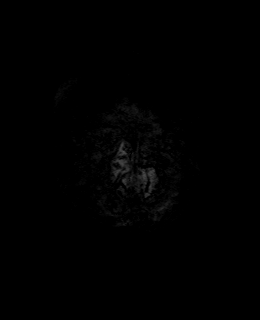

[Series 11: FLAIR · axial · 3.0mm · 0.75mm/px · z∈[-74,+84]mm · 3 of 54 slices shown]
[im 1/54]
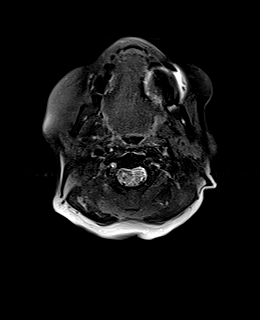
[im 27/54]
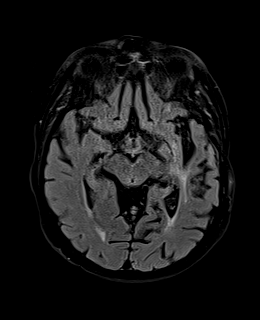
[im 54/54]
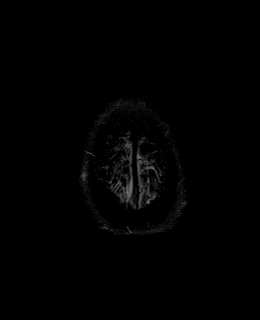

[Series 12: T1 · axial · 1.0mm · 0.94mm/px · z∈[-82,+92]mm · 9 of 176 slices shown (2 of 3)]
[im 1/176]
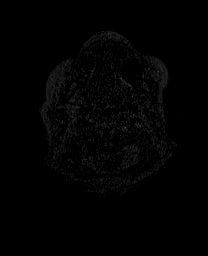
[im 22/176]
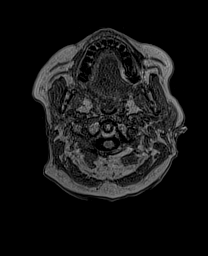
[im 44/176]
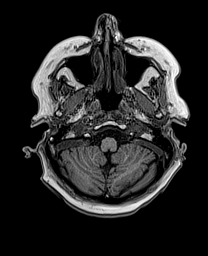
[im 66/176]
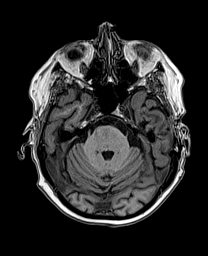
[im 88/176]
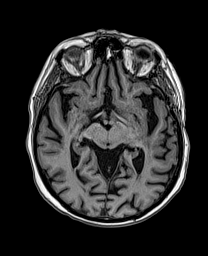
[im 110/176]
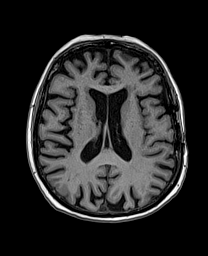
[im 132/176]
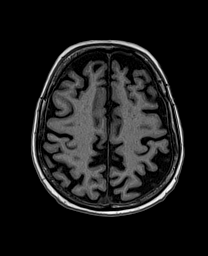
[im 154/176]
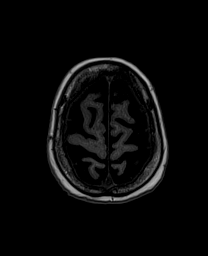
[im 176/176]
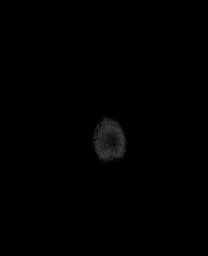

[Series 13: cor dwi_tracew · coronal · 5.0mm · 1.53mm/px · 3 of 52 slices shown]
[im 1/52]
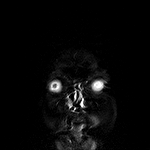
[im 26/52]
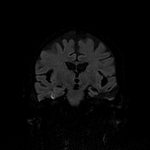
[im 52/52]
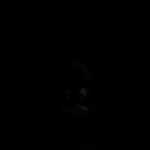

[Series 14: cor dwi_adc · coronal · 5.0mm · 1.53mm/px · 1 of 26 slices shown]
[im 1/26]
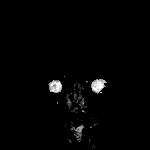

[Series 15: T2 · coronal · 5.0mm · 0.57mm/px · 1 of 26 slices shown (2 of 2)]
[im 1/26]
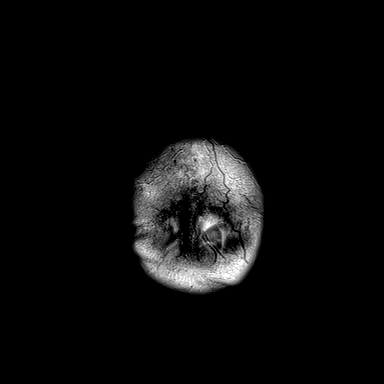

[Series 20: T1 · axial · 1.0mm · 0.94mm/px · z∈[-94,+64]mm · 8 of 160 slices shown (3 of 3)]
[im 1/160]
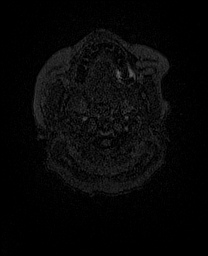
[im 23/160]
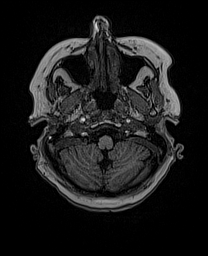
[im 46/160]
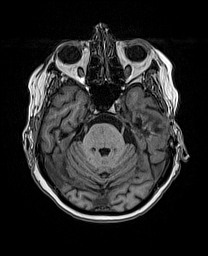
[im 69/160]
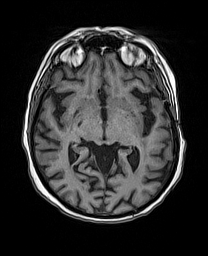
[im 91/160]
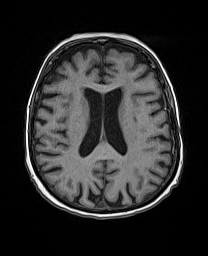
[im 114/160]
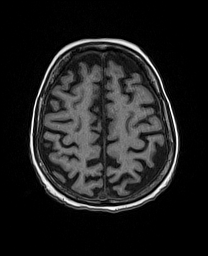
[im 137/160]
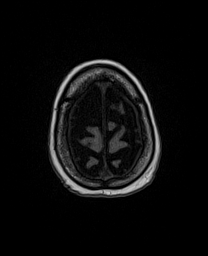
[im 160/160]
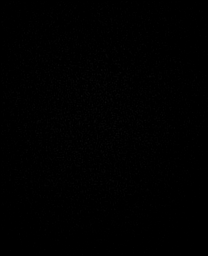

[Series 22: T1 post-contrast · sagittal · 5.0mm · 0.75mm/px · 1 of 24 slices shown (1 of 3)]
[im 1/24]
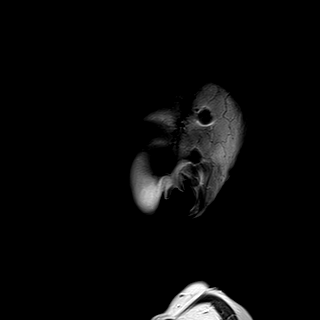

[Series 23: T1 post-contrast · axial · 1.0mm · 0.94mm/px · z∈[-86,+56]mm · 7 of 144 slices shown (2 of 3)]
[im 1/144]
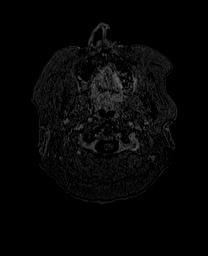
[im 24/144]
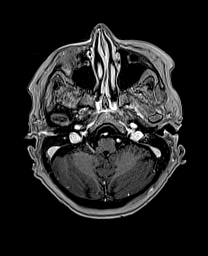
[im 48/144]
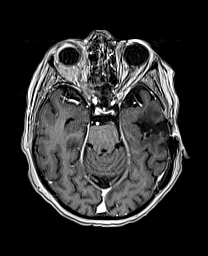
[im 72/144]
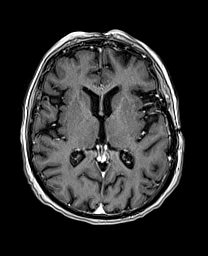
[im 96/144]
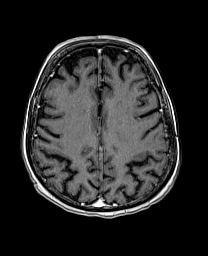
[im 120/144]
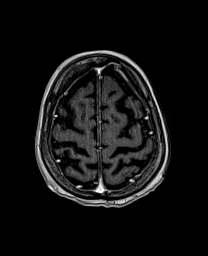
[im 144/144]
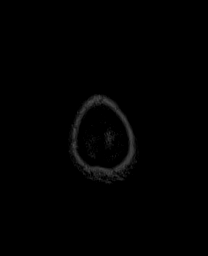

[Series 24: T1 post-contrast · coronal · 5.0mm · 0.43mm/px · 2 of 32 slices shown (3 of 3)]
[im 1/32]
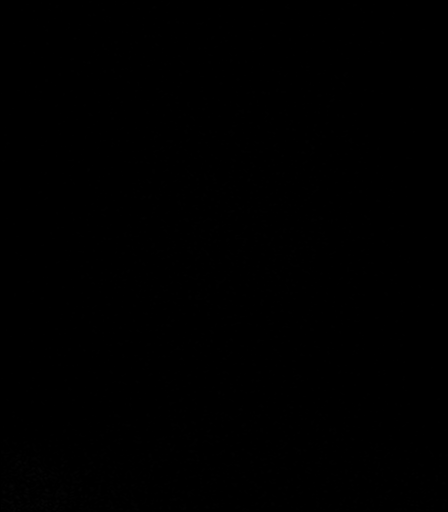
[im 32/32]
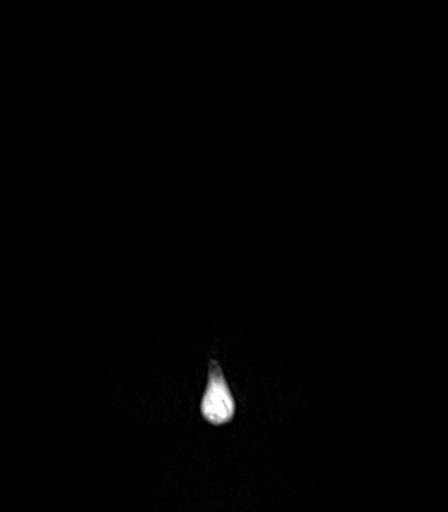

[48 of 48 positions shown; findings below may reference images not displayed]

History of brain metastasis resected in [D9]. Also left orbital
tumor treated with radio surgery. Posterior fossa metastasis in
[D9]. New leptomeningeal enhancement reported in [REDACTED].

Status post whole brain radiation in [REDACTED].

Spinal leptomeningeal disease also diagnosed in [REDACTED], questionably
treated in [place].

Six weeks of left greater than right lower extremity numbness. Left
trigeminal V2 distribution symptoms. Declining balance.

Initially the study was done without obtaining postcontrast images.
The patient returned for postcontrast imaging on the evening of
[DATE].

EXAM:
MRI HEAD WITHOUT AND WITH CONTRAST
FINDINGS: Brain: Left inferior temporal lobe resection cavity with stable
minimal curvilinear probable vascular related enhancement along the
deep margin (series 23, image 50). Mild regional T2 and FLAIR
hyperintensity is stable without mass effect.

Resolved abnormal pericallosal enhancement seen in [REDACTED] (compare
series 10, image 13 at that time to series 22, image 12 today.
Similarly, previously seen rounded foci of abnormal enhancement
along the right superior cerebellum,, at the right cisterna magna,
and along the left cingulate gyrus are resolved.

A subtle focus of left inferior cerebellar leptomeningeal
enhancement persists on series 23, image 12 and series 22, image 17.
And a similar subtle focus of enhancement at the left inferior
frontal gyrus above the posterior left orbit persists (series 24,
image 23 and series 23, image 58). But these appear smaller since
[REDACTED].

Abnormal enlargement and enhancement of the right 5th nerve Meckel's
cave, with abnormal nodular enhancing lesions along both cisternal
5th nerve segments appear largely resolved (series 23 images 35, 38,
41).

No new abnormal intracranial enhancement. No pachymeningeal
thickening. No superimposed restricted diffusion to suggest acute
infarction. No midline shift, mass effect, ventriculomegaly,
extra-axial collection or acute intracranial hemorrhage. Scattered
bilateral patchy cerebral white matter T2 and FLAIR hyperintensity
appears mildly progressed since [REDACTED] and might be sequelae of
XRT. No chronic cerebral blood products identified. Cervicomedullary
junction and pituitary are within normal limits.

Vascular: Major intracranial vascular flow voids are stable.
Dominant left vertebral artery. The major dural venous sinuses are
enhancing and appear to be patent.

Skull and upper cervical spine: Visible cervical spine demonstrates
disc and endplate degeneration. No abnormal cervical cord
enhancement. Visualized bone marrow signal is within normal limits.
Previous left frontotemporal craniotomy.

Sinuses/Orbits: Postoperative changes to the left globe are stable.
Otherwise negative orbits. Paranasal sinuses and mastoids are stable
and well aerated.

Other: Visible internal auditory structures appear normal. Negative
visible scalp and face.
IMPRESSION: 1. Regression of intracranial metastatic disease since [REDACTED].
- bulky and nodular enhancement of the right Meckel's cave and both
cisternal 5th nerve segments has largely resolved.
- there is subtle residual leptomeningeal enhancement at the
inferior left cerebellum, and the inferior left frontal gyrus just
above the left orbit.
- all other leptomeningeal/cerebral metastases seen in [REDACTED]
appear resolved following whole brain radiation.

2. Stable and satisfactory left temporal lobe resection cavity.

3. No new metastatic disease or new intracranial abnormality
identified.

## 2021-05-15 IMAGING — MR MR TOTAL SPINE METS SCREENING
11 of 15 series · 31 of 48 positions shown · IV contrast ([ID] GADAVIST)
Comparison: Outside spine MRI [DATE]

CLINICAL DATA: 79-year-old female with metastatic ovarian cancer,
with prior outside imaging.
TECHNIQUE: Multisequence MR imaging of the spine from the cervical spine to the
sacrum was performed prior to and following IV contrast
administration for evaluation of spinal metastatic disease.

CONTRAST:  7 mL Gadavist

[Series 16: T1 · sagittal · 4.0mm · 1.15mm/px · 2 of 8 slices shown (1 of 5)]
[im 1/8]
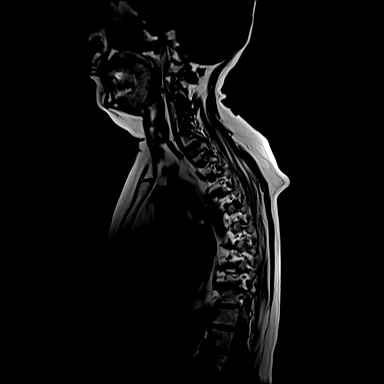
[im 8/8]
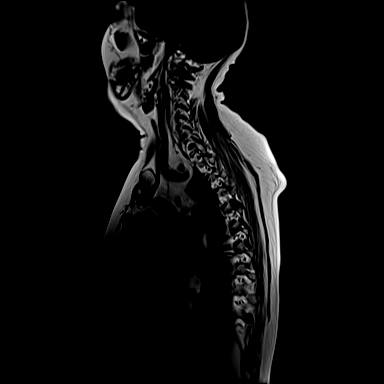

[Series 17: T1 · sagittal · 4.0mm · 1.00mm/px · 3 of 15 slices shown (2 of 5)]
[im 1/15]
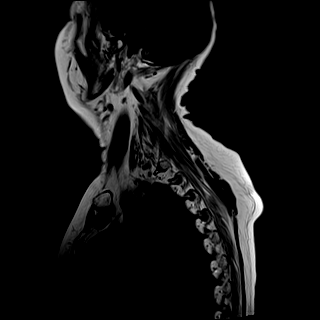
[im 8/15]
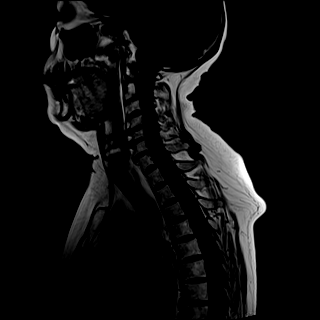
[im 15/15]
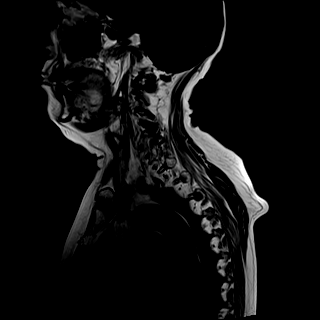

[Series 18: T1 · sagittal · 4.0mm · 1.19mm/px · 3 of 15 slices shown (3 of 5)]
[im 1/15]
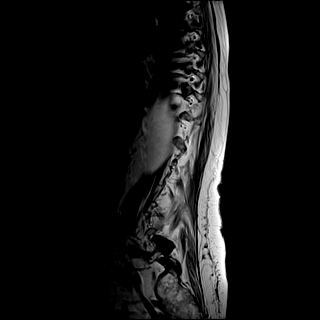
[im 8/15]
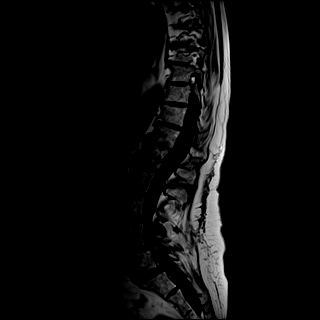
[im 15/15]
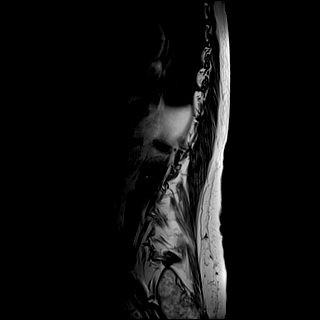

[Series 19: T1 · sagittal · 4.0mm · 1.00mm/px · 2 of 12 slices shown (4 of 5)]
[im 1/12]
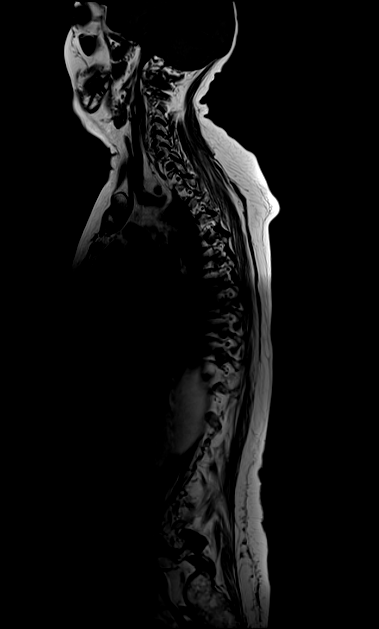
[im 12/12]
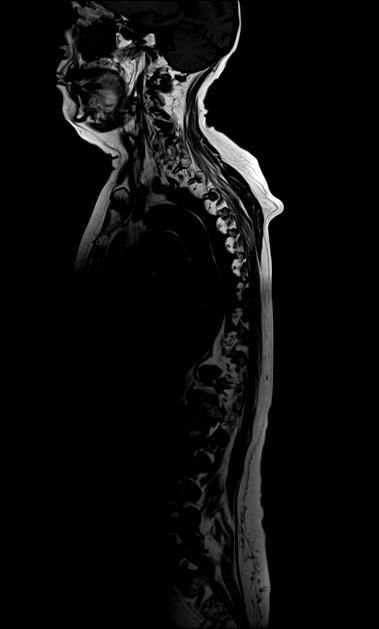

[Series 20: T1 · sagittal · 4.0mm · 1.00mm/px · 2 of 12 slices shown (5 of 5)]
[im 1/12]
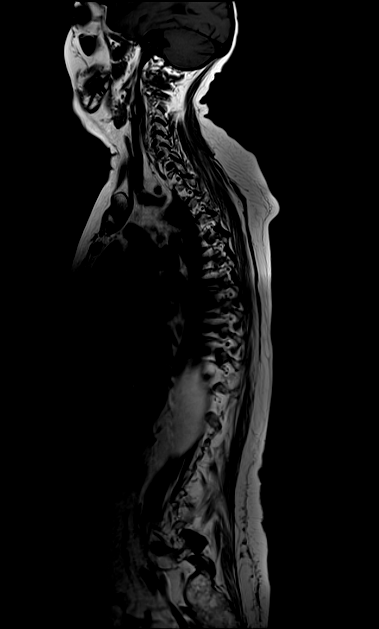
[im 12/12]
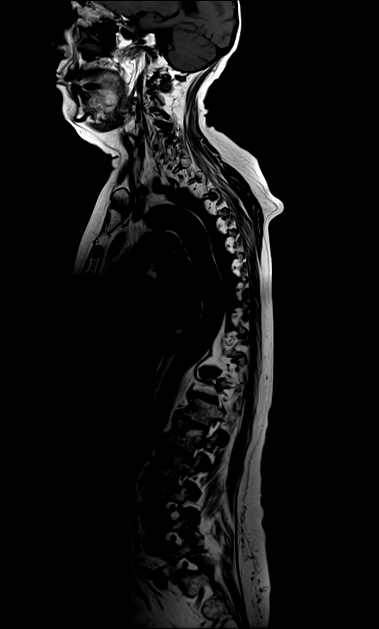

[Series 21: STIR · sagittal · 3.0mm · 1.00mm/px · 4 of 20 slices shown (1 of 2)]
[im 1/20]
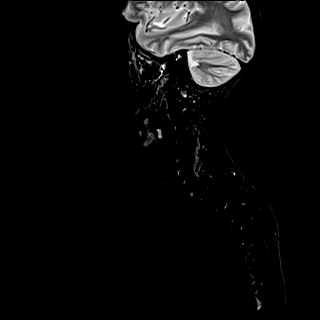
[im 7/20]
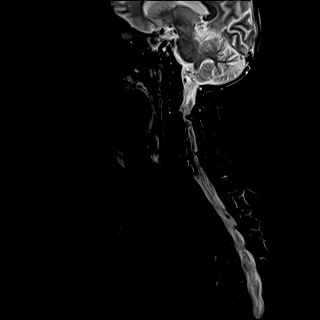
[im 13/20]
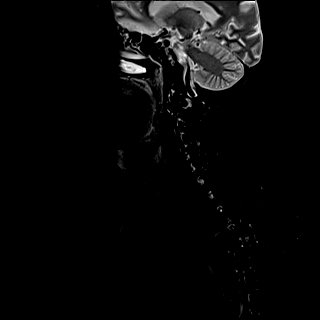
[im 20/20]
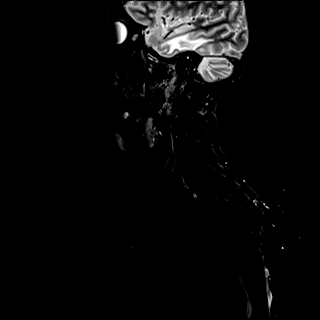

[Series 22: STIR · sagittal · 3.0mm · 1.00mm/px · 3 of 20 slices shown (2 of 2)]
[im 1/20]
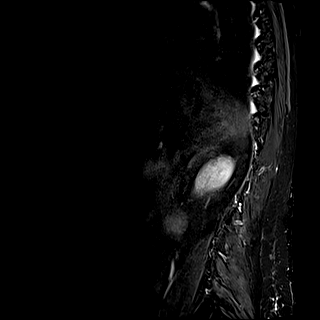
[im 7/20]
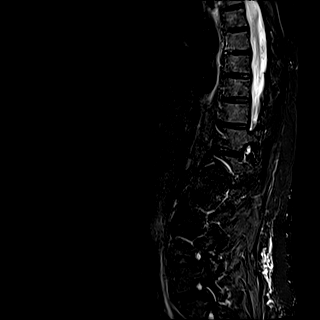
[im 13/20]
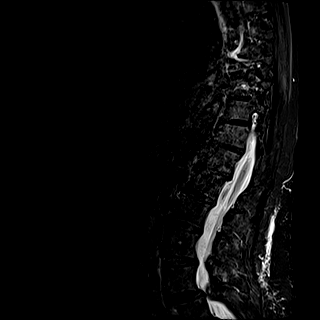

[Series 25: T1 fat-sat post-contrast · sagittal · 4.0mm · 1.00mm/px · 3 of 15 slices shown (1 of 3)]
[im 1/15]
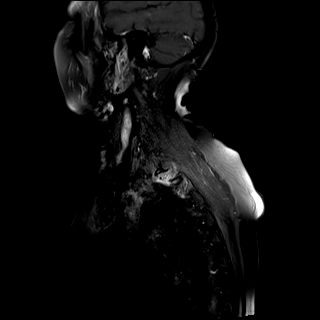
[im 8/15]
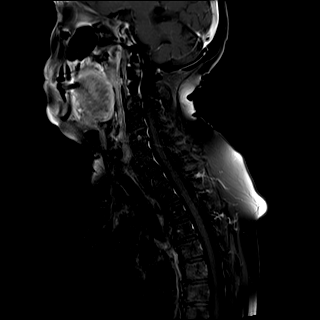
[im 15/15]
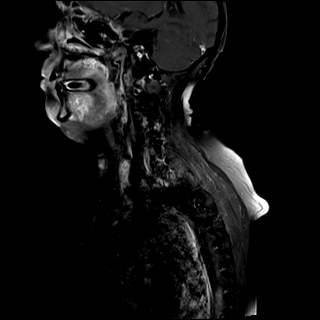

[Series 26: T1 fat-sat post-contrast · sagittal · 4.0mm · 1.19mm/px · 3 of 15 slices shown (2 of 3)]
[im 1/15]
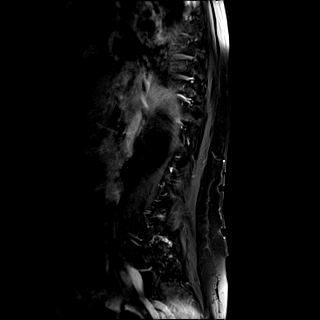
[im 8/15]
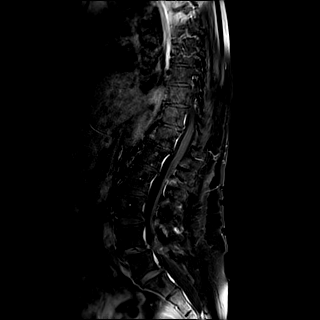
[im 15/15]
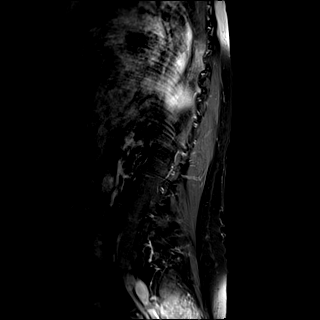

[Series 27: T1 fat-sat · sagittal · 4.0mm · 1.00mm/px · 3 of 13 slices shown]
[im 1/13]
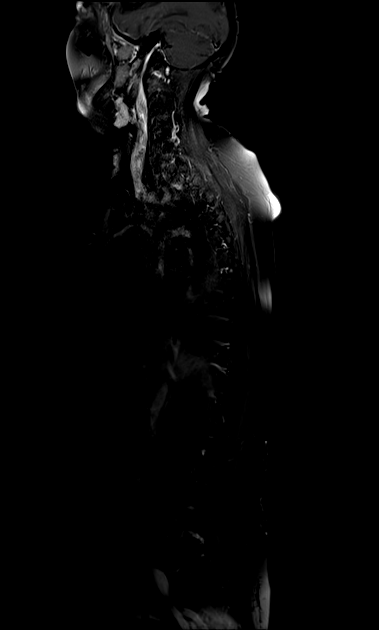
[im 7/13]
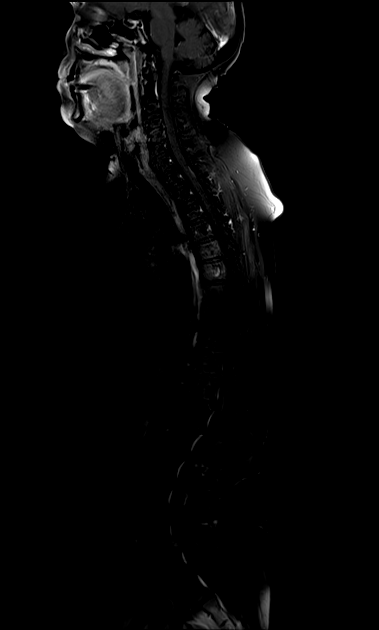
[im 13/13]
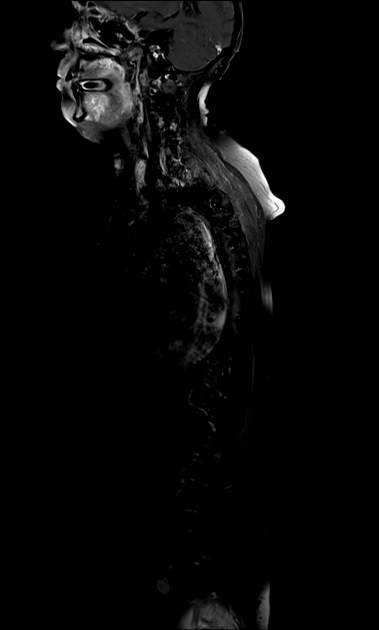

[Series 38: T1 fat-sat post-contrast · sagittal · 4.0mm · 1.19mm/px · 3 of 15 slices shown (3 of 3)]
[im 1/15]
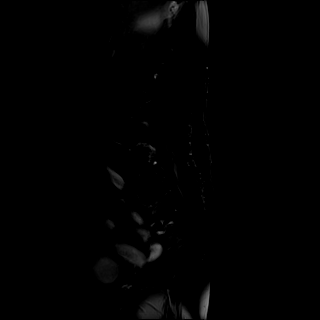
[im 8/15]
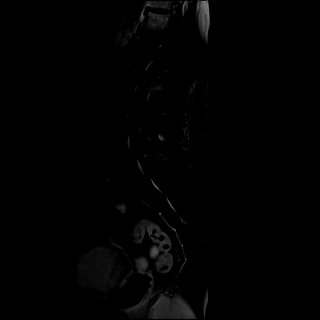
[im 15/15]
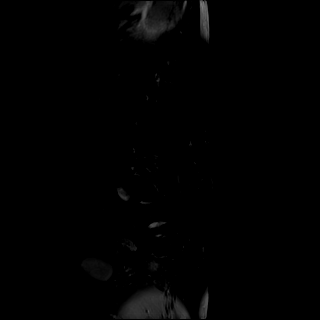

[31 of 48 positions shown; findings below may reference images not displayed]

History of brain metastasis resected in [B4]. Also left orbital
tumor treated with radio surgery. Posterior fossa metastasis in
[B4]. New leptomeningeal enhancement reported in [REDACTED].

Status post whole brain radiation in [REDACTED].

Spinal leptomeningeal disease also diagnosed in [REDACTED], questionably
treated in [place].

Six weeks of left greater than right lower extremity numbness. Left
trigeminal V2 distribution symptoms. Declining balance.

EXAM:
MRI TOTAL SPINE WITHOUT AND WITH CONTRAST
FINDINGS: MRI CERVICAL SPINE FINDINGS

Alignment: Stable straightening of cervical lordosis and mild
degenerative appearing anterolisthesis of C2 on C3.

Vertebrae: Degenerative marrow signal changes in right side C4-C5
facets and scattered cervical vertebral endplates. No convincing
vertebral metastasis.

Cord: No convincing cervical cord signal abnormality, with
suboptimal evaluation of both the cervical and thoracic cord
substance given only sagittal STIR imaging in the design of this
exam.

No definite cervical leptomeningeal enhancement. And resolution of a
small dorsal, nodular focus of abnormal enhancement at the C7 cord
seen on [DATE].

Posterior Fossa, paraspinal tissues: Brain MRI the same day is
reported separately.

Disc levels:

Mild multifactorial degenerative cervical spinal stenosis C2-C3,
C4-C5 and C5-C6 appears stable since [REDACTED].

MRI THORACIC SPINE FINDINGS

Segmentation:  Normal.

Alignment:  Stable thoracic kyphosis.

Vertebrae: T6 benign vertebral body hemangioma appears stable since
[REDACTED]. Normal background bone marrow signal. No suspicious thoracic
vertebral marrow signal or enhancement.

Cord: Substantially regressed bulky and nodular tumor along the
dorsal upper thoracic cord which was up to 9 mm thick (series 26,
image 25 on [DATE]) and 12 mm in length at the T1 level in
[REDACTED]. Residual sessile enhancement now on series 25, image 7 is
still about 12 mm in length. This is also visible on series 40,
image 6, up to 2 mm in thickness. Much smaller but distinct T2
dorsal cord nodule appears resolved.

No new thoracic leptomeningeal thickening or enhancement is
identified.

No definite abnormal thoracic cord signal, with suboptimal
evaluation of both the cord substance given only sagittal STIR
imaging in the design of this exam. Conus medullaris is at T12-L1.

Paraspinal soft tissues: Within normal limits.

Disc levels:

Mild for age thoracic spine degeneration with capacious spinal
canal.

MRI LUMBAR SPINE FINDINGS

Segmentation:  Normal.

Alignment: Stable lumbar lordosis with mild grade 1 anterolisthesis
at L3-L4, L4-L5, and L5-S1.

Vertebrae: Lumbar bone marrow signal is within normal limits. No
suspicious marrow lesion or enhancement identified.

Conus medullaris: Extends to the T12-L1 level and is within normal
limits. No convincing cauda equina thickening or leptomeningeal
enhancement.

Paraspinal and other soft tissues: Within normal limits.

Disc levels:

Lumbar disc and posterior element degeneration in the setting of
mild spondylolisthesis L3-L4 through L5-S1. Subsequent
multifactorial mild spinal stenosis and mild to moderate appearing
degenerative neural foraminal stenosis at L4-L5 appears stable since
[REDACTED].
IMPRESSION: 1. Bulky dorsal leptomeningeal metastasis at the T1 spinal cord has
substantially regressed since [DATE] mm thick x 12 mm
length sessile enhancing lesion there now, decreased from 9 mm x 12
mm in [DATE]. Smaller dorsal T7 and T2 cord metastases appear resolved. And no
other leptomeningeal or spinal metastatic disease is identified.
No definite abnormal signal in the substance of the spinal cord,
although with sensitivity decreased by the design of this total
spine screening exam.

3. Cervical and lumbar spine degenerative disease appears stable
since [DATE].

## 2021-05-15 MED ORDER — LORAZEPAM 1 MG PO TABS: 1.0000 mg | ORAL_TABLET | Freq: Once | ORAL | 0 refills | Status: DC | PRN

## 2021-05-15 MED ORDER — GADOBUTROL 1 MMOL/ML IV SOLN
7.0000 mL | Freq: Once | INTRAVENOUS | Status: AC | PRN
  Administered 2021-05-15: 7 mL via INTRAVENOUS

## 2021-05-15 NOTE — Telephone Encounter (Signed)
Pt contacted to let her know that Dr Mickeal Skinner called in something for her to take prior to her MRI today. Pt verbalized understanding.Pt will have someone drive her to and from her MRI.

## 2021-05-15 NOTE — Telephone Encounter (Signed)
Pt's family member called stating pt has an MRI today at 2:30 pm. Pt states the procedure will take about an hour and would like some type of "sedative" if possible prior to help her relax. Will discuss with Dr Mickeal Skinner.

## 2021-05-16 ENCOUNTER — Inpatient Hospital Stay (HOSPITAL_BASED_OUTPATIENT_CLINIC_OR_DEPARTMENT_OTHER): Payer: Medicare Other | Admitting: Internal Medicine

## 2021-05-16 DIAGNOSIS — C7949 Secondary malignant neoplasm of other parts of nervous system: Secondary | ICD-10-CM | POA: Diagnosis not present

## 2021-05-16 NOTE — Progress Notes (Signed)
I connected with Connie Sharp on 05/16/21 at 11:00 AM EDT by telephone visit and verified that I am speaking with the correct person using two identifiers.  I discussed the limitations, risks, security and privacy concerns of performing an evaluation and management service by telemedicine and the availability of in-person appointments. I also discussed with the patient that there may be a patient responsible charge related to this service. The patient expressed understanding and agreed to proceed.  Other persons participating in the visit and their role in the encounter:  n/a  Patient's location:  Home  Provider's location:  Office  Chief Complaint:  Leptomeningeal metastases ([place])  History of Present Ilness: Connie Sharp describes no new or progressive neurologic deficits.  Continues to describe some numbness affecting left side of face and corner of her eye.  Ambulating independently. Observations: Language and cognition at baseline  Imaging:  [facility] Interpretation: I have personally reviewed the CNS images as listed.  My interpretation, in the context of the patient's clinical presentation, is stable disease  MR TOTAL SPINE METS SCREENING  Result Date: 05/16/2021 CLINICAL DATA:  80 year old female with metastatic ovarian cancer, with prior outside imaging. History of brain metastasis resected in 2020. Also left orbital tumor treated with radio surgery. Posterior fossa metastasis in 2021. New leptomeningeal enhancement reported in February. Status post whole brain radiation in March. Spinal leptomeningeal disease also diagnosed in March, questionably treated in [place]. Six weeks of left greater than right lower extremity numbness. Left trigeminal V2 distribution symptoms. Declining balance. EXAM: MRI TOTAL SPINE WITHOUT AND WITH CONTRAST TECHNIQUE: Multisequence MR imaging of the spine from the cervical spine to the sacrum was performed prior to and following IV contrast  administration for evaluation of spinal metastatic disease. CONTRAST:  7 mL Gadavist COMPARISON:  Outside spine MRI 02/20/2021 FINDINGS: MRI CERVICAL SPINE FINDINGS Alignment: Stable straightening of cervical lordosis and mild degenerative appearing anterolisthesis of C2 on C3. Vertebrae: Degenerative marrow signal changes in right side C4-C5 facets and scattered cervical vertebral endplates. No convincing vertebral metastasis. Cord: No convincing cervical cord signal abnormality, with suboptimal evaluation of both the cervical and thoracic cord substance given only sagittal STIR imaging in the design of this exam. No definite cervical leptomeningeal enhancement. And resolution of a small dorsal, nodular focus of abnormal enhancement at the C7 cord seen on 02/20/2021. Posterior Fossa, paraspinal tissues: Brain MRI the same day is reported separately. Disc levels: Mild multifactorial degenerative cervical spinal stenosis C2-C3, C4-C5 and C5-C6 appears stable since March. MRI THORACIC SPINE FINDINGS Segmentation:  Normal. Alignment:  Stable thoracic kyphosis. Vertebrae: T6 benign vertebral body hemangioma appears stable since March. Normal background bone marrow signal. No suspicious thoracic vertebral marrow signal or enhancement. Cord: Substantially regressed bulky and nodular tumor along the dorsal upper thoracic cord which was up to 9 mm thick (series 26, image 25 on 02/20/2021) and 12 mm in length at the T1 level in March. Residual sessile enhancement now on series 25, image 7 is still about 12 mm in length. This is also visible on series 40, image 6, up to 2 mm in thickness. Much smaller but distinct T2 dorsal cord nodule appears resolved. No new thoracic leptomeningeal thickening or enhancement is identified. No definite abnormal thoracic cord signal, with suboptimal evaluation of both the cord substance given only sagittal STIR imaging in the design of this exam. Conus medullaris is at T12-L1. Paraspinal soft  tissues: Within normal limits. Disc levels: Mild for age thoracic spine degeneration with  capacious spinal canal. MRI LUMBAR SPINE FINDINGS Segmentation:  Normal. Alignment: Stable lumbar lordosis with mild grade 1 anterolisthesis at L3-L4, L4-L5, and L5-S1. Vertebrae: Lumbar bone marrow signal is within normal limits. No suspicious marrow lesion or enhancement identified. Conus medullaris: Extends to the T12-L1 level and is within normal limits. No convincing cauda equina thickening or leptomeningeal enhancement. Paraspinal and other soft tissues: Within normal limits. Disc levels: Lumbar disc and posterior element degeneration in the setting of mild spondylolisthesis L3-L4 through L5-S1. Subsequent multifactorial mild spinal stenosis and mild to moderate appearing degenerative neural foraminal stenosis at L4-L5 appears stable since March. IMPRESSION: 1. Bulky dorsal leptomeningeal metastasis at the T1 spinal cord has substantially regressed since March. Residual 2 mm thick x 12 mm length sessile enhancing lesion there now, decreased from 9 mm x 12 mm in March. 2. Smaller dorsal T7 and T2 cord metastases appear resolved. And no other leptomeningeal or spinal metastatic disease is identified. No definite abnormal signal in the substance of the spinal cord, although with sensitivity decreased by the design of this total spine screening exam. 3. Cervical and lumbar spine degenerative disease appears stable since March. Electronically Signed   By: Genevie Ann M.D.   On: 05/16/2021 09:39    Assessment and Plan: Leptomeningeal metastases [facility])  Reviewed results and interpretation spine study.  Demonstrates marked improvement to therapy, no new or progressive changes.  Brain MRI appears stable, but we are lacking contrast enhancement for full evaluation.  Will discuss in brain/spine tumor board early next week.  Patient will visit with Dr. Alvy Bimler next week for further goals of care, systemic therapy  discussion  Follow Up Instructions: F/u in 3 months with repeat brain imaging, or sooner if new symptoms develop.  I discussed the assessment and treatment plan with the patient.  The patient was provided an opportunity to ask questions and all were answered.  The patient agreed with the plan and demonstrated understanding of the instructions.    The patient was advised to call back or seek an in-person evaluation if the symptoms worsen or if the condition fails to improve as anticipated.  I provided 5-10 minutes of non-face-to-face time during this enocunter.  Ventura Sellers, MD   I provided 15 minutes of non face-to-face telephone visit time during this encounter, and > 50% was spent counseling as documented under my assessment & plan.

## 2021-05-22 ENCOUNTER — Encounter: Payer: Self-pay | Admitting: Hematology and Oncology

## 2021-05-22 ENCOUNTER — Inpatient Hospital Stay: Payer: Medicare Other

## 2021-05-22 ENCOUNTER — Other Ambulatory Visit: Payer: Self-pay

## 2021-05-22 ENCOUNTER — Inpatient Hospital Stay (HOSPITAL_BASED_OUTPATIENT_CLINIC_OR_DEPARTMENT_OTHER): Payer: Medicare Other | Admitting: Hematology and Oncology

## 2021-05-22 DIAGNOSIS — C569 Malignant neoplasm of unspecified ovary: Secondary | ICD-10-CM

## 2021-05-22 DIAGNOSIS — Z7189 Other specified counseling: Secondary | ICD-10-CM

## 2021-05-22 LAB — CBC WITH DIFFERENTIAL/PLATELET
Abs Immature Granulocytes: 0.05 10*3/uL (ref 0.00–0.07)
Basophils Absolute: 0 10*3/uL (ref 0.0–0.1)
Basophils Relative: 0 %
Eosinophils Absolute: 0 10*3/uL (ref 0.0–0.5)
Eosinophils Relative: 0 %
HCT: 37.2 % (ref 36.0–46.0)
Hemoglobin: 12.3 g/dL (ref 12.0–15.0)
Immature Granulocytes: 1 %
Lymphocytes Relative: 6 %
Lymphs Abs: 0.5 10*3/uL — ABNORMAL LOW (ref 0.7–4.0)
MCH: 34.1 pg — ABNORMAL HIGH (ref 26.0–34.0)
MCHC: 33.1 g/dL (ref 30.0–36.0)
MCV: 103 fL — ABNORMAL HIGH (ref 80.0–100.0)
Monocytes Absolute: 0.5 10*3/uL (ref 0.1–1.0)
Monocytes Relative: 6 %
Neutro Abs: 7.2 10*3/uL (ref 1.7–7.7)
Neutrophils Relative %: 87 %
Platelets: 201 10*3/uL (ref 150–400)
RBC: 3.61 MIL/uL — ABNORMAL LOW (ref 3.87–5.11)
RDW: 12.7 % (ref 11.5–15.5)
WBC: 8.2 10*3/uL (ref 4.0–10.5)
nRBC: 0 % (ref 0.0–0.2)

## 2021-05-22 LAB — CMP (CANCER CENTER ONLY)
ALT: 14 U/L (ref 0–44)
AST: 15 U/L (ref 15–41)
Albumin: 3.8 g/dL (ref 3.5–5.0)
Alkaline Phosphatase: 39 U/L (ref 38–126)
Anion gap: 8 (ref 5–15)
BUN: 17 mg/dL (ref 8–23)
CO2: 25 mmol/L (ref 22–32)
Calcium: 8.7 mg/dL — ABNORMAL LOW (ref 8.9–10.3)
Chloride: 107 mmol/L (ref 98–111)
Creatinine: 0.87 mg/dL (ref 0.44–1.00)
GFR, Estimated: >60 mL/min (ref >60)
Glucose, Bld: 107 mg/dL — ABNORMAL HIGH (ref 70–99)
Potassium: 3.5 mmol/L (ref 3.5–5.1)
Sodium: 140 mmol/L (ref 135–145)
Total Bilirubin: 1.4 mg/dL — ABNORMAL HIGH (ref 0.3–1.2)
Total Protein: 6.2 g/dL — ABNORMAL LOW (ref 6.5–8.1)

## 2021-05-22 MED ORDER — LORAZEPAM 1 MG PO TABS: 1.0000 mg | ORAL_TABLET | Freq: Every day | ORAL | 0 refills | Status: DC

## 2021-05-22 MED ORDER — DEXAMETHASONE 1 MG PO TABS: 1.0000 mg | ORAL_TABLET | Freq: Every day | ORAL | 3 refills | Status: DC

## 2021-05-22 NOTE — Assessment & Plan Note (Signed)
She has appointment and follow-up with Dr. Mickeal Skinner in neuro oncology to review test results Overall, she has no new neurological symptoms We discussed long-term follow-up and plan of care The patient and family has made informed decision not to pursue further palliative systemic chemotherapy I recommend referral for home-based palliative care Her son knows of a palliative care care physician and will make the referral himself We discussed the risk and benefits of long-term dexamethasone I refill her prescription today but I encouraged her to discuss this with Dr. Mickeal Skinner first before she resume taking dexamethasone

## 2021-05-22 NOTE — Assessment & Plan Note (Signed)
She is enjoying great quality of life since discontinuation of niraparib Her pancytopenia has resolved I recommend discontinuation of Lipitor We discussed the risk and benefits of dexamethasone We discussed plan of care and she is in agreement to proceed with palliative care We discussed CODE STATUS The patient is not sure if she wants DNR I gave her DO NOT RESUSCITATE order to take home; if she agrees for DNR, she can display the DNR order in a prominent location in her house I do not plan to see her back but is available to see her back if needed in the future

## 2021-05-22 NOTE — Progress Notes (Signed)
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 Mickeal Skinner in neuro oncology to review test results Overall, she has no new neurological symptoms We discussed long-term follow-up and plan of care The patient and family has made informed decision not to pursue further palliative systemic chemotherapy I recommend referral for home-based palliative care Her son knows of a palliative care care physician and will make the referral himself We discussed the risk and benefits of long-term dexamethasone I refill her prescription today but I encouraged her to discuss this with Dr. Mickeal Skinner first before she resume taking dexamethasone  Goals of care, counseling/discussion She is enjoying great quality of life since discontinuation of niraparib Her pancytopenia has resolved I recommend discontinuation of Lipitor We discussed the risk and benefits of dexamethasone We discussed plan of care and she is in agreement to proceed with palliative care We discussed CODE STATUS The patient is not sure if she wants DNR I gave her DO NOT RESUSCITATE order to take home; if she agrees for DNR, she can display the DNR order in a prominent location in her house I do not plan to see her back but is available to see her back if needed in the future  No orders of the defined types were placed in this encounter.   All questions were answered. The patient knows to call the clinic with any problems, questions or concerns. The total time spent in the appointment was 30 minutes encounter with patients including review of chart and various tests results, discussions about plan of care and coordination of care plan   Connie Lark, MD 05/22/2021 2:05 PM  INTERVAL HISTORY: Please see below for problem oriented charting. 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  She had abdominal paracentesis at the time   04/28/2017 - 06/10/2017 Chemotherapy   She had 3 cycles of neoadjuvant chemo with carboplatin and paclitaxel in [place]       07/22/2017 Surgery   She had exploratory laparotomy and interval cytoreductive surgery   08/26/2017 - 10/07/2017 Chemotherapy   She received 3 more cycles of postoperative chemotherapy with carboplatin and paclitaxel       02/01/2019 PET scan   Outside PET CT scan showed brain metastasis and cardio phrenic lymph node   02/18/2019 Surgery   She underwent neurosurgical resection of brain mass and postoperative radiation therapy.  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   06/02/2019 PET scan   Repeat outside PET CT scan showed cardiophrenic lymph node unchanged.   08/30/2019 Imaging   Outside MRI late September showed tumor in the roof of the left orbit.   09/06/2019 PET scan  October 2020 PET CT scan abnormalities on the left orbital tumor and stable cardiophrenic nodule.   10/04/2019 Surgery   She underwent radiosurgery for orbital tumor.   11/08/2019 Imaging   Outside follow-up MRI showed reduce in size of the size of left orbital tumor.   02/07/2020  Imaging   She was noted to have enlarging brain metastasis and receive further radiation treatment.   02/22/2020 - 03/14/2020 Radiation Therapy   She received treatment to left cerebellar lesion, retroclival lesion   05/12/2020 Imaging   Outside MRI and CT scan of the chest, abdomen and pelvis showed disease stability.   08/10/2020 Imaging   Outside CT imaging was reported as stable.  Brain MRI was significant for new infratentorial compartment metastasis.   10/02/2020 -  Radiation Therapy   Date of treatment is unknown but she received further radiation therapy to at least 4 lesions.   10/12/2020 - 02/22/2021 Chemotherapy   She has received concurrent Zejula at 200 mg daily with pembrolizumab       01/10/2021 Imaging   MRI brain done elsewhere showed new leptomeningeal enhancement of the inferior anterior lateral left frontal lobe, new nodular enhancement of the left anterior cingulate gyrus, increased size of nodular leptomeningeal enhancement of bilateral trigeminal nerves, right superior cerebellar and anterior callosal enhancing lesions have increased in size and left vent trolled lateral cerebellar leptomeningeal enhancement is similar compared to before  CT imaging shows stability of cardiophrenic lymph node   01/17/2021 - 01/31/2021 Radiation Therapy   She received whole brain radiation therapy, 30 Gy in 10 fractions   01/31/2021 -  Radiation Therapy   She received whole brain radiation therapy, 30 Gy completed on 01/31/2021   02/21/2021 Tumor Marker   Patient's tumor was tested for the following markers: CA-125 Results of the tumor marker test revealed 58   02/26/2021 Imaging   Outside MRI spine showed leptomeningeal disease in the cervical and thoracic cord, bulky at T1 with significant epidural lesion in that location   03/12/2021 Cancer Staging   Staging form: Ovary, Fallopian Tube, and Primary Peritoneal Carcinoma, AJCC 8th Edition - Clinical stage from 03/12/2021: Stage IV (rcT3,  cN0, pM1) - Signed by Connie Lark, MD on 03/12/2021  Stage prefix: Recurrence    03/12/2021 Tumor Marker   Patient's tumor was tested for the following markers: CA-125 Results of the tumor marker test revealed 45.8     REVIEW OF SYSTEMS:   Constitutional: Denies fevers, chills or abnormal weight loss Eyes: Denies blurriness of vision Ears, nose, mouth, throat, and face: Denies mucositis or sore throat Respiratory: Denies cough, dyspnea or wheezes Cardiovascular: Denies palpitation, chest discomfort or lower extremity swelling Gastrointestinal:  Denies nausea, heartburn or change in bowel habits Skin: Denies abnormal skin rashes Lymphatics: Denies new lymphadenopathy or easy bruising Neurological:Denies numbness, tingling or new weaknesses Behavioral/Psych: Mood is stable, no new changes  All other systems were reviewed with the patient and are negative.  I have reviewed the past medical history, past surgical history, social history and family history with the patient and they are unchanged from previous note.  ALLERGIES:  is allergic to [employer] tartrate], cephalexin, cefaclor, noroxin [norfloxacin], and scallops [shellfish allergy].  MEDICATIONS:  Current Outpatient Medications  Medication Sig Dispense Refill   Cholecalciferol (VITAMIN D3) 50 MCG (2000 UT) TABS Take 1 tablet by mouth daily.     dexamethasone (DECADRON) 1 MG tablet Take 1 tablet (1 mg total) by mouth daily. 30 tablet 3   docusate sodium (  COLACE) 100 MG capsule Take 100 mg by mouth 2 (two) times daily.     doxylamine, Sleep, (UNISOM) 25 MG tablet Take 25 mg by mouth at bedtime as needed.     fluticasone (VERAMYST) 27.5 MCG/SPRAY nasal spray Place 2 sprays into the nose daily.     levothyroxine (SYNTHROID) 50 MCG tablet Take 50 mcg by mouth daily before breakfast.     LORazepam (ATIVAN) 1 MG tablet Take 1 tablet (1 mg total) by mouth at bedtime. 60 tablet 0   Melatonin 10 MG TABS Take by mouth.      pantoprazole (PROTONIX) 40 MG tablet Take 40 mg by mouth at bedtime.     polyethylene glycol (MIRALAX / GLYCOLAX) 17 g packet Take 17 g by mouth daily.     propranolol (INNOPRAN XL) 120 MG 24 hr capsule Take 120 mg by mouth daily.     Sennosides (SENNA) 8.6 MG CAPS Take 2 capsules by mouth 2 (two) times daily.     timolol (TIMOPTIC-XR) 0.25 % ophthalmic gel-forming Place 1 drop into both eyes daily.     No current facility-administered medications for this visit.    PHYSICAL EXAMINATION: ECOG PERFORMANCE STATUS: 1 - Symptomatic but completely ambulatory  Vitals:   05/22/21 0954  BP: 117/60  Pulse: 63  Resp: 17  Temp: (!) 97.4 F (36.3 C)  SpO2: 100%   Filed Weights   05/22/21 0954  Weight: 149 lb 3.2 oz (67.7 kg)    GENERAL:alert, no distress and comfortable SKIN: skin color, texture, turgor are normal, no rashes or significant lesions EYES: normal, Conjunctiva are pink and non-injected, sclera clear OROPHARYNX:no exudate, no erythema and lips, buccal mucosa, and tongue normal  NECK: supple, thyroid normal size, non-tender, without nodularity LYMPH:  no palpable lymphadenopathy in the cervical, axillary or inguinal LUNGS: clear to auscultation and percussion with normal breathing effort HEART: regular rate & rhythm and no murmurs and no lower extremity edema ABDOMEN:abdomen soft, non-tender and normal bowel sounds Musculoskeletal:no cyanosis of digits and no clubbing  NEURO: alert & oriented x 3 with fluent speech, no focal motor/sensory deficits  LABORATORY DATA:  I have reviewed the data as listed    Component Value Date/Time   NA 140 05/22/2021 0935   K 3.5 05/22/2021 0935   CL 107 05/22/2021 0935   CO2 25 05/22/2021 0935   GLUCOSE 107 (H) 05/22/2021 0935   BUN 17 05/22/2021 0935   CREATININE 0.87 05/22/2021 0935   CALCIUM 8.7 (L) 05/22/2021 0935   PROT 6.2 (L) 05/22/2021 0935   ALBUMIN 3.8 05/22/2021 0935   AST 15 05/22/2021 0935   ALT 14 05/22/2021 0935    ALKPHOS 39 05/22/2021 0935   BILITOT 1.4 (H) 05/22/2021 0935   GFRNONAA >60 05/22/2021 0935    No results found for: SPEP, UPEP  Lab Results  Component Value Date   WBC 8.2 05/22/2021   NEUTROABS 7.2 05/22/2021   HGB 12.3 05/22/2021   HCT 37.2 05/22/2021   MCV 103.0 (H) 05/22/2021   PLT 201 05/22/2021      Chemistry      Component Value Date/Time   NA 140 05/22/2021 0935   K 3.5 05/22/2021 0935   CL 107 05/22/2021 0935   CO2 25 05/22/2021 0935   BUN 17 05/22/2021 0935   CREATININE 0.87 05/22/2021 0935      Component Value Date/Time   CALCIUM 8.7 (L) 05/22/2021 0935   ALKPHOS 39 05/22/2021 0935   AST 15 05/22/2021 0935  ALT 14 05/22/2021 0935   BILITOT 1.4 (H) 05/22/2021 0935

## 2021-05-23 ENCOUNTER — Other Ambulatory Visit: Payer: Self-pay | Admitting: *Deleted

## 2021-05-23 DIAGNOSIS — C7949 Secondary malignant neoplasm of other parts of nervous system: Secondary | ICD-10-CM

## 2021-05-23 NOTE — Progress Notes (Signed)
Patient arrived to office today thinking she had an appointment today.  Advised her that she had phone visit last week with Dr Mickeal Skinner.  He recommended 3 month follow up.  Son and spouse were present with her in the lobby.  They stated they saw Dr Alvy Bimler yesterday and considered Palliative Care and wanted a referral to be placed in the system for that.   Order added and [facility] called.

## 2021-05-24 ENCOUNTER — Telehealth: Payer: Self-pay

## 2021-05-24 NOTE — Telephone Encounter (Signed)
Attempted to contact patient's husband Jenny Reichmann to schedule a Palliative Care consult appointment. No answer left a message to return call.

## 2021-05-24 NOTE — Telephone Encounter (Signed)
Patient's husband Jenny Reichmann returned my call.Attempted to contact him back. Left another message to return my call.

## 2021-05-25 ENCOUNTER — Telehealth: Payer: Self-pay

## 2021-05-25 NOTE — Telephone Encounter (Signed)
Spoke with patient's husband Jenny Reichmann and scheduled an in-person Palliative Consult for 06/01/21 @ 3PM  COVID screening was negative. No pets in home. Patient lives with husband.  Consent obtained; updated Outlook/Netsmart/Team List and Epic.   Family is aware they may be receiving a call from NP the day before or day of to confirm appointment.

## 2021-06-01 ENCOUNTER — Other Ambulatory Visit: Payer: Medicare Other | Admitting: Nurse Practitioner

## 2021-06-01 ENCOUNTER — Other Ambulatory Visit: Payer: Self-pay

## 2021-06-01 VITALS — BP 106/60 | HR 51 | Resp 18

## 2021-06-01 DIAGNOSIS — R42 Dizziness and giddiness: Secondary | ICD-10-CM

## 2021-06-01 DIAGNOSIS — Z515 Encounter for palliative care: Secondary | ICD-10-CM

## 2021-06-01 NOTE — Progress Notes (Addendum)
[facility] Consult Note Telephone: [phone]  Fax: [phone]    Date of encounter: 06/01/21 PATIENT NAME: Connie Sharp [address] [place] [address]   [phone] (home)  DOB: 1941-06-12 MRN: [id number]  PRIMARY CARE PROVIDER:    Pcp, No,  No address on file None  REFERRING PROVIDER:   Ventura Sellers, MD [address] [place],  [place] [address] [phone]  RESPONSIBLE PARTY:    Contact Information     Name Relation Home Work [place] Spouse [phone]  [phone]   [facility] Daughter [phone]  [phone]   Kortnie, Stovall [phone]  [phone]     I met face to face with patient in home. Patient's husband and their son [name] present during visit.  Palliative Care was asked to follow this patient by consultation request of  Vaslow, Acey Lav, MD to address advance care planning and complex medical decision making. This is the initial visit.                                   ASSESSMENT AND PLAN / RECOMMENDATIONS:   Advance Care Planning/Goals of Care: Goals include to maximize quality of life and symptom management. Our advance care planning conversation included a discussion about:    The value and importance of advance care planning  Experiences with loved ones who have been seriously ill or have died  Exploration of personal, cultural or spiritual beliefs that might influence medical decisions  Exploration of goals of care in the event of a sudden injury or illness  Identification and preparation of a healthcare agent  Review and updating or creation of an  advance directive document . Decision not to resuscitate or to de-escalate disease focused treatments due to poor prognosis. CODE STATUS: DNR Goals of care: Patient's goal of care is comfort. She verbalized desire to enjoy her family and be able to travel to places closes to her hear such as [place]  where she used to live.   Directives: Patient verbalized desire to not be resuscitated in the event of cardiac or respiratory arrest. She has a signed DNR form in home.  The need to complete a MOST form was discussed, patient and her family expressed interest in completing a MOST form today. Discussed and reviewed sections of the form in detail, opportunity for questions given, all questions answered. Form completed and signed with patient, signed form given to patient to keep, copy of MOST and DNR form uploaded to [facility] EMR. Details of MOST form include comfort measures, determine use or limitation of antibiotics, IV fluids for a defined trial period, feeding tube for a defined trial period. Palliative care will continue to provide support to patient, family and medical team.  I spent 48 minutes providing this consultation. More than 50% of the time in this consultation was spent in counseling and care coordination. -----------------------------------------------------------------------------------  Symptom Management/Plan: Dizziness: may be related to low blood pressures. BP today 106/60, HR 51, denied dizziness today. Encouraged supportive care. Encouraged adequate oral fluid intake. Advised to change position slowly. May consider decreasing dose of Propanolol if worsening symptoms. Provided general support and encouragement. Questions and concerns were addressed. Patient and family was encouraged to call with questions and/or concerns.  Follow up Palliative Care Visit: Palliative care will continue to follow for complex medical decision making, advance care planning, and clarification of goals.  Return in about 2 weeks or prn.  PPS: 50%  HOSPICE ELIGIBILITY/DIAGNOSIS: TBD  Chief Complaint: Diziness  History obtained from review of EMR, discussion with Ms. Salminen and family  HISTORY OF PRESENT ILLNESS:  Connie Sharp is a 80 y.o. 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 Has history of brain metastasis resected in 2020. Also has left orbital tumor treated with radiotherapy. Also has posterior fossa metastasis in 2021. Patient currently off treatment due to intolerance and adverse effects from the experimental drug, had problems with anemia and poor oral intake. Patient however report improvement in symptoms since stopping treatment 3 weeks ago, report improved appetite and energy.  Patient report occasional dizziness in the context of low blood pressures. Dizziness aggravated by sudden change in position, relieved by rest. Dizziness not associated with any symptoms. Report occasional numbness affecting left side of face and upper corner of left eye. Patient lives at home with husband of 54 yrs. Her family is planning a trip to the beach at the end of July. Patient hoped and looked forward to the trip with her children and grandchildren. She ambulates with a walking cane, denied any falls in the last month. She denied fever, denied chills.   I reviewed available labs, medications, imaging, studies and related documents from the EMR.  Records reviewed and summarized above.   ROS General: NAD EYES: denies acute vision changes ENMT: denies dysphagia Cardiovascular: denies chest pain, denies DOE Pulmonary: denies cough, denies increased SOB Abdomen: endorses fair appetite, denies constipation, endorses continence of bowel GU: denies dysuria, endorses continence of urine MSK:  endorsed weakness, no recent falls reported Skin: denies rashes or wounds Neurological: denies pain, denies insomnia Psych: Endorses positive mood Heme/lymph/immuno: denies bruises, abnormal bleeding  Vitals:   06/01/21 1615  BP: 106/60  Pulse: (!) 51  Resp: 18  SpO2: 98%    Physical Exam: Current and past weights: 148lbs down from 160lbs baseline weight, Ht 5 3", 26.2kg/m2 Constitutional: NAD General: frail appearing, sitting  in chair in NAD and actively participating in visit discussion EYES: anicteric sclera, no discharge  ENMT: intact hearing, oral mucous membranes moist CV: S1S2, RRR, no LE edema Pulmonary: LCTA, no increased work of breathing, no cough, room air Abdomen: soft and non tender, no ascites GU: deferred MSK: no sarcopenia, moves all extremities, ambulatory Skin: pale, warm and dry, no rashes or wounds on visible skin Neuro: generalized weakness, no cognitive impairment Psych: non-anxious affect, A and O x 4 Hem/lymph/immuno: no widespread bruising  CURRENT PROBLEM LIST:  Patient Active Problem List   Diagnosis Date Noted   Other fatigue 04/10/2021   Dysuria 04/02/2021   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   Leptomeningeal metastases ([facility]) 03/20/2021   Acquired hypothyroidism 03/12/2021   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   Other constipation 03/12/2021   Goals of care, counseling/discussion 03/12/2021   Malignant neoplasm of ovary ([facility]) 08/18/2019   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   PAST MEDICAL HISTORY:  Active Ambulatory Problems    Diagnosis Date Noted   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   Malignant neoplasm of ovary ([facility]) 08/18/2019   Acquired hypothyroidism 03/12/2021   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   Other constipation 03/12/2021   Goals of care, counseling/discussion 03/12/2021   Leptomeningeal metastases ([facility]) 03/20/2021   Dysuria 04/02/2021   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   Other fatigue 04/10/2021   Resolved Ambulatory Problems    Diagnosis Date Noted   No Resolved Ambulatory Problems  Past Medical History:  Diagnosis Date   Anemia    Atrophic vaginitis 09/29/2012   Brain cancer ([place])    Degenerative, intervertebral disc, cervical    Diverticulosis    Fallopian tube cancer, carcinoma ([place]) 11/02/2018   GERD (gastroesophageal reflux disease)    Glaucoma    Hx of migraines    Hyperlipidemia    Hypertension    Injury of  right rotator cuff    Kidney stones    Migraine    Osteoporosis 10/18/2011   Ovarian cancer ([place])    Thyroid disease    SOCIAL HX:  Social History   Tobacco Use   Smoking status: Former    Packs/day: 0.25    Years: 4.00    Pack years: 1.00    Types: Cigarettes    Quit date: 03/07/1965    Years since quitting: 56.2   Smokeless tobacco: Never  Substance Use Topics   Alcohol use: Yes    Comment: occasionally   FAMILY HX:  Family History  Problem Relation Age of Onset   Hypertension Mother    Congestive Heart Failure Mother    Hypercholesterolemia Mother    Macular degeneration Father    Non-Hodgkin's lymphoma Brother    Colon cancer Neg Hx    Ovarian cancer Neg Hx    Breast cancer Neg Hx    Endometrial cancer Neg Hx    Pancreatic cancer Neg Hx    Prostate cancer Neg Hx      ALLERGIES:  Allergies  Allergen Reactions   Ambien [Zolpidem Tartrate] Other (See Comments)    Delirium   Cephalexin Nausea And Vomiting, Hives and Rash   Cefaclor Other (See Comments) and Hives    unknown   Noroxin [Norfloxacin] Other (See Comments)    unknown   Scallops [Shellfish Allergy] Nausea And Vomiting and Swelling     PERTINENT MEDICATIONS:  Outpatient Encounter Medications as of 06/01/2021  Medication Sig   Cholecalciferol (VITAMIN D3) 50 MCG (2000 UT) TABS Take 1 tablet by mouth daily.   dexamethasone (DECADRON) 1 MG tablet Take 1 tablet (1 mg total) by mouth daily.   docusate sodium (COLACE) 100 MG capsule Take 100 mg by mouth 2 (two) times daily.   doxylamine, Sleep, (UNISOM) 25 MG tablet Take 25 mg by mouth at bedtime as needed.   fluticasone (VERAMYST) 27.5 MCG/SPRAY nasal spray Place 2 sprays into the nose daily.   levothyroxine (SYNTHROID) 50 MCG tablet Take 50 mcg by mouth daily before breakfast.   LORazepam (ATIVAN) 1 MG tablet Take 1 tablet (1 mg total) by mouth at bedtime.   Melatonin 10 MG TABS Take by mouth.   pantoprazole (PROTONIX) 40 MG tablet Take 40 mg by mouth at  bedtime.   polyethylene glycol (MIRALAX / GLYCOLAX) 17 g packet Take 17 g by mouth daily.   propranolol (INNOPRAN XL) 120 MG 24 hr capsule Take 120 mg by mouth daily.   Sennosides (SENNA) 8.6 MG CAPS Take 2 capsules by mouth 2 (two) times daily.   timolol (TIMOPTIC-XR) 0.25 % ophthalmic gel-forming Place 1 drop into both eyes daily.   No facility-administered encounter medications on file as of 06/01/2021.   Thank you for the opportunity to participate in the care of Ms. Pankratz.  The palliative care team will continue to follow. Please call our office at [phone] if we can be of additional assistance.   Jari Favre, DNP, AGPCNP-BC  COVID-19 PATIENT SCREENING TOOL Asked and negative response unless otherwise noted:   Have  you had symptoms of covid, tested positive or been in contact with someone with symptoms/positive test in the past 5-10 days?

## 2021-06-02 NOTE — Addendum Note (Signed)
Addended by: Jari Favre C on: 06/02/2021 11:15 PM   Modules accepted: Level of Service

## 2021-06-14 ENCOUNTER — Other Ambulatory Visit: Payer: Self-pay

## 2021-06-14 ENCOUNTER — Other Ambulatory Visit: Payer: Medicare Other | Admitting: Nurse Practitioner

## 2021-06-14 VITALS — BP 130/70 | HR 80 | Resp 18

## 2021-06-14 DIAGNOSIS — R42 Dizziness and giddiness: Secondary | ICD-10-CM

## 2021-06-14 DIAGNOSIS — F5101 Primary insomnia: Secondary | ICD-10-CM

## 2021-06-14 DIAGNOSIS — Z515 Encounter for palliative care: Secondary | ICD-10-CM

## 2021-06-14 MED ORDER — TRAZODONE HCL 50 MG PO TABS: 25.0000 mg | ORAL_TABLET | Freq: Every day | ORAL | 0 refills | Status: AC

## 2021-06-14 NOTE — Progress Notes (Signed)
[facility] Consult Note Telephone: [phone]  Fax: [phone]   Date of encounter: 06/14/21 PATIENT NAME: Connie Sharp [address] [place] [address]   [phone] (home)  DOB: January 14, 1941 MRN: [id number]  PRIMARY CARE PROVIDER:    Pcp, No,  No address on file None  REFERRING PROVIDER:   Ventura Sellers, MD [place],  [place] [address] [phone]  RESPONSIBLE PARTY:    Contact Information     Name Relation Home Work [place] Spouse [phone]  [phone]   [facility] Daughter [phone]  [phone]   Aasia, Peavler [phone]  [phone]     I met face to face with patient in home, her husband as present at visit. Palliative Care was asked to follow this patient by consultation request of  No ref. provider found to address advance care planning and complex medical decision making. This is a follow up visit.                                   ASSESSMENT AND PLAN / RECOMMENDATIONS:   Advance Care Planning/Goals of Care:  CODE STATUS: DNR Goal of care: Patient's goal of care is comfort. Directives: Signed DNR and MOST form present in home, copy on [facility] EMR. Details of the MOST form include comfort measures, determine use or limitation of antibiotics, IV fluids for a defined trial period, feeding tube for a defined trial period.  Symptom Management/Plan: Insomnia: Patient instructed to stop Unisom sleep aid (Diphenhydramine 123m). Start Trazodone 268mby mouth daily at bedtime. Patient instructed not to increase dose without calling [facility] provider or her Oncologist, she does not have a primary care provider at this time. Continue Melatonin at 1081mnd Lorazepam 1mg52m needed. Patient verbalized understanding. Discussed the problem of recurrent insomnia. Avoidance of caffeine sources was strongly encouraged. Sleep hygiene issues are reviewed.    Dizziness: Condition initially thought to be related to low blood pressures. BP has been within normal range, condition may be related / aggravated by high dose of Diphenhydramine. Patient strongly advised to stop med. Discussed safety precautions to include consistent use of her cane during ambulation and changing position gradually. Prevent falls. Questions and concerns were addressed. Patient and her husband was encouraged to call with questions and/or concerns. Provided general support and encouragement, no other unmet needs identified at this time.  Meds ordered this encounter  Medications   traZODone (DESYREL) 50 MG tablet    Sig: Take 0.5-1 tablets (25-50 mg total) by mouth at bedtime.    Dispense:  30 tablet    Refill:  0     CBC    Component Value Date/Time   WBC 8.2 05/22/2021 0935   RBC 3.61 (L) 05/22/2021 0935   HGB 12.3 05/22/2021 0935   HCT 37.2 05/22/2021 0935   PLT 201 05/22/2021 0935   MCV 103.0 (H) 05/22/2021 0935   MCH 34.1 (H) 05/22/2021 0935   MCHC 33.1 05/22/2021 0935   RDW 12.7 05/22/2021 0935   LYMPHSABS 0.5 (L) 05/22/2021 0935   MONOABS 0.5 05/22/2021 0935   EOSABS 0.0 05/22/2021 0935   BASOSABS 0.0 05/22/2021 0935    CMP     Component Value Date/Time   NA 140 05/22/2021 0935   K 3.5 05/22/2021 0935   CL 107 05/22/2021 0935   CO2 25 05/22/2021 0935   GLUCOSE 107 (H)  05/22/2021 0935   BUN 17 05/22/2021 0935   CREATININE 0.87 05/22/2021 0935   CALCIUM 8.7 (L) 05/22/2021 0935   PROT 6.2 (L) 05/22/2021 0935   ALBUMIN 3.8 05/22/2021 0935   AST 15 05/22/2021 0935   ALT 14 05/22/2021 0935   ALKPHOS 39 05/22/2021 0935   BILITOT 1.4 (H) 05/22/2021 0935   GFRNONAA >60 05/22/2021 0935    Follow up Palliative Care Visit: Palliative care will continue to follow for complex medical decision making, advance care planning, and clarification of goals. Return in about 4-6 weeks or prn.  PPS: 60%  HOSPICE ELIGIBILITY/DIAGNOSIS: TBD  CHIEF COMPLAIN:  Insomnia  History obtained from review of epic EMR and discussion with Connie Sharp and her husband.  HISTORY OF PRESENT ILLNESS:  Connie Sharp is a 80 y.o. 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 Also has left orbital tumor treated with radiotherapy and a posterior fossa metastasis in 2021. Patient currently off treatment due to intolerance and adverse effects from the experimental drug used, problems with anemia and poor oral intake. Patient however report improvement in symptoms since stopping treatment, report improved appetite and energy.  Patient with complaint of insomnia, endorsed difficulty staying asleep. Report waking up 2-3 times during the early hours of the morning with difficulty going back to sleep. She currently takes Melatonin 28m, Unisom sleep aid (Diphenhydramine) 1059m and Lorazepam 67m64mt bedtime for sleep. She denied being depressed, denied any uncontrolled pain. She endorsed dizziness which she report as ongoing for more than 2 months and worse when she wakes up in the mornings or with position change. She denied nausea or vomiting, denied headache, denied recent falls. Ten systems reviewed and are negative for acute change, except as noted in the HPI.  Patient report plan to go on a family trip to the beach in two weeks, she expressed excitement saying she looked forward to the trip.  I reviewed available labs, medications, imaging, studies and related documents from the EMR.  Records reviewed and summarized above.   Vitals:   06/14/21 1436  BP: 130/70  Pulse: 80  Resp: 18  SpO2: 97%    Physical Exam: General: frail appearing, sitting in chair in NAD and actively participating in visit discussion ENMT: intact hearing, oral mucous membranes moist CV: S1S2, RRR, no LE edema Pulmonary: LCTA, no increased work of breathing, no cough, room air MSK: mild sarcopenia,  moves all extremities, ambulatory Skin:  warm and dry, no rashes or wounds on visible skin Neuro: generalized weakness, no cognitive impairment Psych: non-anxious affect, A and O x 4 Hem/lymph/immuno: no widespread bruising   Past Medical History:  Diagnosis Date   Anemia    Atrophic vaginitis 09/29/2012   Brain cancer (HCC)    metastasis   Degenerative, intervertebral disc, cervical    Diverticulosis    Fall[place] tube cancer, carcinoma ([facility]   GERD (gastroesophageal reflux disease)    Glaucoma    left eye   Hx of migraines    Hyperlipidemia    Hypertension    Injury of right rotator cuff    Kidney stones    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  The palliative care team will continue to follow. Please  call our office at [phone] if we can be of additional assistance.   Jari Favre, DNP, AGPCNP-BC  COVID-19 PATIENT SCREENING TOOL Asked and negative response unless otherwise noted:   Have you had symptoms of covid, tested positive or been in contact with someone with symptoms/positive test in the past 5-10 days?

## 2021-07-10 ENCOUNTER — Other Ambulatory Visit: Payer: Medicare Other | Admitting: *Deleted

## 2021-07-10 ENCOUNTER — Other Ambulatory Visit: Payer: Self-pay

## 2021-07-10 ENCOUNTER — Other Ambulatory Visit: Payer: Medicare Other

## 2021-07-10 VITALS — BP 98/62 | HR 60 | Temp 97.9°F | Resp 17

## 2021-07-10 DIAGNOSIS — Z515 Encounter for palliative care: Secondary | ICD-10-CM

## 2021-07-10 NOTE — Progress Notes (Signed)
COMMUNITY PALLIATIVE CARE SW NOTE  PATIENT NAME: Connie Sharp DOB: 29-Jan-1941 MRN: [id number]  PRIMARY CARE PROVIDER: Pcp, No  RESPONSIBLE PARTY:  Acct ID - Guarantor Home Phone Work Phone Relationship Acct Type  [id number] LAYLONIE, [phone]  Self P/F     [address], [place], [place] [address]     PLAN OF CARE and INTERVENTIONS:             GOALS OF CARE/ ADVANCE CARE PLANNING:  Goal is for patient to be able to visit her primary home for what she believes is one last time. Patient also desires to spend time with her husband and family. Patient is a DNR.  SOCIAL/EMOTIONAL/SPIRITUAL ASSESSMENT/ INTERVENTIONS:  SW and RN-M. Nadara Mustard completed a follow-up visit with patient at her home. She was present with her husband-Connie Sharp. Patient and her husband sat the kitchen table and provided a status update on herself. Patient report that she continues to have balance issues. She had fall outside yesterday after leaning over to take a picture and fell over. She also had a fall 2 weeks ago, where she bruised the left eye, arm and  knee. Patient ambulates with a cane and is doing well with this overall. Patient remains independent for all ADL's and also occasionally cooks. She reports improved appetite. However she does have dry mouth and intermittently has no taste. The RN suggested that patient suck on candy or use Biotene gel to treat dry mouth. Patient has intermittent pain to the left side of her face. She also difficulty finding words when talking, however the team did not not this during today's visit. Patient takes sleeping pills that are effective, however she occasionally has periods where she gets up several times a night, but does not have difficulty getting back to sleep. She has no issues taking her medications. Patient and her husband will be traveling to [place] tomorrow to visit at the primary residence for a few weeks. SW provided supportive presence, active listening,  assessment of patient's needs and comfort and coping of PCG, while providing reassurance of support. Patient and her husband remain open to ongoing palliative care support/visits.  PATIENT/CAREGIVER EDUCATION/ COPING:  Patient was in a good mood, affect was bright. She appears to be coping well. She and her husband have strong family support. PERSONAL EMERGENCY PLAN:  911 can be activated for emergencies. COMMUNITY RESOURCES COORDINATION/ HEALTH CARE NAVIGATION:  None FINANCIAL/LEGAL CONCERNS/INTERVENTIONS:  No     SOCIAL HX:  Social History   Tobacco Use   Smoking status: Former    Packs/day: 0.25    Years: 4.00    Pack years: 1.00    Types: Cigarettes    Quit date: 03/07/1965    Years since quitting: 56.3   Smokeless tobacco: Never  Substance Use Topics   Alcohol use: Yes    Comment: occasionally    CODE STATUS: DNR ADVANCED DIRECTIVES: Yes MOST FORM COMPLETE: Yes HOSPICE EDUCATION PROVIDED: No  PPS: Patient is alert and oriented x 4. She is independent of ADL's. She ambulates with a walker.   Duration of visit and documentation: 60 minutes.  [address] [place], [place]

## 2021-07-10 NOTE — Progress Notes (Signed)
[facility] COMMUNITY PALLIATIVE CARE RN NOTE  PATIENT NAME: Connie Sharp DOB: 1941-06-16 MRN: [id number]  PRIMARY CARE PROVIDER: Pcp, No  RESPONSIBLE PARTY:  Acct ID - Guarantor Home Phone Work Phone Relationship Acct Type  [id number] Connie Sharp, [phone]  Self P/F     [address], [place], [place] [address]   Covid-19 Pre-screening Negative  PLAN OF CARE and INTERVENTION:  ADVANCE CARE PLANNING/GOALS OF CARE: Goal is for patient to see her home in [place] one more time. PATIENT/CAREGIVER EDUCATION: Symptom management, safe mobility/transfers, fall prevention DISEASE STATUS: Joint palliative care follow-up visit completed with LCSW, M. Lonon. Met with patient and her husband in their home. Patient is alert and oriented x 4 and able to engage in appropriate conversation. She reports having times where she can't think of words which frustrates her. She denies pain or discomfort at this time. She does experience some periocular pain around her left eye. There are times when she says she can't touch the skin around her eye due to pain. Husband states this is the side patient where patient had 2 brain surgeries to have tumors removed, which he feels is the residual cause of this. She does have some dizziness at times. She denies nausea. She is ambulatory using her cane. She is independent with ADLs and still prepares/cooks some meals. She is no longer able to drive due to possibility of seizures. She says there are days where she has less balance. She admits to having a fall 2 weeks ago in her living room where she tripped over the leg of the coffee table and slid across the floor. She sustained a small bruise around the outer aspect of her left eye and some abrasions on her knees which she is treating with Neosporin and covering with a bandaid. She had a fall yesterday outside trying to lean down to take a picture of a flower and fell forward. No apparent injuries. She tries to  remain as active as possible. She just came from the beach with her family, and tomorrow she is travelling to [place] to see her home one more time. Her appetite is good now. She said that prior to coming to [place] a few years ago, she was placed on an experimental drug for her ovarian cancer w/brain mets which caused her to feel bad and lose about 15-18 lbs. When she moved here this medication was discontinued and she has gained most of her weight back. Her only complaint is that her food doesn't have much taste and mouth is often dry. Recommended that she try lozenges and/or also Biotene gel, spray or mouthwash to help. She is currently taking a sleep aid which has helped. She does wake up during the night, but is able to fall back asleep. She is looking forward to her trip tomorrow.   HISTORY OF PRESENT ILLNESS: This is a 80 yo female with a diagnosis of ovarian cancer with brain metastasis. Palliative care team continues to follow patient for additional support, goals of care and complex decision making.  CODE STATUS: DNR ADVANCED DIRECTIVES: Y MOST FORM: yes PPS: 50%   PHYSICAL EXAM:   VITALS: Today's Vitals   07/10/21 1127  BP: 98/62  Pulse: 60  Resp: 17  Temp: 97.9 F (36.6 C)  TempSrc: Temporal  SpO2: 98%  PainSc: 0-No pain    LUNGS: clear to auscultation  CARDIAC: Cor RRR EXTREMITIES: No edema SKIN:  Bruising to outer aspect of left eye, abrasions to knees and left upper  arm   NEURO:  Alert and oriented x 4, pleasant and engaging, ambulatory w/cane   (Duration of visit and documentation 75 minutes)   Daryl Eastern, RN BSN

## 2021-07-16 ENCOUNTER — Telehealth: Payer: Self-pay

## 2021-07-16 NOTE — Telephone Encounter (Signed)
Returned phone call to patient re: refill of Trazodone. Patient has been taking  Trazodone '25mg'$  QHS  but has ran out of her medication. She is currently in [place] and is requesting a refill of medication [facility] there. Will relay information and request to Dr. Mariea Clonts.

## 2021-07-25 ENCOUNTER — Other Ambulatory Visit: Payer: Self-pay

## 2021-07-25 NOTE — Telephone Encounter (Signed)
Dr. Alvy Bimler,  Connie Sharp is currently staying in [place], [place] right now and will return to [place] in 2 weeks. She has 1 lorazepam pill  left that she can take tonight but needs a refill by tomorrow.  [facility] [place] said that [facility] cannot refill it since her MD is out of state.  She needs the [facility] in [place] at the corner of [address]  Their phone number is [phone] if you can call it in.  Let me know if you want me to call it in.  Thanks! [name]

## 2021-07-25 NOTE — Telephone Encounter (Signed)
There is minimum internet connection here so I cannot send it electronically Please call it in Remind her in the future to call us ahead of refill, not wait until last pill

## 2021-07-26 ENCOUNTER — Other Ambulatory Visit: Payer: Self-pay | Admitting: Hematology and Oncology

## 2021-07-26 MED ORDER — LORAZEPAM 1 MG PO TABS: 1.0000 mg | ORAL_TABLET | Freq: Every day | ORAL | 0 refills | Status: DC

## 2021-07-27 ENCOUNTER — Telehealth: Payer: Self-pay | Admitting: Internal Medicine

## 2021-07-27 NOTE — Telephone Encounter (Signed)
Called patient regarding upcoming September appointments, patient is notified.  

## 2021-08-10 ENCOUNTER — Ambulatory Visit (HOSPITAL_COMMUNITY): Payer: Medicare Other

## 2021-08-13 ENCOUNTER — Other Ambulatory Visit: Payer: Self-pay | Admitting: Radiation Therapy

## 2021-08-14 ENCOUNTER — Ambulatory Visit (HOSPITAL_COMMUNITY)
Admission: RE | Admit: 2021-08-14 | Discharge: 2021-08-14 | Disposition: A | Discharge status: Home / Self Care | Payer: Medicare Other | Source: Ambulatory Visit | Attending: Internal Medicine | Admitting: Internal Medicine

## 2021-08-14 ENCOUNTER — Other Ambulatory Visit: Payer: Self-pay

## 2021-08-14 DIAGNOSIS — C7949 Secondary malignant neoplasm of other parts of nervous system: Secondary | ICD-10-CM | POA: Insufficient documentation

## 2021-08-14 IMAGING — MR MR HEAD WO/W CM
15 series · 48 of 48 positions shown · IV contrast (gadavist)
Comparison: None.

CLINICAL DATA: Metastatic ovarian cancer, status post whole-brain
radiation in [REDACTED]

EXAM:
MRI HEAD WITHOUT AND WITH CONTRAST
TECHNIQUE: Multiplanar, multiecho pulse sequences of the brain and surrounding
structures were obtained without and with intravenous contrast.
CONTRAST:  7mL GADAVIST GADOBUTROL 1 MMOL/ML IV SOLN

[Series 5: DWI · axial · 3.0mm · 1.25mm/px · z∈[-55,+91]mm · 5 of 99 slices shown (1 of 2)]
[im 1/99]
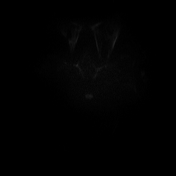
[im 25/99]
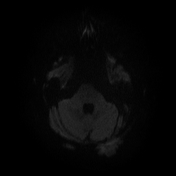
[im 50/99]
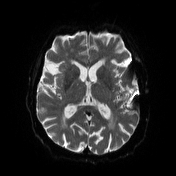
[im 74/99]
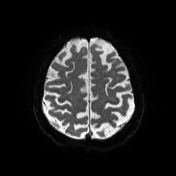
[im 99/99]
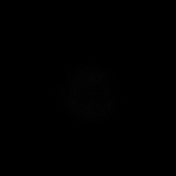

[Series 6: DWI · axial · 3.0mm · 1.25mm/px · z∈[-55,+91]mm · 3 of 50 slices shown (2 of 2)]
[im 1/50]
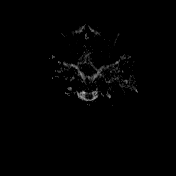
[im 25/50]
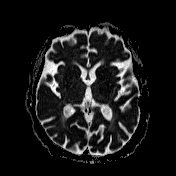
[im 50/50]
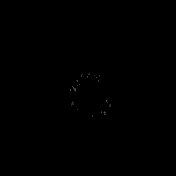

[Series 7: T1 · sagittal · 5.0mm · 0.69mm/px · 1 of 24 slices shown (1 of 4)]
[im 1/24]
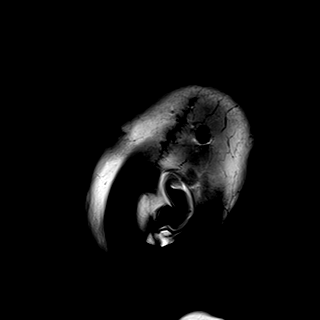

[Series 8: T2 · axial · 5.0mm · 0.57mm/px · 1 of 24 slices shown]
[im 1/24]
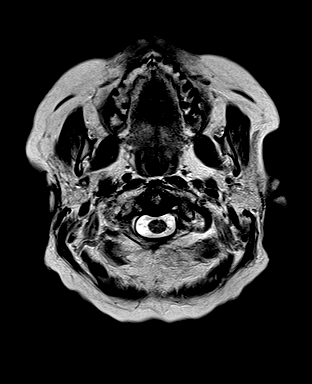

[Series 9: swi_images · axial · 3.0mm · 0.69mm/px · z∈[-64,+100]mm · 3 of 56 slices shown]
[im 1/56]
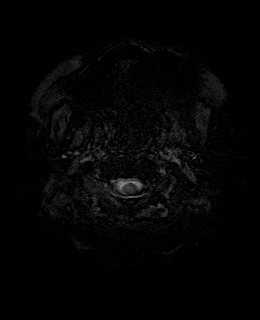
[im 28/56]
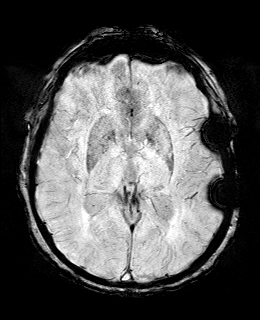
[im 56/56]
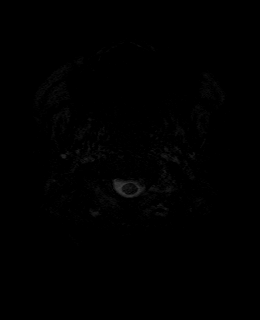

[Series 11: FLAIR · axial · 3.0mm · 0.69mm/px · z∈[-58,+94]mm · 3 of 52 slices shown]
[im 1/52]
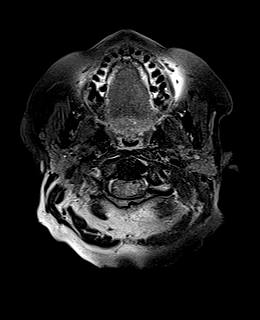
[im 26/52]
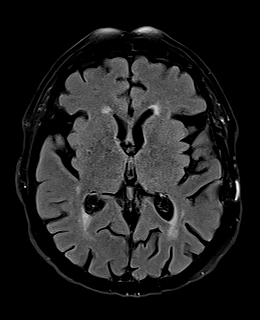
[im 52/52]
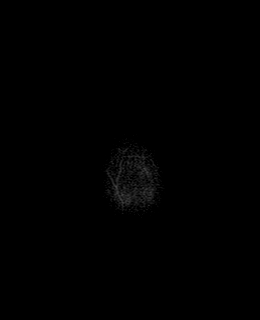

[Series 12: T1 · axial · 1.0mm · 0.86mm/px · z∈[-61,+97]mm · 9 of 160 slices shown (2 of 4)]
[im 1/160]
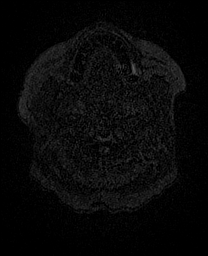
[im 20/160]
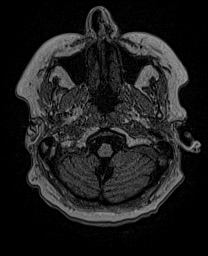
[im 40/160]
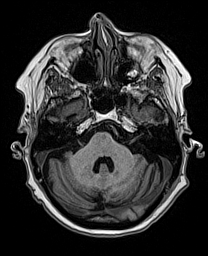
[im 60/160]
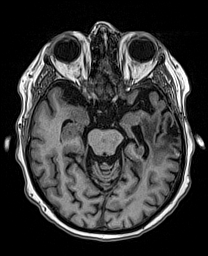
[im 80/160]
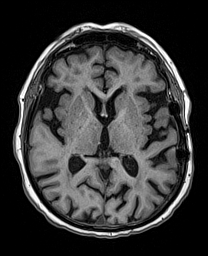
[im 100/160]
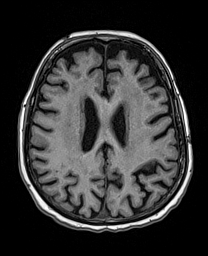
[im 120/160]
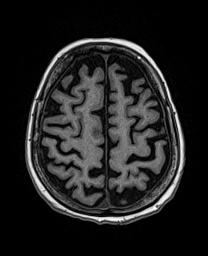
[im 140/160]
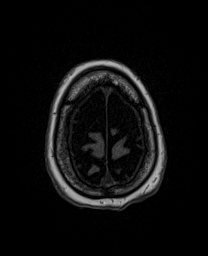
[im 160/160]
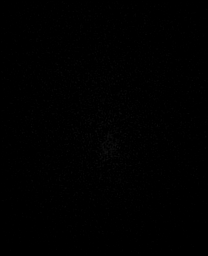

[Series 13: cor dwi_tracew · coronal · 5.0mm · 1.47mm/px · 3 of 56 slices shown]
[im 1/56]
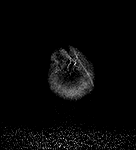
[im 28/56]
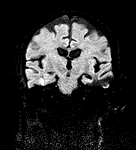
[im 56/56]
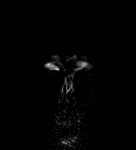

[Series 14: cor dwi_adc · coronal · 5.0mm · 1.47mm/px · 2 of 28 slices shown]
[im 1/28]
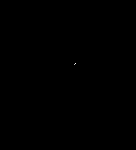
[im 28/28]
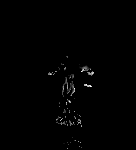

[Series 15: T2 post-contrast · coronal · 5.0mm · 0.57mm/px · 2 of 28 slices shown]
[im 1/28]
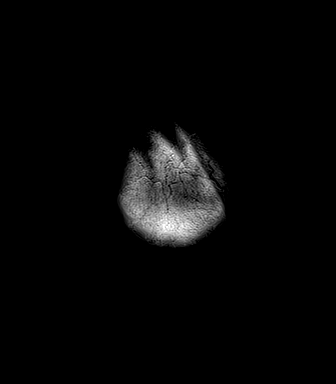
[im 28/28]
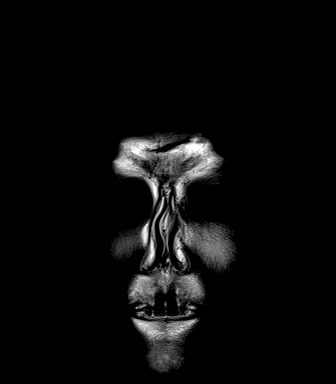

[Series 16: T1 post-contrast · axial · 1.0mm · 0.86mm/px · z∈[-61,+97]mm · 9 of 160 slices shown (1 of 3)]
[im 1/160]
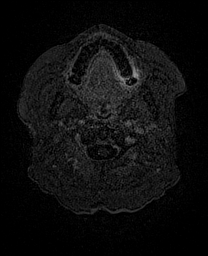
[im 20/160]
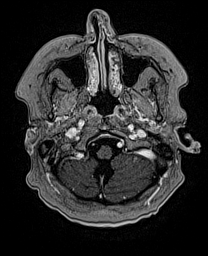
[im 40/160]
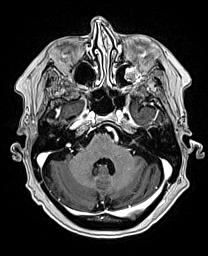
[im 60/160]
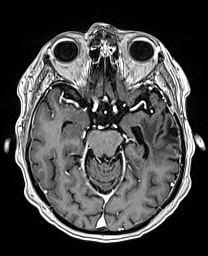
[im 80/160]
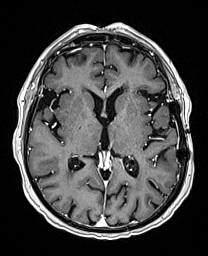
[im 100/160]
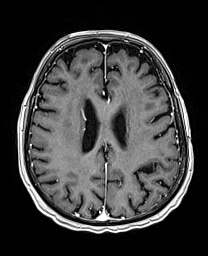
[im 120/160]
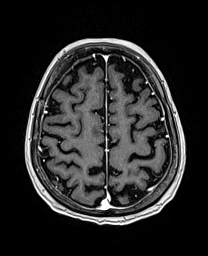
[im 140/160]
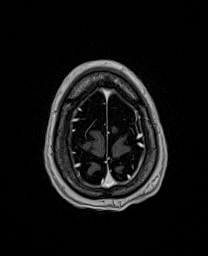
[im 160/160]
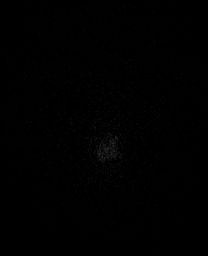

[Series 17: T1 · sagittal · 4.0mm · 0.86mm/px · 2 of 34 slices shown (3 of 4)]
[im 1/34]
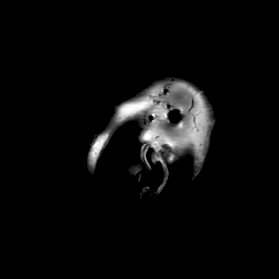
[im 34/34]
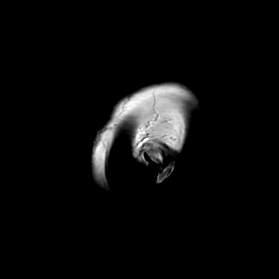

[Series 18: T1 · coronal · 4.0mm · 0.86mm/px · 2 of 41 slices shown (4 of 4)]
[im 1/41]
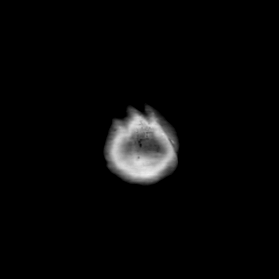
[im 41/41]
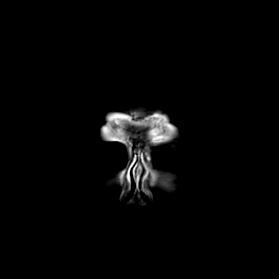

[Series 19: T1 post-contrast · coronal · 5.0mm · 0.43mm/px · 2 of 28 slices shown (2 of 3)]
[im 1/28]
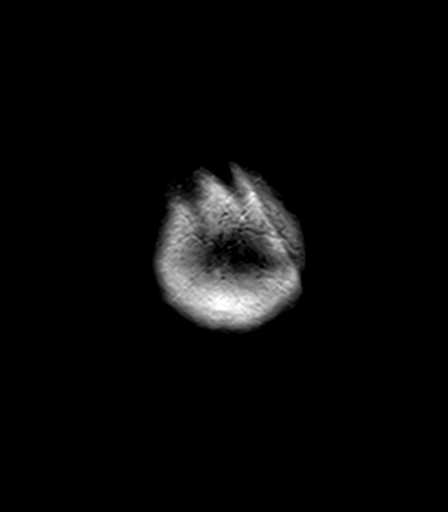
[im 28/28]
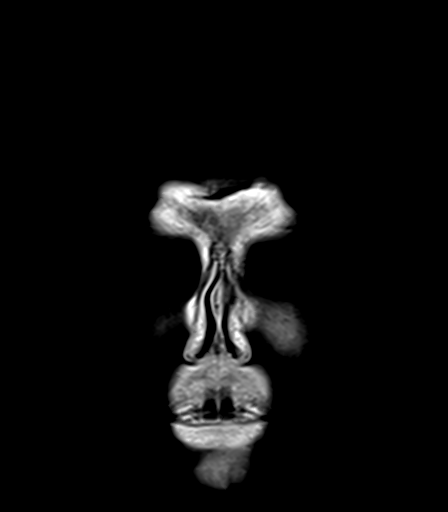

[Series 20: T1 post-contrast · sagittal · 5.0mm · 0.69mm/px · 1 of 24 slices shown (3 of 3)]
[im 1/24]
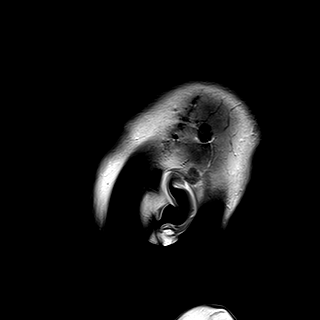

[48 of 48 positions shown; findings below may reference images not displayed]

FINDINGS: Brain: Status post prior left pterional craniotomy with subjacent
resection cavity. Minimal curvilinear enhancement is likely
postoperative. No new or nodular enhancement. Increased adjacent T2
hyperintense signal, which may be related to radiation therapy.

Unchanged focus of left inferior cerebellar leptomeningeal
enhancement (series 16, image 18), measuring up to 5 mm. Additional
enhancement along the left inferior frontal gyrus, superior to the
posterior left orbit is also likely unchanged (series 16, image 66).

No new abnormal intracranial enhancement. No pachymeningeal
thickening. No restricted diffusion to suggest acute infarction or
new focal lesion. No mass effect or midline shift. Ventricles and
sulci are commensurate. T2 hyperintense signal in the
periventricular white matter, likely the sequela of prior radiation
therapy.

Vascular: Normal flow voids.

Skull and upper cervical spine: Status post left pterional
craniotomy. Normal marrow signal.

Sinuses/Orbits: Postoperative changes in the left globe. Otherwise
negative.

Other: Trace fluid in the left-greater-than-right mastoid air cells.
IMPRESSION: 1. Unchanged minimal leptomeningeal enhancement at the inferior left
cerebellum and inferior left frontal gyrus. No new leptomeningeal or
parenchymal disease.
2. Unchanged appearance of the left temporal lobe resection cavity,
with no evidence of disease progression.

## 2021-08-14 MED ORDER — GADOBUTROL 1 MMOL/ML IV SOLN
7.0000 mL | Freq: Once | INTRAVENOUS | Status: AC | PRN
  Administered 2021-08-14: 7 mL via INTRAVENOUS

## 2021-08-16 ENCOUNTER — Other Ambulatory Visit: Payer: Self-pay

## 2021-08-16 ENCOUNTER — Inpatient Hospital Stay: Payer: Medicare Other | Attending: Gynecologic Oncology | Admitting: Internal Medicine

## 2021-08-16 VITALS — BP 120/62 | HR 59 | Temp 97.6°F | Resp 16 | Ht 62.0 in | Wt 154.0 lb

## 2021-08-16 DIAGNOSIS — Z79899 Other long term (current) drug therapy: Secondary | ICD-10-CM | POA: Diagnosis not present

## 2021-08-16 DIAGNOSIS — Z923 Personal history of irradiation: Secondary | ICD-10-CM | POA: Diagnosis not present

## 2021-08-16 DIAGNOSIS — Z7952 Long term (current) use of systemic steroids: Secondary | ICD-10-CM | POA: Insufficient documentation

## 2021-08-16 DIAGNOSIS — C7931 Secondary malignant neoplasm of brain: Secondary | ICD-10-CM | POA: Insufficient documentation

## 2021-08-16 DIAGNOSIS — C569 Malignant neoplasm of unspecified ovary: Secondary | ICD-10-CM | POA: Insufficient documentation

## 2021-08-16 DIAGNOSIS — E039 Hypothyroidism, unspecified: Secondary | ICD-10-CM | POA: Insufficient documentation

## 2021-08-16 DIAGNOSIS — I10 Essential (primary) hypertension: Secondary | ICD-10-CM | POA: Diagnosis not present

## 2021-08-16 DIAGNOSIS — C7949 Secondary malignant neoplasm of other parts of nervous system: Secondary | ICD-10-CM | POA: Diagnosis not present

## 2021-08-16 DIAGNOSIS — E785 Hyperlipidemia, unspecified: Secondary | ICD-10-CM | POA: Diagnosis not present

## 2021-08-16 NOTE — Progress Notes (Signed)
[facility] [place],  [id number] [phone]   Interval Evaluation  Date of Service: 08/16/21 Patient Name: Connie Sharp Patient MRN: [id number] Patient DOB: August 06, 1941 Provider: Ventura Sellers, MD  Identifying Statement:  Connie Sharp is a 80 y.o. female with No diagnosis found.   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  She had abdominal paracentesis at the time   04/28/2017 - 06/10/2017 Chemotherapy   She had 3 cycles of neoadjuvant chemo with carboplatin and paclitaxel in [place]       07/22/2017 Surgery   She had exploratory laparotomy and interval cytoreductive surgery   08/26/2017 - 10/07/2017 Chemotherapy   She received 3 more cycles of postoperative chemotherapy with carboplatin and paclitaxel       02/01/2019 PET scan   Outside PET CT scan showed brain metastasis and cardio phrenic lymph node   02/18/2019 Surgery   She underwent neurosurgical resection of brain mass and postoperative radiation therapy.  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   06/02/2019 PET scan   Repeat outside PET CT scan showed cardiophrenic lymph node unchanged.   08/30/2019 Imaging   Outside MRI late September showed tumor in the roof of the left orbit.   09/06/2019 PET scan   October 2020 PET CT scan abnormalities on the left orbital tumor and stable cardiophrenic nodule.   10/04/2019 Surgery   She underwent radiosurgery for orbital tumor.   11/08/2019 Imaging   Outside follow-up MRI showed reduce in size of the size of  left orbital tumor.   02/07/2020 Imaging   She was noted to have enlarging brain metastasis and receive further radiation treatment.   02/22/2020 - 03/14/2020 Radiation Therapy   She received treatment to left cerebellar lesion, retroclival lesion   05/12/2020 Imaging   Outside MRI and CT scan of the chest, abdomen and pelvis showed disease stability.   08/10/2020 Imaging   Outside CT imaging was reported as stable.  Brain MRI was significant for new infratentorial compartment metastasis.   10/02/2020 -  Radiation Therapy   Date of treatment is unknown but she received further radiation therapy to at least 4 lesions.   10/12/2020 - 02/22/2021 Chemotherapy   She has received concurrent Zejula at 200 mg daily with pembrolizumab       01/10/2021 Imaging   MRI brain done elsewhere showed new leptomeningeal enhancement of the inferior anterior lateral left frontal lobe, new nodular enhancement of the left anterior cingulate gyrus, increased size of nodular leptomeningeal enhancement of bilateral trigeminal nerves, right superior cerebellar and anterior callosal enhancing lesions have increased in size and left vent trolled lateral cerebellar leptomeningeal enhancement is similar compared to before  CT imaging shows stability of cardiophrenic lymph node   01/17/2021 - 01/31/2021 Radiation Therapy   She received whole brain radiation therapy, 30 Gy in 10 fractions   01/31/2021 -  Radiation Therapy   She received whole brain radiation therapy, 30 Gy completed on 01/31/2021   02/21/2021 Tumor Marker   Patient's tumor was tested for the following markers: CA-125 Results of the tumor marker test revealed 58   02/26/2021 Imaging  Outside MRI spine showed leptomeningeal disease in the cervical and thoracic cord, bulky at T1 with significant epidural lesion in that location   03/12/2021 Cancer Staging   Staging form: Ovary, Fallopian Tube, and Primary Peritoneal Carcinoma, AJCC 8th Edition - Clinical stage  from 03/12/2021: Stage IV (rcT3, cN0, pM1) - Signed by Heath Lark, MD on 03/12/2021 Stage prefix: Recurrence   03/12/2021 Tumor Marker   Patient's tumor was tested for the following markers: CA-125 Results of the tumor marker test revealed 45.8     Interval History:  Connie Sharp presents today for follow up after recent MRI brain.  She describes relative stability of symptoms, overall.  Facial and lower leg numbness is reported as stable.  No pain, no conversion to headache.  No weakness in the face or double vision.  Continues to ambulate independently, takes care of ADLs.  Fatigue continues to be a problem, she naps occasionally.  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  She describes ~6 weeks history of numbness in her legs, left more than right.  It comes and goes, moves up as high as her hip, sometimes just in the foot itself.  She also describes clear decline in her balance, she is now often walking with a cane.  At home, going from room to room, she does not need the cane.  Otherwise denies headaches, seizures, double vision, cognitive impairment, dysuria, saddle numbness.  Plan was for spine radiation in [place], but she has moved to [place] to be closer to her family.  Medications: Current Outpatient Medications on File Prior to Visit  Medication Sig Dispense Refill   Cholecalciferol (VITAMIN D3) 50 MCG (2000 UT) TABS Take 1 tablet by mouth daily.     dexamethasone (DECADRON) 1 MG tablet Take 1 tablet (1 mg total) by mouth daily. 30 tablet 3   docusate sodium (COLACE) 100 MG capsule Take 100 mg by mouth 2 (two) times daily.     doxylamine, Sleep, (UNISOM) 25 MG tablet Take 25 mg by mouth at bedtime as needed.     fluticasone (VERAMYST) 27.5 MCG/SPRAY nasal spray Place 2 sprays into the nose daily.     levothyroxine (SYNTHROID) 50 MCG tablet Take 50 mcg by mouth daily before breakfast.      LORazepam (ATIVAN) 1 MG tablet Take 1 tablet (1 mg total) by mouth at bedtime. 60 tablet 0   Melatonin 10 MG TABS Take by mouth.     pantoprazole (PROTONIX) 40 MG tablet Take 40 mg by mouth at bedtime.     polyethylene glycol (MIRALAX / GLYCOLAX) 17 g packet Take 17 g by mouth daily.     propranolol (INNOPRAN XL) 120 MG 24 hr capsule Take 120 mg by mouth daily.     Sennosides (SENNA) 8.6 MG CAPS Take 2 capsules by mouth 2 (two) times daily.     timolol (TIMOPTIC-XR) 0.25 % ophthalmic gel-forming Place 1 drop into both eyes daily.     traZODone (DESYREL) 50 MG tablet Take 0.5-1 tablets (25-50 mg total) by mouth at bedtime. 30 tablet 0   No current facility-administered medications on file prior to visit.    Allergies:  Allergies  Allergen Reactions   Ambien [Zolpidem Tartrate] Other (See Comments)    Delirium   Cephalexin Nausea And Vomiting, Hives and Rash   Cefaclor Other (See Comments) and Hives    unknown   Noroxin [Norfloxacin] Other (See Comments)  unknown   Scallops [Shellfish Allergy] Nausea And Vomiting and Swelling   Past Medical History:  Past Medical History:  Diagnosis Date   Anemia    Atrophic vaginitis 09/29/2012   Brain cancer ([place])    metastasis   Degenerative, intervertebral disc, cervical    Diverticulosis    Fallopian tube cancer, carcinoma ([place]) 11/02/2018   GERD (gastroesophageal reflux disease)    Glaucoma    left eye   Hx of migraines    Hyperlipidemia    Hypertension    Injury of right rotator cuff    Kidney stones    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   Migraine    Osteoporosis 10/18/2011   Ovarian cancer ([place])    Thyroid disease    hypothyroid   Past Surgical History:  Past Surgical History:  Procedure Laterality Date   ABDOMINAL HYSTERECTOMY     1987   BILATERAL SALPINGOOPHORECTOMY     07-22-2017   CHOLECYSTECTOMY  08/02/2017   COLONOSCOPY  08/2015   EYE SURGERY Left 2004   Left eye wrikleremoval   NOSE SURGERY  09/05/2014    Fractured Nose    Plastic reconstructive      Scar tissue left breast at age 50   TONSILLECTOMY AND ADENOIDECTOMY     Social History:  Social History   Socioeconomic History   Marital status: Married    Spouse name: Not on file   Number of children: Not on file   Years of education: Not on file   Highest education level: Not on file  Occupational History   Not on file  Tobacco Use   Smoking status: Former    Packs/day: 0.25    Years: 4.00    Pack years: 1.00    Types: Cigarettes    Quit date: 03/07/1965    Years since quitting: 56.4   Smokeless tobacco: Never  Vaping Use   Vaping Use: Never used  Substance and Sexual Activity   Alcohol use: Yes    Comment: occasionally   Drug use: Never   Sexual activity: Not Currently  Other Topics Concern   Not on file  Social History Narrative   Not on file   Social Determinants of Health   Financial Resource Strain: Not on file  Food Insecurity: Not on file  Transportation Needs: Not on file  Physical Activity: Not on file  Stress: Not on file  Social Connections: Not on file  Intimate Partner Violence: Not At Risk   Fear of Current or Ex-Partner: No   Emotionally Abused: No   Physically Abused: No   Sexually Abused: No   Family History:  Family History  Problem Relation Age of Onset   Hypertension Mother    Congestive Heart Failure Mother    Hypercholesterolemia Mother    Macular degeneration Father    Non-Hodgkin's lymphoma Brother    Colon cancer Neg Hx    Ovarian cancer Neg Hx    Breast cancer Neg Hx    Endometrial cancer Neg Hx    Pancreatic cancer Neg Hx    Prostate cancer Neg Hx     Review of Systems: Constitutional: Doesn't report fevers, chills or abnormal weight loss Eyes: Doesn't report blurriness of vision Ears, nose, mouth, throat, and face: Doesn't report sore throat Respiratory: Doesn't report cough, dyspnea or wheezes Cardiovascular: Doesn't report palpitation, chest discomfort   Gastrointestinal:  Doesn't report nausea, constipation, diarrhea GU: Doesn't report incontinence Skin: Doesn't report skin rashes Neurological: Per HPI Musculoskeletal: Doesn't report joint pain  Behavioral/Psych: Doesn't report anxiety  Physical Exam: Vitals:   08/16/21 1150  BP: 120/62  Pulse: (!) 59  Resp: 16  Temp: 97.6 F (36.4 C)  SpO2: 99%   KPS: 70. General: Alert, cooperative, pleasant, in no acute distress Head: Normal EENT: No conjunctival injection or scleral icterus.  Lungs: Resp effort normal Cardiac: Regular rate Abdomen: Non-distended abdomen Skin: No rashes cyanosis or petechiae. Extremities: No clubbing or edema  Neurologic Exam: Mental Status: Awake, alert, attentive to examiner. Oriented to self and environment. Language is fluent with intact comprehension.  Cranial Nerves: Visual acuity is grossly normal. Visual fields are full. Extra-ocular movements intact. No ptosis. Face is symmetric Motor: Tone and bulk are normal. Power is full in both arms and legs. Reflexes are decreased, no pathologic reflexes present.  Sensory: Impaired in stocking pattern Gait: Wide based gait  Labs: I have reviewed the data as listed    Component Value Date/Time   NA 140 05/22/2021 0935   K 3.5 05/22/2021 0935   CL 107 05/22/2021 0935   CO2 25 05/22/2021 0935   GLUCOSE 107 (H) 05/22/2021 0935   BUN 17 05/22/2021 0935   CREATININE 0.87 05/22/2021 0935   CALCIUM 8.7 (L) 05/22/2021 0935   PROT 6.2 (L) 05/22/2021 0935   ALBUMIN 3.8 05/22/2021 0935   AST 15 05/22/2021 0935   ALT 14 05/22/2021 0935   ALKPHOS 39 05/22/2021 0935   BILITOT 1.4 (H) 05/22/2021 0935   GFRNONAA >60 05/22/2021 0935   Lab Results  Component Value Date   WBC 8.2 05/22/2021   NEUTROABS 7.2 05/22/2021   HGB 12.3 05/22/2021   HCT 37.2 05/22/2021   MCV 103.0 (H) 05/22/2021   PLT 201 05/22/2021   Imaging:  [facility] Clinician Interpretation: I have personally reviewed the CNS images as listed.   My interpretation, in the context of the patient's clinical presentation, is stable disease  MR BRAIN W WO CONTRAST  Result Date: 08/14/2021 CLINICAL DATA:  Metastatic ovarian cancer, status post whole-brain radiation in March EXAM: MRI HEAD WITHOUT AND WITH CONTRAST TECHNIQUE: Multiplanar, multiecho pulse sequences of the brain and surrounding structures were obtained without and with intravenous contrast. CONTRAST:  52m GADAVIST GADOBUTROL 1 MMOL/ML IV SOLN COMPARISON:  None. FINDINGS: Brain: Status post prior left pterional craniotomy with subjacent resection cavity. Minimal curvilinear enhancement is likely postoperative. No new or nodular enhancement. Increased adjacent T2 hyperintense signal, which may be related to radiation therapy. Unchanged focus of left inferior cerebellar leptomeningeal enhancement (series 16, image 18), measuring up to 5 mm. Additional enhancement along the left inferior frontal gyrus, superior to the posterior left orbit is also likely unchanged (series 16, image 66). No new abnormal intracranial enhancement. No pachymeningeal thickening. No restricted diffusion to suggest acute infarction or new focal lesion. No mass effect or midline shift. Ventricles and sulci are commensurate. T2 hyperintense signal in the periventricular white matter, likely the sequela of prior radiation therapy. Vascular: Normal flow voids. Skull and upper cervical spine: Status post left pterional craniotomy. Normal marrow signal. Sinuses/Orbits: Postoperative changes in the left globe. Otherwise negative. Other: Trace fluid in the left-greater-than-right mastoid air cells. IMPRESSION: 1. Unchanged minimal leptomeningeal enhancement at the inferior left cerebellum and inferior left frontal gyrus. No new leptomeningeal or parenchymal disease. 2. Unchanged appearance of the left temporal lobe resection cavity, with no evidence of disease progression. Electronically Signed   By: AMerilyn BabaM.D.   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  No new localizing symptoms.  We recommended continued imaging surveillance, with repeat brain/spine MRI in 3 months.  She is agreeable to this plan.    We appreciate the opportunity to participate in the care of [place].    All questions were answered. The patient knows to call the clinic with any problems, questions or concerns. No barriers to learning were detected.  The total time spent in the encounter was 30 minutes and more than 50% was on counseling and review of test results   Ventura Sellers, MD Medical Director of Neuro-Oncology [facility] [place] 08/16/21 2:41 PM

## 2021-08-17 ENCOUNTER — Telehealth: Payer: Self-pay | Admitting: Internal Medicine

## 2021-08-17 NOTE — Telephone Encounter (Signed)
Scheduled appt per 9/15 los. Pt is aware.  

## 2021-08-20 ENCOUNTER — Inpatient Hospital Stay: Payer: Medicare Other

## 2021-10-09 ENCOUNTER — Other Ambulatory Visit: Payer: Self-pay | Admitting: Hematology and Oncology

## 2021-10-09 NOTE — Telephone Encounter (Signed)
She lives part time in [place] but on hospice Please call her husband and clarify if she indeed needs a refill and where we should send it to

## 2021-10-11 ENCOUNTER — Telehealth: Payer: Self-pay | Admitting: *Deleted

## 2021-10-11 NOTE — Telephone Encounter (Signed)
Ms. Remley called with report of urinary frequency, voiding small amounts, accompanied by stomach discomfort/pain. Symptoms are intermittent, has them for 2-3 days every week for past 3 weeks. She said it starts and about the time she would call doctor's office, the symptoms would go away. She said this last happened several years ago. She said they started again last night, so she thought she should call. Denies fever or blood in urine.   Dr. Alvy Bimler informed and she recommended patient contact [facility] as they provide care for symptom management and may be able to assist patient.  Patient contacted and states she can't find their number. This nurse contacted them on her behalf with information r/t symptoms. Their rep stated someone would call patient this afternoon. Informed patient of this and gave patient their number so she could contact them in future. Patient verbalized understanding of information.

## 2021-10-12 ENCOUNTER — Inpatient Hospital Stay (HOSPITAL_BASED_OUTPATIENT_CLINIC_OR_DEPARTMENT_OTHER): Payer: Medicare Other | Admitting: Hematology and Oncology

## 2021-10-12 ENCOUNTER — Other Ambulatory Visit (HOSPITAL_COMMUNITY): Payer: Self-pay

## 2021-10-12 ENCOUNTER — Other Ambulatory Visit: Payer: Self-pay | Admitting: Hematology and Oncology

## 2021-10-12 ENCOUNTER — Telehealth: Payer: Self-pay

## 2021-10-12 ENCOUNTER — Inpatient Hospital Stay: Payer: Medicare Other | Attending: Gynecologic Oncology

## 2021-10-12 ENCOUNTER — Encounter: Payer: Self-pay | Admitting: Hematology and Oncology

## 2021-10-12 ENCOUNTER — Other Ambulatory Visit: Payer: Self-pay

## 2021-10-12 DIAGNOSIS — Z87440 Personal history of urinary (tract) infections: Secondary | ICD-10-CM | POA: Insufficient documentation

## 2021-10-12 DIAGNOSIS — R300 Dysuria: Secondary | ICD-10-CM

## 2021-10-12 DIAGNOSIS — Z7952 Long term (current) use of systemic steroids: Secondary | ICD-10-CM | POA: Diagnosis not present

## 2021-10-12 DIAGNOSIS — Z923 Personal history of irradiation: Secondary | ICD-10-CM | POA: Insufficient documentation

## 2021-10-12 DIAGNOSIS — R350 Frequency of micturition: Secondary | ICD-10-CM | POA: Insufficient documentation

## 2021-10-12 DIAGNOSIS — C7931 Secondary malignant neoplasm of brain: Secondary | ICD-10-CM | POA: Insufficient documentation

## 2021-10-12 DIAGNOSIS — C569 Malignant neoplasm of unspecified ovary: Secondary | ICD-10-CM

## 2021-10-12 DIAGNOSIS — Z9221 Personal history of antineoplastic chemotherapy: Secondary | ICD-10-CM | POA: Diagnosis not present

## 2021-10-12 DIAGNOSIS — Z79899 Other long term (current) drug therapy: Secondary | ICD-10-CM | POA: Insufficient documentation

## 2021-10-12 LAB — CBC WITH DIFFERENTIAL (CANCER CENTER ONLY)
Abs Immature Granulocytes: 0.06 10*3/uL (ref 0.00–0.07)
Basophils Absolute: 0 10*3/uL (ref 0.0–0.1)
Basophils Relative: 0 %
Eosinophils Absolute: 0.1 10*3/uL (ref 0.0–0.5)
Eosinophils Relative: 1 %
HCT: 41.5 % (ref 36.0–46.0)
Hemoglobin: 13.9 g/dL (ref 12.0–15.0)
Immature Granulocytes: 1 %
Lymphocytes Relative: 7 %
Lymphs Abs: 0.6 10*3/uL — ABNORMAL LOW (ref 0.7–4.0)
MCH: 32 pg (ref 26.0–34.0)
MCHC: 33.5 g/dL (ref 30.0–36.0)
MCV: 95.6 fL (ref 80.0–100.0)
Monocytes Absolute: 0.5 10*3/uL (ref 0.1–1.0)
Monocytes Relative: 6 %
Neutro Abs: 7.7 10*3/uL (ref 1.7–7.7)
Neutrophils Relative %: 85 %
Platelet Count: 198 10*3/uL (ref 150–400)
RBC: 4.34 MIL/uL (ref 3.87–5.11)
RDW: 13.5 % (ref 11.5–15.5)
WBC Count: 9 10*3/uL (ref 4.0–10.5)
nRBC: 0 % (ref 0.0–0.2)

## 2021-10-12 LAB — URINALYSIS, COMPLETE (UACMP) WITH MICROSCOPIC
Bilirubin Urine: NEGATIVE
Glucose, UA: NEGATIVE mg/dL
Ketones, ur: NEGATIVE mg/dL
Nitrite: NEGATIVE
Protein, ur: NEGATIVE mg/dL
Specific Gravity, Urine: 1.009 (ref 1.005–1.030)
WBC, UA: >50 WBC/hpf — ABNORMAL HIGH (ref 0–5)
pH: 5 (ref 5.0–8.0)

## 2021-10-12 LAB — CMP (CANCER CENTER ONLY)
ALT: 12 U/L (ref 0–44)
AST: 13 U/L — ABNORMAL LOW (ref 15–41)
Albumin: 3.7 g/dL (ref 3.5–5.0)
Alkaline Phosphatase: 53 U/L (ref 38–126)
Anion gap: 8 (ref 5–15)
BUN: 11 mg/dL (ref 8–23)
CO2: 28 mmol/L (ref 22–32)
Calcium: 8.5 mg/dL — ABNORMAL LOW (ref 8.9–10.3)
Chloride: 106 mmol/L (ref 98–111)
Creatinine: 0.85 mg/dL (ref 0.44–1.00)
GFR, Estimated: >60 mL/min (ref >60)
Glucose, Bld: 87 mg/dL (ref 70–99)
Potassium: 3.4 mmol/L — ABNORMAL LOW (ref 3.5–5.1)
Sodium: 142 mmol/L (ref 135–145)
Total Bilirubin: 1.1 mg/dL (ref 0.3–1.2)
Total Protein: 6.4 g/dL — ABNORMAL LOW (ref 6.5–8.1)

## 2021-10-12 MED ORDER — AMOXICILLIN 250 MG PO CAPS
500.0000 mg | ORAL_CAPSULE | Freq: Two times a day (BID) | ORAL | 0 refills | Status: DC
  Filled 2021-10-12: qty 14, 4d supply, fill #0

## 2021-10-12 NOTE — Telephone Encounter (Signed)
Returned patient's call regarding ongoing UTI symptoms. Per Dr. Alvy Bimler, patient is to come into the office to have labs drawn and to provide a urine specimen. Patient will also be seen by Dr. Alvy Bimler. Patient made aware and agrees to come in.  Appointments scheduled accordingly and orders placed.

## 2021-10-12 NOTE — Telephone Encounter (Signed)
Per Dr. Alvy Bimler request, new patient referral made for palliative care service with [facility]. [facility] will reach out to patient to set-up initial visit.

## 2021-10-12 NOTE — Telephone Encounter (Signed)
Palliative care SW returned call to patient per her request. Patient explained that she thinks she has a UTI and wanted to know where she could go to get a urinalysis. SW advised that she could try making an appointment with her primary care provider, or going to an urgent care or center. SW also suggested that she try [facility]. Patient thanked SW for returning her call and advised that she will probably try to go to an urgent care.

## 2021-10-12 NOTE — Assessment & Plan Note (Signed)
She has history of recurrent UTI with multiple drug sensitivities I explained to the patient and husband the rationale of ordering urinalysis and urine culture I will put her on empiric antibiotics based on her prior culture and sensitivity We will call her on Monday to check on her

## 2021-10-12 NOTE — Progress Notes (Signed)
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 Mickeal Skinner in neuro oncology She is doing well on low-dose dexamethasone Unfortunately, she is not getting adequate care under the current palliative care team and she would like to switch and be referred to another team I will make a referral today  Dysuria She has history of recurrent UTI with multiple drug sensitivities I explained to the patient and husband the rationale of ordering urinalysis and urine culture I will put her on empiric antibiotics based on her prior culture and sensitivity We will call her on Monday to check on her  No orders of the defined types were placed in this encounter.   All questions were answered. The patient knows to call the clinic with any problems, questions or concerns. The total time spent in the appointment was 20 minutes encounter with patients including review of chart and various tests results, discussions about plan of care and coordination of care plan   Heath Lark, MD 10/12/2021 5:05 PM  INTERVAL HISTORY: Please see below for problem oriented charting. she returns for urgent evaluation The patient is here accompanied by her husband She had history of recurrent UTI in the past She has been complaining of 2 days history of urinary frequency and dysuria When she attempted to call palliative care for help, she received a voicemail stating that there were no longer take any voicemail for the day She was extremely disappointed at the lack of service Denies fever or chills No recent flank pain  REVIEW OF SYSTEMS:   Constitutional: Denies fevers, chills or abnormal weight loss Eyes: Denies blurriness of vision Ears, nose, mouth, throat, and face: Denies mucositis or sore throat Respiratory: Denies cough, dyspnea or wheezes Cardiovascular: Denies  palpitation, chest discomfort or lower extremity swelling Gastrointestinal:  Denies nausea, heartburn or change in bowel habits Skin: Denies abnormal skin rashes Lymphatics: Denies new lymphadenopathy or easy bruising Neurological:Denies numbness, tingling or new weaknesses Behavioral/Psych: Mood is stable, no new changes  All other systems were reviewed with the patient and are negative.  I have reviewed the past medical history, past surgical history, social history and family history with the patient and they are unchanged from previous note.  ALLERGIES:  is allergic to [employer] tartrate], cephalexin, cefaclor, noroxin [norfloxacin], and scallops [shellfish allergy].  MEDICATIONS:  Current Outpatient Medications  Medication Sig Dispense Refill   amoxicillin (AMOXIL) 250 MG capsule Take 2 capsules (500 mg total) by mouth 2 (two) times daily. 14 capsule 0   Cholecalciferol (VITAMIN D3) 50 MCG (2000 UT) TABS Take 1 tablet by mouth daily.     dexamethasone (DECADRON) 1 MG tablet Take 1 tablet (1 mg total) by mouth daily. 30 tablet 3   docusate sodium (COLACE) 100 MG capsule Take 100 mg by mouth 2 (two) times daily.     doxylamine, Sleep, (UNISOM) 25 MG tablet Take 25 mg by mouth at bedtime as needed.     fluticasone (VERAMYST) 27.5 MCG/SPRAY nasal spray Place 2 sprays into the nose daily.     levothyroxine (SYNTHROID) 50 MCG tablet Take 50 mcg by mouth daily before breakfast.     LORazepam (ATIVAN) 1 MG tablet TAKE 1 TABLET BY MOUTH AT BEDTIME. 30 tablet 1   Melatonin 10 MG TABS Take by mouth.     pantoprazole (PROTONIX) 40 MG tablet Take 40 mg  by mouth at bedtime.     polyethylene glycol (MIRALAX / GLYCOLAX) 17 g packet Take 17 g by mouth daily.     propranolol (INNOPRAN XL) 120 MG 24 hr capsule Take 120 mg by mouth daily.     Sennosides (SENNA) 8.6 MG CAPS Take 2 capsules by mouth 2 (two) times daily.     timolol (TIMOPTIC-XR) 0.25 % ophthalmic gel-forming Place 1 drop into both  eyes daily.     traZODone (DESYREL) 50 MG tablet Take 0.5-1 tablets (25-50 mg total) by mouth at bedtime. 30 tablet 0   No current facility-administered medications for this visit.    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  She had abdominal paracentesis at the time   04/28/2017 - 06/10/2017 Chemotherapy   She had 3 cycles of neoadjuvant chemo with carboplatin and paclitaxel in [place]       07/22/2017 Surgery   She had exploratory laparotomy and interval cytoreductive surgery   08/26/2017 - 10/07/2017 Chemotherapy   She received 3 more cycles of postoperative chemotherapy with carboplatin and paclitaxel       02/01/2019 PET scan   Outside PET CT scan showed brain metastasis and cardio phrenic lymph node   02/18/2019 Surgery   She underwent neurosurgical resection of brain mass and postoperative radiation therapy.  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   06/02/2019 PET scan   Repeat outside PET CT scan showed cardiophrenic lymph node unchanged.   08/30/2019 Imaging   Outside MRI late September showed tumor in the roof of the left orbit.   09/06/2019 PET scan   October 2020 PET CT scan abnormalities on the left orbital tumor and stable cardiophrenic nodule.   10/04/2019 Surgery   She underwent radiosurgery for orbital tumor.   11/08/2019 Imaging   Outside follow-up MRI showed reduce in size of the size of left orbital tumor.   02/07/2020 Imaging   She was noted to have enlarging brain metastasis and receive further radiation treatment.   02/22/2020 - 03/14/2020 Radiation Therapy   She received  treatment to left cerebellar lesion, retroclival lesion   05/12/2020 Imaging   Outside MRI and CT scan of the chest, abdomen and pelvis showed disease stability.   08/10/2020 Imaging   Outside CT imaging was reported as stable.  Brain MRI was significant for new infratentorial compartment metastasis.   10/02/2020 -  Radiation Therapy   Date of treatment is unknown but she received further radiation therapy to at least 4 lesions.   10/12/2020 - 02/22/2021 Chemotherapy   She has received concurrent Zejula at 200 mg daily with pembrolizumab       01/10/2021 Imaging   MRI brain done elsewhere showed new leptomeningeal enhancement of the inferior anterior lateral left frontal lobe, new nodular enhancement of the left anterior cingulate gyrus, increased size of nodular leptomeningeal enhancement of bilateral trigeminal nerves, right superior cerebellar and anterior callosal enhancing lesions have increased in size and left vent trolled lateral cerebellar leptomeningeal enhancement is similar compared to before  CT imaging shows stability of cardiophrenic lymph node   01/17/2021 - 01/31/2021 Radiation Therapy   She received whole brain radiation therapy, 30 Gy in 10 fractions  01/31/2021 -  Radiation Therapy   She received whole brain radiation therapy, 30 Gy completed on 01/31/2021   02/21/2021 Tumor Marker   Patient's tumor was tested for the following markers: CA-125 Results of the tumor marker test revealed 58   02/26/2021 Imaging   Outside MRI spine showed leptomeningeal disease in the cervical and thoracic cord, bulky at T1 with significant epidural lesion in that location   03/12/2021 Cancer Staging   Staging form: Ovary, Fallopian Tube, and Primary Peritoneal Carcinoma, AJCC 8th Edition - Clinical stage from 03/12/2021: Stage IV (rcT3, cN0, pM1) - Signed by Heath Lark, MD on 03/12/2021 Stage prefix: Recurrence    03/12/2021 Tumor Marker   Patient's tumor was tested for the following markers:  CA-125 Results of the tumor marker test revealed 45.8     PHYSICAL EXAMINATION: ECOG PERFORMANCE STATUS: 1 - Symptomatic but completely ambulatory  Vitals:   10/12/21 1347  BP: (!) 156/62  Pulse: 62  Resp: 18  Temp: 97.7 F (36.5 C)  SpO2: 98%   Filed Weights   10/12/21 1347  Weight: 157 lb 6.4 oz (71.4 kg)    GENERAL:alert, no distress and comfortable NEURO: alert & oriented x 3 with fluent speech, no focal motor/sensory deficits  LABORATORY DATA:  I have reviewed the data as listed    Component Value Date/Time   NA 142 10/12/2021 1322   K 3.4 (L) 10/12/2021 1322   CL 106 10/12/2021 1322   CO2 28 10/12/2021 1322   GLUCOSE 87 10/12/2021 1322   BUN 11 10/12/2021 1322   CREATININE 0.85 10/12/2021 1322   CALCIUM 8.5 (L) 10/12/2021 1322   PROT 6.4 (L) 10/12/2021 1322   ALBUMIN 3.7 10/12/2021 1322   AST 13 (L) 10/12/2021 1322   ALT 12 10/12/2021 1322   ALKPHOS 53 10/12/2021 1322   BILITOT 1.1 10/12/2021 1322   GFRNONAA >60 10/12/2021 1322    No results found for: SPEP, UPEP  Lab Results  Component Value Date   WBC 9.0 10/12/2021   NEUTROABS 7.7 10/12/2021   HGB 13.9 10/12/2021   HCT 41.5 10/12/2021   MCV 95.6 10/12/2021   PLT 198 10/12/2021      Chemistry      Component Value Date/Time   NA 142 10/12/2021 1322   K 3.4 (L) 10/12/2021 1322   CL 106 10/12/2021 1322   CO2 28 10/12/2021 1322   BUN 11 10/12/2021 1322   CREATININE 0.85 10/12/2021 1322      Component Value Date/Time   CALCIUM 8.5 (L) 10/12/2021 1322   ALKPHOS 53 10/12/2021 1322   AST 13 (L) 10/12/2021 1322   ALT 12 10/12/2021 1322   BILITOT 1.1 10/12/2021 1322

## 2021-10-12 NOTE — Assessment & Plan Note (Signed)
She has appointment and follow-up with Dr. Mickeal Skinner in neuro oncology She is doing well on low-dose dexamethasone Unfortunately, she is not getting adequate care under the current palliative care team and she would like to switch and be referred to another team I will make a referral today

## 2021-10-14 LAB — URINE CULTURE: Culture: 50000 — AB

## 2021-10-15 ENCOUNTER — Telehealth: Payer: Self-pay | Admitting: *Deleted

## 2021-10-15 NOTE — Telephone Encounter (Signed)
Contacted patient at Dr. Alvy Bimler request. Per her husband, she was not able to come to phone at time of call.  He stated she is taking antibiotics prescribed for UTI and says she told him she is feeling better.  Asked him to remind her to finish course of antibiotics and stay hydrated. He states he will tell her. Encouraged to contact office for concerns or questions.

## 2021-11-01 ENCOUNTER — Other Ambulatory Visit: Payer: Self-pay | Admitting: Hematology and Oncology

## 2021-11-01 ENCOUNTER — Telehealth: Payer: Self-pay | Admitting: *Deleted

## 2021-11-01 DIAGNOSIS — N3000 Acute cystitis without hematuria: Secondary | ICD-10-CM

## 2021-11-01 DIAGNOSIS — N390 Urinary tract infection, site not specified: Secondary | ICD-10-CM | POA: Insufficient documentation

## 2021-11-01 MED ORDER — AMOXICILLIN 500 MG PO TABS: 500.0000 mg | ORAL_TABLET | Freq: Two times a day (BID) | ORAL | 0 refills | Status: DC

## 2021-11-01 NOTE — Telephone Encounter (Signed)
Pt called with c/o of reoccurring UTI. Made provider aware. Prescription for amoxicillin was sent to pt preferred pharmacy. Pt made aware and verbalized understanding.

## 2021-11-05 ENCOUNTER — Other Ambulatory Visit: Payer: Self-pay

## 2021-11-05 NOTE — Telephone Encounter (Signed)
Pt called requesting refill on propanolol 120 mg. Pt states she takes this BID but past sig does not reflect that dose. I also do not see where Dr Alvy Bimler as prescribed this medication. Will route to MD for further review.

## 2021-11-06 ENCOUNTER — Other Ambulatory Visit: Payer: Self-pay | Admitting: Radiation Therapy

## 2021-11-06 MED ORDER — PROPRANOLOL HCL ER BEADS 120 MG PO CP24: 120.0000 mg | ORAL_CAPSULE | Freq: Every day | ORAL | 1 refills | Status: DC

## 2021-11-12 ENCOUNTER — Ambulatory Visit (HOSPITAL_COMMUNITY)
Admission: RE | Admit: 2021-11-12 | Discharge: 2021-11-12 | Disposition: A | Discharge status: Home / Self Care | Payer: Medicare Other | Source: Ambulatory Visit | Attending: Internal Medicine | Admitting: Internal Medicine

## 2021-11-12 ENCOUNTER — Other Ambulatory Visit: Payer: Self-pay

## 2021-11-12 DIAGNOSIS — C7949 Secondary malignant neoplasm of other parts of nervous system: Secondary | ICD-10-CM | POA: Insufficient documentation

## 2021-11-12 IMAGING — MR MR TOTAL SPINE METS SCREENING
15 of 18 series · 34 of 48 positions shown · IV contrast (gadavist)
Comparison: [DATE]

CLINICAL DATA: Metastatic disease evaluation leptomeningeal
disease.

EXAM:
MRI TOTAL SPINE WITHOUT AND WITH CONTRAST
TECHNIQUE: Multisequence MR imaging of the spine from the cervical spine to the
sacrum was performed prior to and following IV contrast
administration for evaluation of spinal metastatic disease.
CONTRAST:  7mL GADAVIST GADOBUTROL 1 MMOL/ML IV SOLN

[Series 16: T1 · sagittal · 4.0mm · 1.15mm/px · 1 of 8 slices shown (1 of 3)]
[im 1/8]
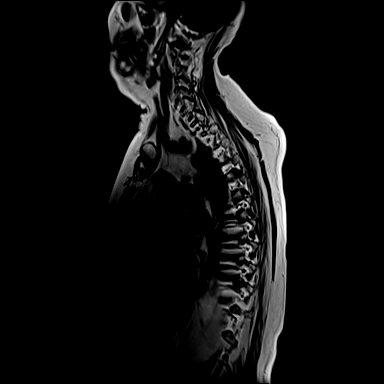

[Series 19: T1 · sagittal · 4.0mm · 1.00mm/px · 1 of 13 slices shown (2 of 3)]
[im 1/13]
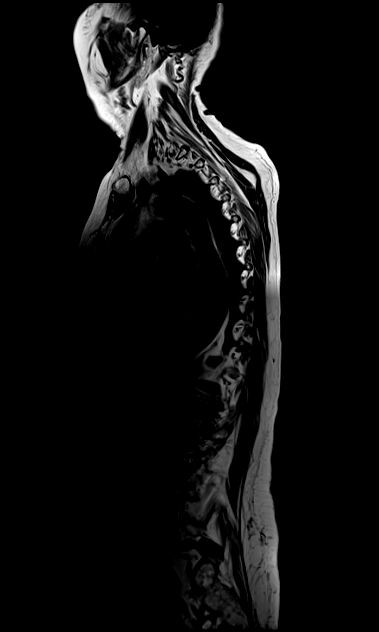

[Series 20: T1 · sagittal · 4.0mm · 1.00mm/px · 2 of 13 slices shown (3 of 3)]
[im 1/13]
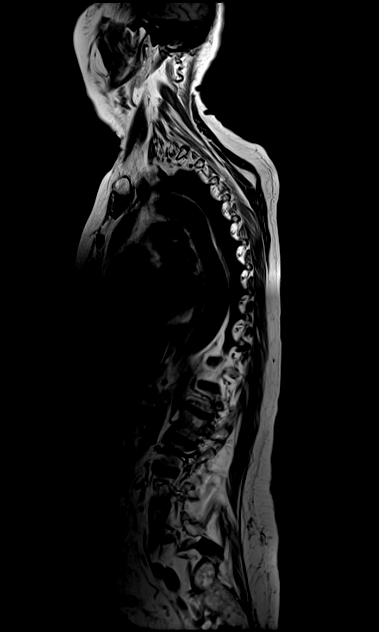
[im 13/13]
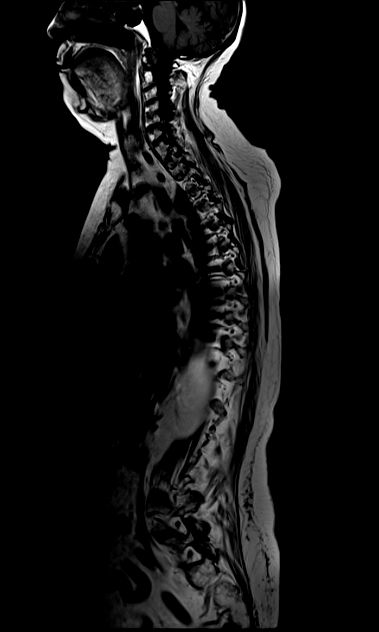

[Series 21: STIR · sagittal · 3.0mm · 1.00mm/px · 3 of 20 slices shown (1 of 2)]
[im 1/20]
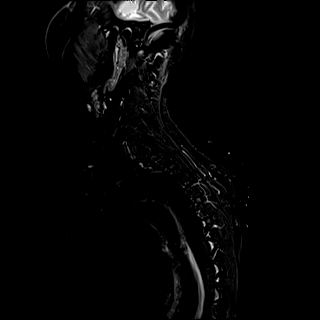
[im 10/20]
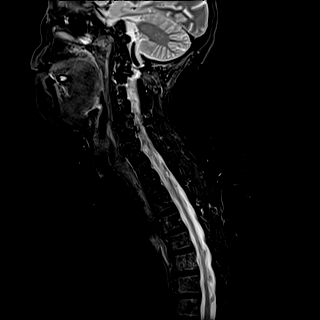
[im 20/20]
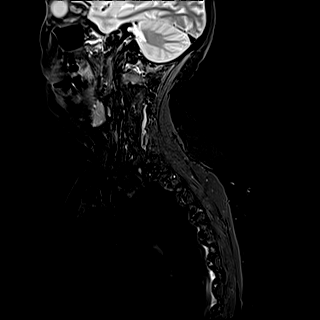

[Series 22: STIR · sagittal · 3.0mm · 1.00mm/px · 1 of 20 slices shown (2 of 2)]
[im 1/20]
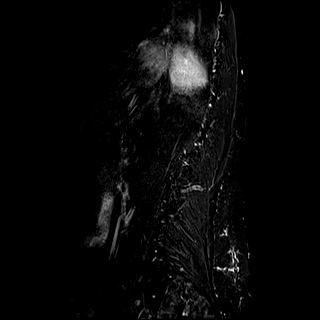

[Series 25: T1 fat-sat post-contrast · sagittal · 3.0mm · 1.00mm/px · 3 of 20 slices shown (1 of 4)]
[im 1/20]
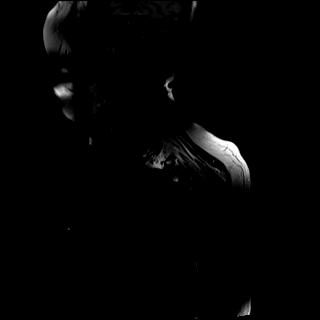
[im 10/20]
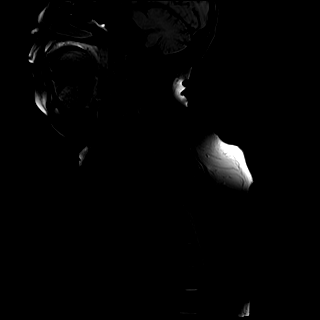
[im 20/20]
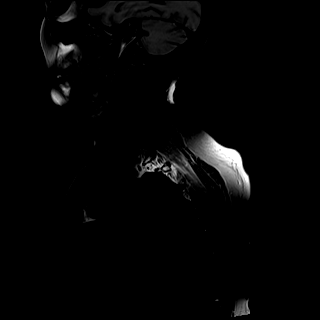

[Series 26: T1 fat-sat post-contrast · sagittal · 4.0mm · 1.19mm/px · 3 of 20 slices shown (2 of 4)]
[im 1/20]
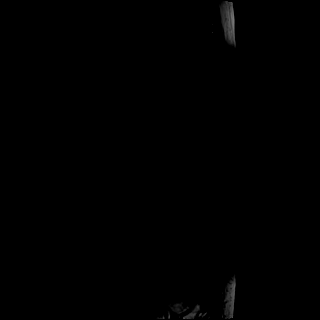
[im 10/20]
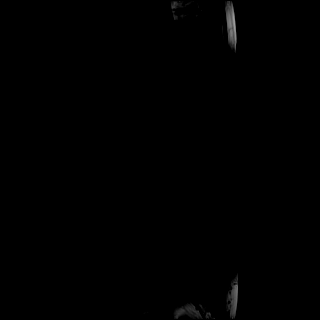
[im 20/20]
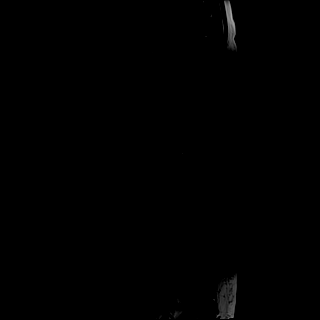

[Series 28: T1 fat-sat · sagittal · 3.6mm · 1.00mm/px · 3 of 18 slices shown (1 of 2)]
[im 1/18]
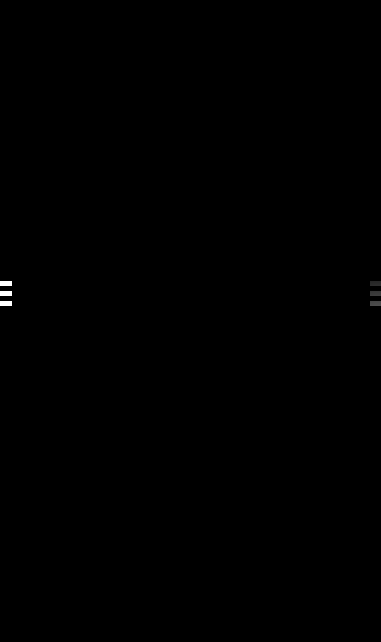
[im 9/18]
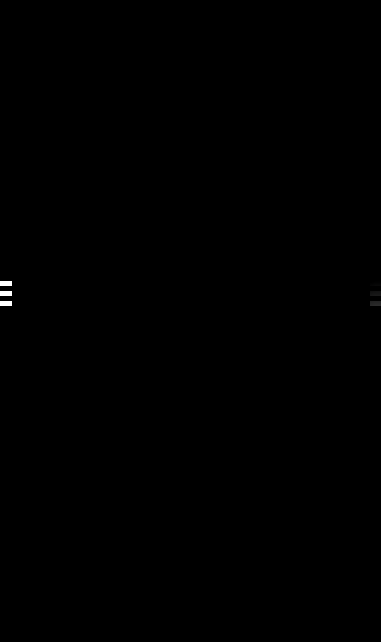
[im 18/18]
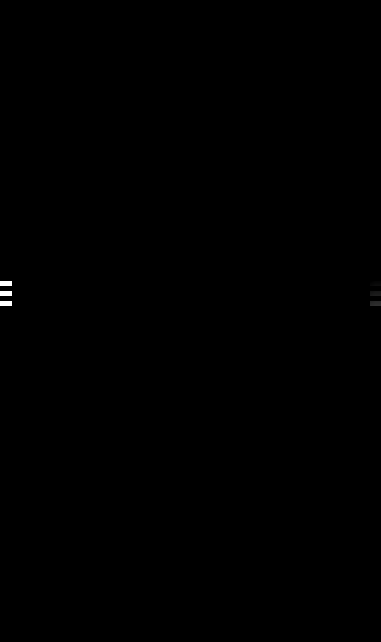

[Series 29: T1 fat-sat post-contrast · sagittal · 4.0mm · 1.00mm/px · 2 of 15 slices shown (3 of 4)]
[im 1/15]
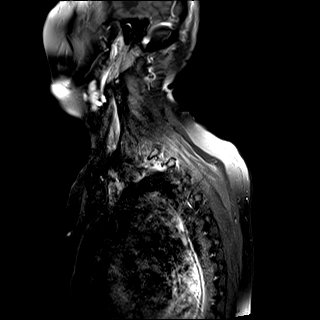
[im 15/15]
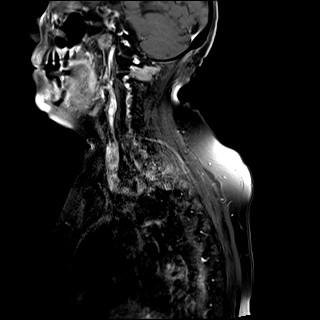

[Series 30: T1 fat-sat post-contrast · sagittal · 4.0mm · 1.19mm/px · 2 of 15 slices shown (4 of 4)]
[im 1/15]
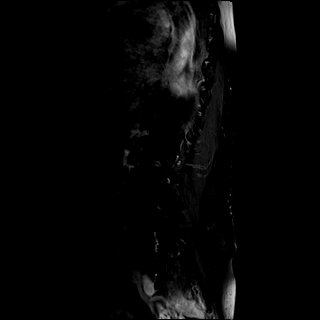
[im 15/15]
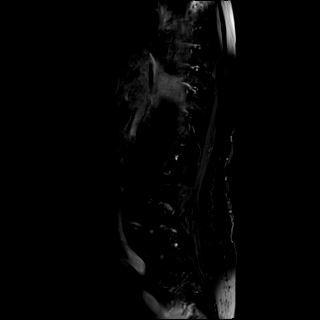

[Series 32: T1 fat-sat · sagittal · 4.0mm · 1.00mm/px · 2 of 13 slices shown (2 of 2)]
[im 1/13]
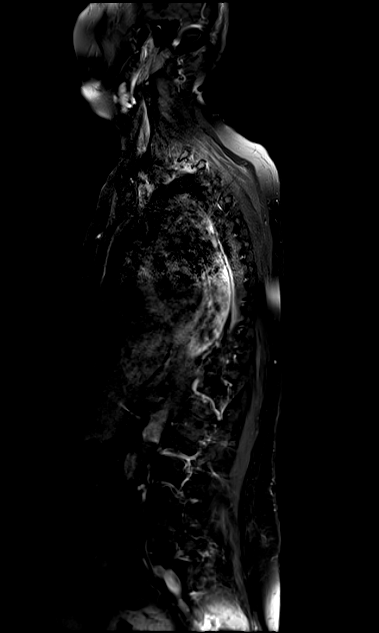
[im 13/13]
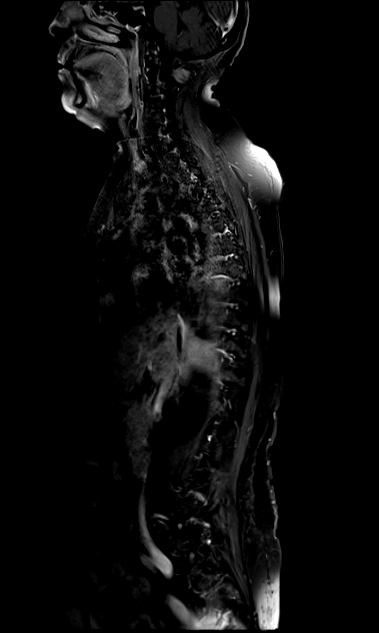

[Series 33: T2 post-contrast · sagittal · 4.0mm · 0.83mm/px · 2 of 15 slices shown (1 of 4)]
[im 1/15]
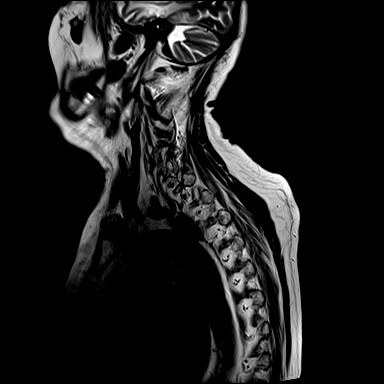
[im 15/15]
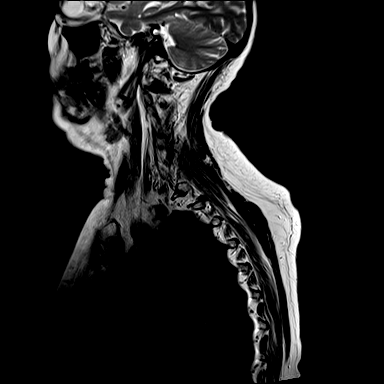

[Series 34: T2 post-contrast · sagittal · 4.0mm · 0.83mm/px · 2 of 15 slices shown (2 of 4)]
[im 1/15]
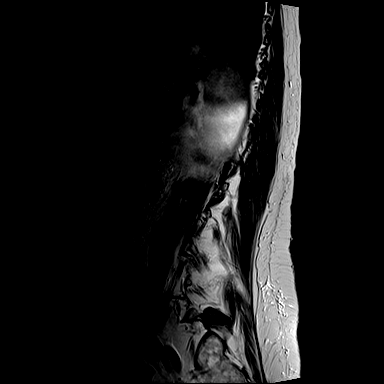
[im 15/15]
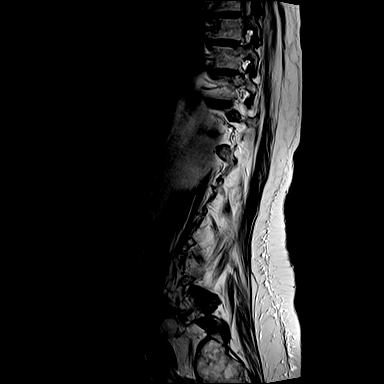

[Series 35: T2 post-contrast · sagittal · 4.0mm · 0.83mm/px · 1 of 9 slices shown (3 of 4)]
[im 1/9]
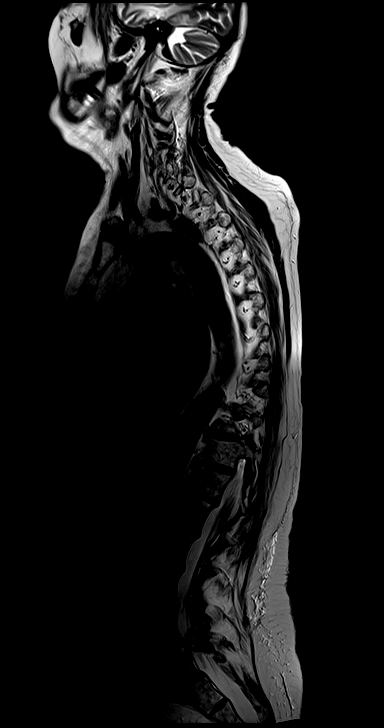

[Series 38: T2 post-contrast · axial · 4.0mm · 0.62mm/px · z∈[-323,-146]mm · 6 of 37 slices shown (4 of 4)]
[im 1/37]
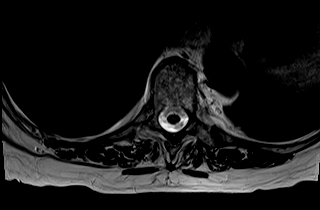
[im 8/37]
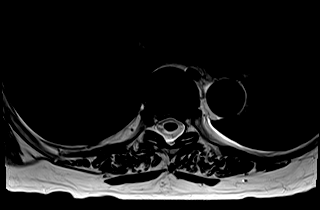
[im 15/37]
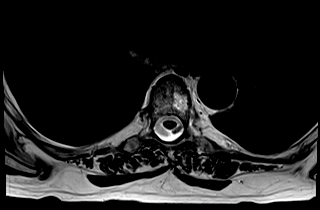
[im 22/37]
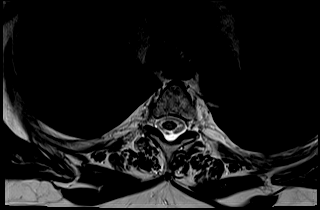
[im 29/37]
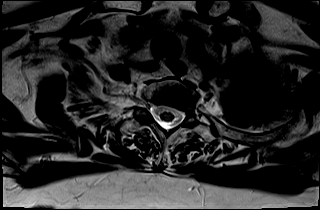
[im 37/37]
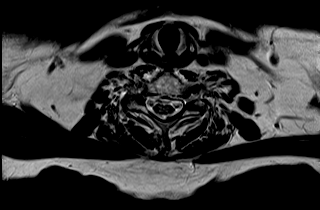

[34 of 48 positions shown; findings below may reference images not displayed]

FINDINGS: MRI CERVICAL SPINE FINDINGS

Alignment: Grade 1 anterolisthesis at C2-3.

Vertebrae: No fracture, evidence of discitis, or bone lesion.

Cord: Normal signal and morphology.

Disc levels:

Multilevel degenerative disc disease, greatest at C4-6 without
high-grade spinal canal stenosis.

MRI THORACIC SPINE FINDINGS

Alignment:  Physiologic.

Vertebrae: T6 hemangioma.  Otherwise unremarkable.

Cord: There is a leptomeningeal focus of contrast enhancement on the
dorsal aspect of the spinal cord at the T1 level (series 29, image 9
and series 37 image 8). The craniocaudal length of the lesion has
decreased. No other abnormal contrast enhancement.

Paraspinal and other soft tissues: Unremarkable

Disc levels:

No spinal canal stenosis

MRI LUMBAR SPINE FINDINGS

Segmentation:  Standard

Alignment:  Grade 1 anterolisthesis at L4-5

Vertebrae:  No fracture, evidence of discitis, or bone lesion.

Conus medullaris: Extends to the L1 level and appears normal.

Paraspinal and other soft tissues: Negative.

Disc levels:

No spinal canal stenosis. No nerve root impingement. Lower lumbar
facet arthrosis is moderate-to-severe, particularly at L4-5.
IMPRESSION: 1. Small leptomeningeal focus of contrast enhancement on the dorsal
aspect of the spinal cord at the T1 level, decreased in craniocaudal
extent since [DATE].
[DATE]. No other leptomeningeal metastatic disease.
3. Mild cervical degenerative disc disease without high-grade spinal
canal stenosis.
4. Lower lumbar facet arthrosis, which may be a source of local low
back pain.

## 2021-11-12 IMAGING — MR MR HEAD WO/W CM
13 series · 48 of 48 positions shown · IV contrast (gadavist)
Comparison: MRI of the brain [DATE].

CLINICAL DATA: Brain/CNS neoplasm, assess for treatment response.
Leptomeningeal metastasis.

EXAM:
MRI HEAD WITHOUT AND WITH CONTRAST
TECHNIQUE: Multiplanar, multiecho pulse sequences of the brain and surrounding
structures were obtained without and with intravenous contrast.
CONTRAST:  7mL GADAVIST GADOBUTROL 1 MMOL/ML IV SOLN

[Series 5: DWI · axial · 3.0mm · 1.36mm/px · z∈[-44,+95]mm · 7 of 96 slices shown (1 of 2)]
[im 1/96]
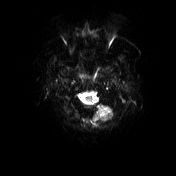
[im 16/96]
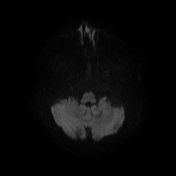
[im 32/96]
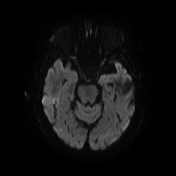
[im 48/96]
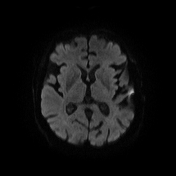
[im 64/96]
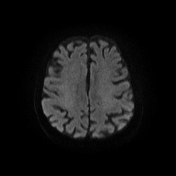
[im 80/96]
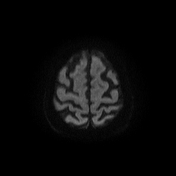
[im 96/96]
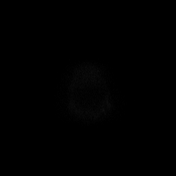

[Series 6: DWI · axial · 3.0mm · 1.36mm/px · z∈[-44,+95]mm · 3 of 48 slices shown (2 of 2)]
[im 1/48]
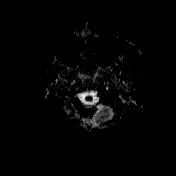
[im 24/48]
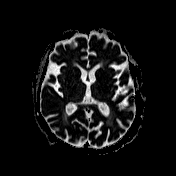
[im 48/48]
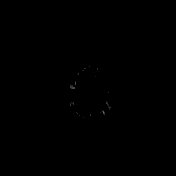

[Series 7: T1 · sagittal · 5.0mm · 0.75mm/px · 1 of 24 slices shown (1 of 2)]
[im 1/24]
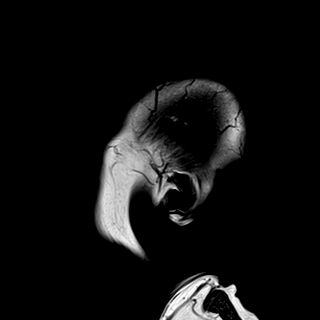

[Series 8: T2 · axial · 5.0mm · 0.62mm/px · 1 of 24 slices shown (1 of 2)]
[im 1/24]
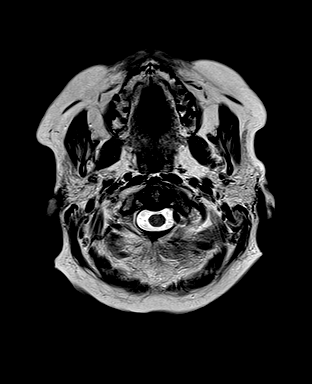

[Series 9: swi_images · axial · 3.0mm · 0.75mm/px · z∈[-49,+103]mm · 3 of 52 slices shown]
[im 1/52]
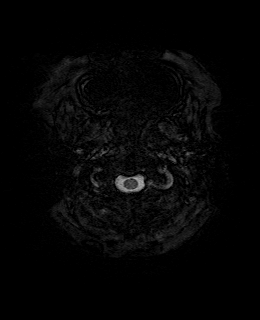
[im 26/52]
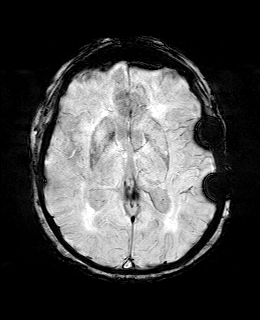
[im 52/52]
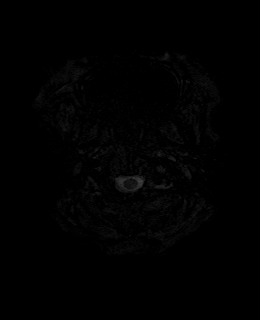

[Series 11: FLAIR · axial · 3.0mm · 0.75mm/px · z∈[-49,+103]mm · 3 of 52 slices shown]
[im 1/52]
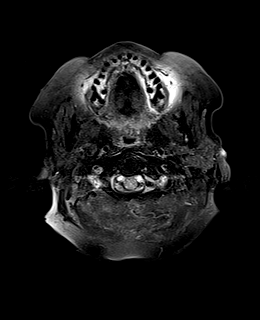
[im 26/52]
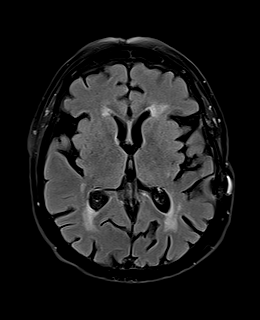
[im 52/52]
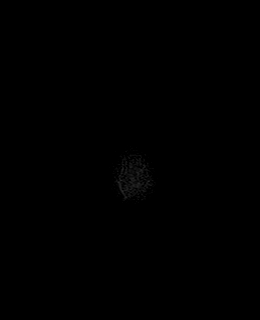

[Series 12: T1 · axial · 1.0mm · 0.94mm/px · z∈[-52,+106]mm · 10 of 160 slices shown (2 of 2)]
[im 1/160]
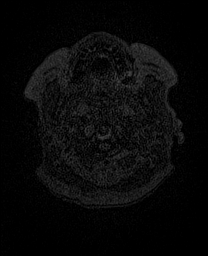
[im 18/160]
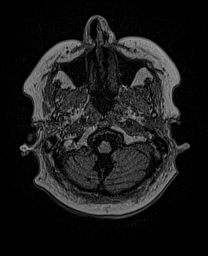
[im 36/160]
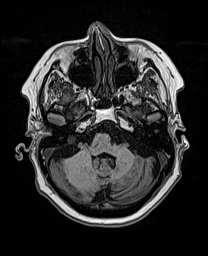
[im 54/160]
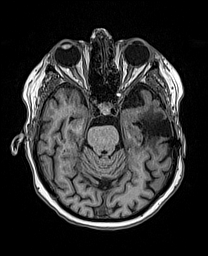
[im 71/160]
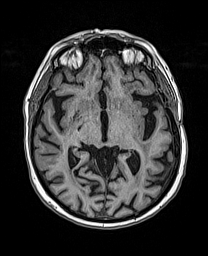
[im 89/160]
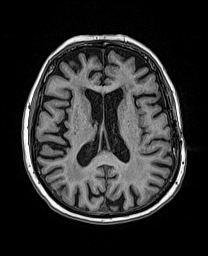
[im 107/160]
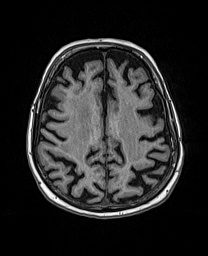
[im 124/160]
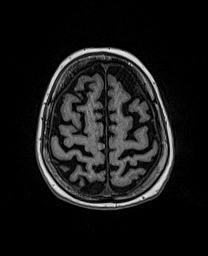
[im 142/160]
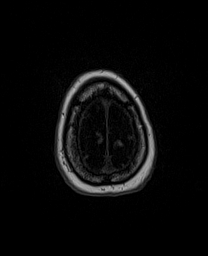
[im 160/160]
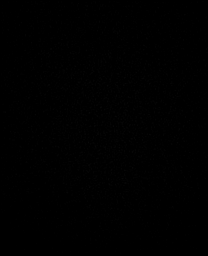

[Series 13: cor dwi_tracew · coronal · 5.0mm · 1.53mm/px · 3 of 54 slices shown]
[im 1/54]
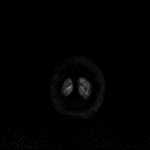
[im 27/54]
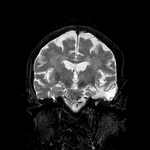
[im 54/54]
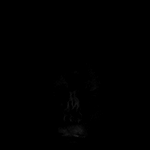

[Series 14: cor dwi_adc · coronal · 5.0mm · 1.53mm/px · 2 of 27 slices shown]
[im 1/27]
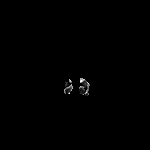
[im 27/27]
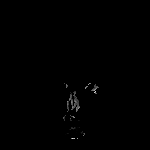

[Series 15: T2 · coronal · 5.0mm · 0.57mm/px · 2 of 32 slices shown (2 of 2)]
[im 1/32]
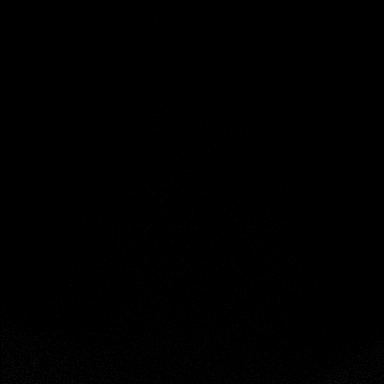
[im 32/32]
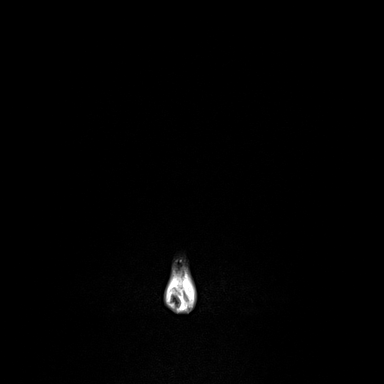

[Series 16: T1 post-contrast · coronal · 5.0mm · 0.43mm/px · 2 of 32 slices shown (1 of 3)]
[im 1/32]
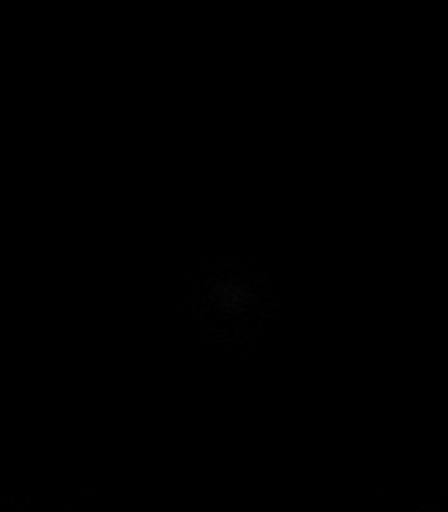
[im 32/32]
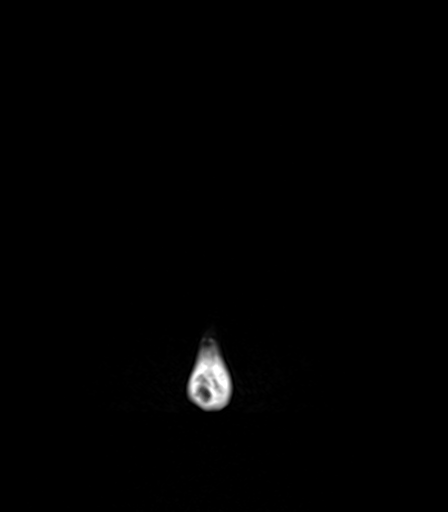

[Series 17: T1 post-contrast · axial · 1.0mm · 0.94mm/px · z∈[-52,+106]mm · 10 of 160 slices shown (2 of 3)]
[im 1/160]
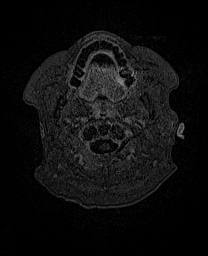
[im 18/160]
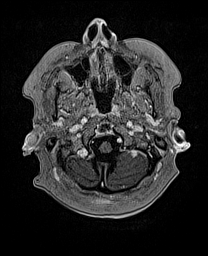
[im 36/160]
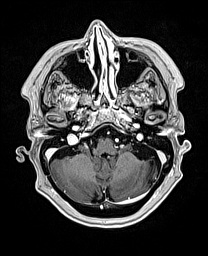
[im 54/160]
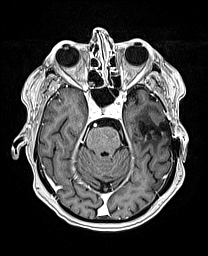
[im 71/160]
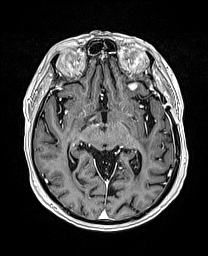
[im 89/160]
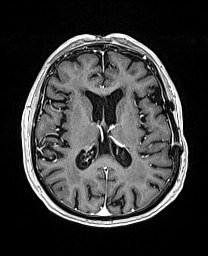
[im 107/160]
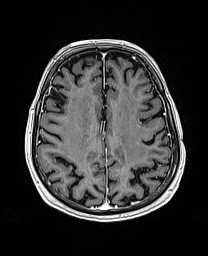
[im 124/160]
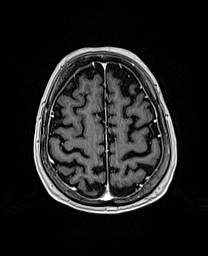
[im 142/160]
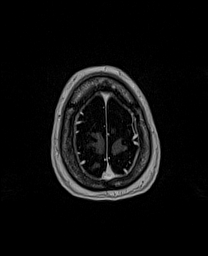
[im 160/160]
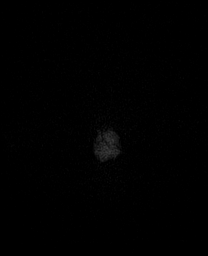

[Series 18: T1 post-contrast · sagittal · 5.0mm · 0.75mm/px · 1 of 24 slices shown (3 of 3)]
[im 1/24]
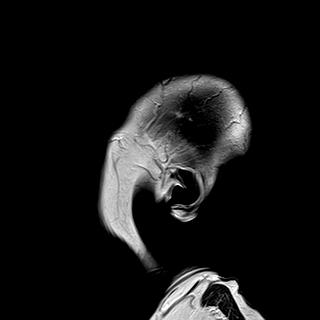

[48 of 48 positions shown; findings below may reference images not displayed]

FINDINGS: Brain: No acute infarction, hemorrhage, hydrocephalus or extra-axial
collection.

Postsurgical changes from left pterional craniotomy with surgical
cavity and the left temporal lobe, with linear focus of contrast
enhancement adjacent to the temporal horn of the right lateral
ventricle, unchanged from prior MRI.

Interval increase in size of the left a meningeal focus of contrast
enhancement in the orbital surface of the left frontal lobe, now
measuring 8 mm (4 mm on prior). There is also increase of the
surrounding T2 hyperintensity in the adjacent brain parenchyma.

There is also interval increase of the leptomeningeal focus of
contrast enhancement along the inferior surface of the left
cerebellar hemisphere, measuring now 6 mm (5 mm on prior). There is
also a new adjacent 5 mm focus of contrast enhancement. No other
focus of abnormal contrast enhancement identified.

Confluent T2 hyperintensity within the white matter of the cerebral
hemispheres suggestive of posttreatment changes, progressed since
prior MRI.

Vascular: Normal flow voids.

Skull and upper cervical spine: Postsurgical changes from left
craniotomy. Marrow signal characteristics are otherwise maintained.

Sinuses/Orbits: Negative.

Other: None.
IMPRESSION: Findings suggestive of disease progression with increase in size of
previously identified leptomeningeal enhancing lesions in the
orbital surface of the left frontal lobe and inferior surface of the
left cerebellar hemisphere with a new enhancing focus adjacent to
the previously seen cerebellar lesion.

## 2021-11-12 MED ORDER — GADOBUTROL 1 MMOL/ML IV SOLN
7.0000 mL | Freq: Once | INTRAVENOUS | Status: AC | PRN
  Administered 2021-11-12: 7 mL via INTRAVENOUS

## 2021-11-15 ENCOUNTER — Other Ambulatory Visit: Payer: Self-pay

## 2021-11-15 ENCOUNTER — Inpatient Hospital Stay: Payer: Medicare Other | Attending: Gynecologic Oncology | Admitting: Internal Medicine

## 2021-11-15 VITALS — BP 140/57 | HR 67 | Temp 97.9°F | Resp 18 | Wt 158.9 lb

## 2021-11-15 DIAGNOSIS — C7931 Secondary malignant neoplasm of brain: Secondary | ICD-10-CM | POA: Insufficient documentation

## 2021-11-15 DIAGNOSIS — I10 Essential (primary) hypertension: Secondary | ICD-10-CM | POA: Insufficient documentation

## 2021-11-15 DIAGNOSIS — E039 Hypothyroidism, unspecified: Secondary | ICD-10-CM | POA: Diagnosis not present

## 2021-11-15 DIAGNOSIS — Z7952 Long term (current) use of systemic steroids: Secondary | ICD-10-CM | POA: Insufficient documentation

## 2021-11-15 DIAGNOSIS — C7951 Secondary malignant neoplasm of bone: Secondary | ICD-10-CM | POA: Diagnosis not present

## 2021-11-15 DIAGNOSIS — C569 Malignant neoplasm of unspecified ovary: Secondary | ICD-10-CM | POA: Diagnosis not present

## 2021-11-15 DIAGNOSIS — C7949 Secondary malignant neoplasm of other parts of nervous system: Secondary | ICD-10-CM | POA: Diagnosis not present

## 2021-11-15 DIAGNOSIS — Z79899 Other long term (current) drug therapy: Secondary | ICD-10-CM | POA: Insufficient documentation

## 2021-11-15 NOTE — Progress Notes (Signed)
[facility] [place], [place] [address] [phone]   Interval Evaluation  Date of Service: 11/15/21 Patient Name: Connie Sharp Patient MRN: [id number] Patient DOB: 09-22-1941 Provider: Ventura Sellers, MD  Identifying Statement:  Connie Sharp is a 80 y.o. 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  She had abdominal paracentesis at the time   04/28/2017 - 06/10/2017 Chemotherapy   She had 3 cycles of neoadjuvant chemo with carboplatin and paclitaxel in [place]       07/22/2017 Surgery   She had exploratory laparotomy and interval cytoreductive surgery   08/26/2017 - 10/07/2017 Chemotherapy   She received 3 more cycles of postoperative chemotherapy with carboplatin and paclitaxel       02/01/2019 PET scan   Outside PET CT scan showed brain metastasis and cardio phrenic lymph node   02/18/2019 Surgery   She underwent neurosurgical resection of brain mass and postoperative radiation therapy.  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   06/02/2019 PET scan   Repeat outside PET CT scan showed cardiophrenic lymph node unchanged.   08/30/2019 Imaging   Outside MRI late September showed tumor in the roof of the left orbit.   09/06/2019 PET scan   October 2020 PET CT scan abnormalities on the left orbital tumor and stable cardiophrenic nodule.   10/04/2019 Surgery   She underwent radiosurgery for orbital tumor.   11/08/2019 Imaging   Outside  follow-up MRI showed reduce in size of the size of left orbital tumor.   02/07/2020 Imaging   She was noted to have enlarging brain metastasis and receive further radiation treatment.   02/22/2020 - 03/14/2020 Radiation Therapy   She received treatment to left cerebellar lesion, retroclival lesion   05/12/2020 Imaging   Outside MRI and CT scan of the chest, abdomen and pelvis showed disease stability.   08/10/2020 Imaging   Outside CT imaging was reported as stable.  Brain MRI was significant for new infratentorial compartment metastasis.   10/02/2020 -  Radiation Therapy   Date of treatment is unknown but she received further radiation therapy to at least 4 lesions.   10/12/2020 - 02/22/2021 Chemotherapy   She has received concurrent Zejula at 200 mg daily with pembrolizumab       01/10/2021 Imaging   MRI brain done elsewhere showed new leptomeningeal enhancement of the inferior anterior lateral left frontal lobe, new nodular enhancement of the left anterior cingulate gyrus, increased size of nodular leptomeningeal enhancement of bilateral trigeminal nerves, right superior cerebellar and anterior callosal enhancing lesions have increased in size and left vent trolled lateral cerebellar leptomeningeal enhancement is similar compared to before  CT imaging shows stability of cardiophrenic lymph node   01/17/2021 - 01/31/2021 Radiation Therapy   She received whole brain radiation therapy, 30 Gy in 10 fractions   01/31/2021 -  Radiation Therapy   She received whole brain radiation therapy, 30 Gy completed on 01/31/2021   02/21/2021 Tumor Marker   Patient's tumor was tested for the following markers: CA-125 Results of the tumor marker test revealed  58   02/26/2021 Imaging   Outside MRI spine showed leptomeningeal disease in the cervical and thoracic cord, bulky at T1 with significant epidural lesion in that location   03/12/2021 Cancer Staging   Staging form: Ovary, Fallopian Tube, and Primary  Peritoneal Carcinoma, AJCC 8th Edition - Clinical stage from 03/12/2021: Stage IV (rcT3, cN0, pM1) - Signed by Heath Lark, MD on 03/12/2021 Stage prefix: Recurrence    03/12/2021 Tumor Marker   Patient's tumor was tested for the following markers: CA-125 Results of the tumor marker test revealed 45.8     Interval History:  Connie Sharp presents today for follow up after recent MRI brain and spine.  Denies new or progressive deficits today.  Facial and lower leg numbness is reported as stable.  Connie Sharp does describe some uptick in short term memory impairment.  Continues to ambulate independently, takes care of ADLs.  Fatigue continues to be a problem, she naps occasionally.  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  She describes ~6 weeks history of numbness in her legs, left more than right.  It comes and goes, moves up as high as her hip, sometimes just in the foot itself.  She also describes clear decline in her balance, she is now often walking with a cane.  At home, going from room to room, she does not need the cane.  Otherwise denies headaches, seizures, double vision, cognitive impairment, dysuria, saddle numbness.  Plan was for spine radiation in [place], but she has moved to [place] to be closer to her family.  Medications: Current Outpatient Medications on File Prior to Visit  Medication Sig Dispense Refill   amoxicillin (AMOXIL) 500 MG tablet Take 1 tablet (500 mg total) by mouth 2 (two) times daily. 14 tablet 0   Cholecalciferol (VITAMIN D3) 50 MCG (2000 UT) TABS Take 1 tablet by mouth daily.     dexamethasone (DECADRON) 1 MG tablet Take 1 tablet (1 mg total) by mouth daily. 30 tablet 3   docusate sodium (COLACE) 100 MG capsule Take 100 mg by mouth 2 (two) times daily.     doxylamine, Sleep, (UNISOM) 25 MG tablet Take 25 mg by mouth at bedtime as needed.     fluticasone (VERAMYST) 27.5  MCG/SPRAY nasal spray Place 2 sprays into the nose daily.     levothyroxine (SYNTHROID) 50 MCG tablet Take 50 mcg by mouth daily before breakfast.     LORazepam (ATIVAN) 1 MG tablet TAKE 1 TABLET BY MOUTH AT BEDTIME. 30 tablet 1   Melatonin 10 MG TABS Take by mouth.     pantoprazole (PROTONIX) 40 MG tablet Take 40 mg by mouth at bedtime.     polyethylene glycol (MIRALAX / GLYCOLAX) 17 g packet Take 17 g by mouth daily.     propranolol (INNOPRAN XL) 120 MG 24 hr capsule Take 1 capsule (120 mg total) by mouth daily. 30 capsule 1   Sennosides (SENNA) 8.6 MG CAPS Take 2 capsules by mouth 2 (two) times daily.     timolol (TIMOPTIC-XR) 0.25 % ophthalmic gel-forming Place 1 drop into both eyes daily.     traZODone (DESYREL) 50 MG tablet Take 0.5-1 tablets (25-50 mg total) by mouth at bedtime. 30 tablet 0   No current facility-administered medications on file prior to visit.    Allergies:  Allergies  Allergen Reactions   Ambien [Zolpidem Tartrate] Other (See Comments)    Delirium   Cephalexin  Nausea And Vomiting, Hives and Rash   Cefaclor Other (See Comments) and Hives    unknown   Noroxin [Norfloxacin] Other (See Comments)    unknown   Scallops [Shellfish Allergy] Nausea And Vomiting and Swelling   Past Medical History:  Past Medical History:  Diagnosis Date   Anemia    Atrophic vaginitis 09/29/2012   Brain cancer ([place])    metastasis   Degenerative, intervertebral disc, cervical    Diverticulosis    Fallopian tube cancer, carcinoma ([place]) 11/02/2018   GERD (gastroesophageal reflux disease)    Glaucoma    left eye   Hx of migraines    Hyperlipidemia    Hypertension    Injury of right rotator cuff    Kidney stones    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   Migraine    Osteoporosis 10/18/2011   Ovarian cancer ([place])    Thyroid disease    hypothyroid   Past Surgical History:  Past Surgical History:  Procedure Laterality Date   ABDOMINAL HYSTERECTOMY     1987   BILATERAL  SALPINGOOPHORECTOMY     07-22-2017   CHOLECYSTECTOMY  08/02/2017   COLONOSCOPY  08/2015   EYE SURGERY Left 2004   Left eye wrikleremoval   NOSE SURGERY  09/05/2014   Fractured Nose    Plastic reconstructive      Scar tissue left breast at age 66   TONSILLECTOMY AND ADENOIDECTOMY     Social History:  Social History   Socioeconomic History   Marital status: Married    Spouse name: Not on file   Number of children: Not on file   Years of education: Not on file   Highest education level: Not on file  Occupational History   Not on file  Tobacco Use   Smoking status: Former    Packs/day: 0.25    Years: 4.00    Pack years: 1.00    Types: Cigarettes    Quit date: 03/07/1965    Years since quitting: 56.7   Smokeless tobacco: Never  Vaping Use   Vaping Use: Never used  Substance and Sexual Activity   Alcohol use: Yes    Comment: occasionally   Drug use: Never   Sexual activity: Not Currently  Other Topics Concern   Not on file  Social History Narrative   Not on file   Social Determinants of Health   Financial Resource Strain: Not on file  Food Insecurity: Not on file  Transportation Needs: Not on file  Physical Activity: Not on file  Stress: Not on file  Social Connections: Not on file  Intimate Partner Violence: Not At Risk   Fear of Current or Ex-Partner: No   Emotionally Abused: No   Physically Abused: No   Sexually Abused: No   Family History:  Family History  Problem Relation Age of Onset   Hypertension Mother    Congestive Heart Failure Mother    Hypercholesterolemia Mother    Macular degeneration Father    Non-Hodgkin's lymphoma Brother    Colon cancer Neg Hx    Ovarian cancer Neg Hx    Breast cancer Neg Hx    Endometrial cancer Neg Hx    Pancreatic cancer Neg Hx    Prostate cancer Neg Hx     Review of Systems: Constitutional: Doesn't report fevers, chills or abnormal weight loss Eyes: Doesn't report blurriness of vision Ears, nose, mouth,  throat, and face: Doesn't report sore throat Respiratory: Doesn't report cough, dyspnea or wheezes Cardiovascular: Doesn't report  palpitation, chest discomfort  Gastrointestinal:  Doesn't report nausea, constipation, diarrhea GU: Doesn't report incontinence Skin: Doesn't report skin rashes Neurological: Per HPI Musculoskeletal: Doesn't report joint pain Behavioral/Psych: Doesn't report anxiety  Physical Exam: Vitals:   11/15/21 1013  BP: (!) 140/57  Pulse: 67  Resp: 18  Temp: 97.9 F (36.6 C)  SpO2: 100%   KPS: 70. General: Alert, cooperative, pleasant, in no acute distress Head: Normal EENT: No conjunctival injection or scleral icterus.  Lungs: Resp effort normal Cardiac: Regular rate Abdomen: Non-distended abdomen Skin: No rashes cyanosis or petechiae. Extremities: No clubbing or edema  Neurologic Exam: Mental Status: Awake, alert, attentive to examiner. Oriented to self and environment. Language is fluent with intact comprehension.  Cranial Nerves: Visual acuity is grossly normal. Visual fields are full. Extra-ocular movements intact. No ptosis. Face is symmetric Motor: Tone and bulk are normal. Power is full in both arms and legs. Reflexes are decreased, no pathologic reflexes present.  Sensory: Impaired in stocking pattern Gait: Wide based gait  Labs: I have reviewed the data as listed    Component Value Date/Time   NA 142 10/12/2021 1322   K 3.4 (L) 10/12/2021 1322   CL 106 10/12/2021 1322   CO2 28 10/12/2021 1322   GLUCOSE 87 10/12/2021 1322   BUN 11 10/12/2021 1322   CREATININE 0.85 10/12/2021 1322   CALCIUM 8.5 (L) 10/12/2021 1322   PROT 6.4 (L) 10/12/2021 1322   ALBUMIN 3.7 10/12/2021 1322   AST 13 (L) 10/12/2021 1322   ALT 12 10/12/2021 1322   ALKPHOS 53 10/12/2021 1322   BILITOT 1.1 10/12/2021 1322   GFRNONAA >60 10/12/2021 1322   Lab Results  Component Value Date   WBC 9.0 10/12/2021   NEUTROABS 7.7 10/12/2021   HGB 13.9 10/12/2021   HCT 41.5  10/12/2021   MCV 95.6 10/12/2021   PLT 198 10/12/2021   Imaging:  [facility] Clinician Interpretation: I have personally reviewed the CNS images as listed.  My interpretation, in the context of the patient's clinical presentation, is treatment effect vs true progression  MR BRAIN W WO CONTRAST  Result Date: 11/12/2021 CLINICAL DATA:  Brain/CNS neoplasm, assess for treatment response. Leptomeningeal metastasis. EXAM: MRI HEAD WITHOUT AND WITH CONTRAST TECHNIQUE: Multiplanar, multiecho pulse sequences of the brain and surrounding structures were obtained without and with intravenous contrast. CONTRAST:  51m GADAVIST GADOBUTROL 1 MMOL/ML IV SOLN COMPARISON:  MRI of the brain August 14, 2021. FINDINGS: Brain: No acute infarction, hemorrhage, hydrocephalus or extra-axial collection. Postsurgical changes from left pterional craniotomy with surgical cavity and the left temporal lobe, with linear focus of contrast enhancement adjacent to the temporal horn of the right lateral ventricle, unchanged from prior MRI. Interval increase in size of the left a meningeal focus of contrast enhancement in the orbital surface of the left frontal lobe, now measuring 8 mm (4 mm on prior). There is also increase of the surrounding T2 hyperintensity in the adjacent brain parenchyma. There is also interval increase of the leptomeningeal focus of contrast enhancement along the inferior surface of the left cerebellar hemisphere, measuring now 6 mm (5 mm on prior). There is also a new adjacent 5 mm focus of contrast enhancement. No other focus of abnormal contrast enhancement identified. Confluent T2 hyperintensity within the white matter of the cerebral hemispheres suggestive of posttreatment changes, progressed since prior MRI. Vascular: Normal flow voids. Skull and upper cervical spine: Postsurgical changes from left craniotomy. Marrow signal characteristics are otherwise maintained. Sinuses/Orbits: Negative. Other: None. IMPRESSION:  Findings suggestive of disease progression with increase in size of previously identified leptomeningeal enhancing lesions in the orbital surface of the left frontal lobe and inferior surface of the left cerebellar hemisphere with a new enhancing focus adjacent to the previously seen cerebellar lesion. Electronically Signed   By: Pedro Earls M.D.   On: 11/12/2021 14:04   MR TOTAL SPINE METS SCREENING  Result Date: 11/12/2021 CLINICAL DATA:  Metastatic disease evaluation leptomeningeal disease. EXAM: MRI TOTAL SPINE WITHOUT AND WITH CONTRAST TECHNIQUE: Multisequence MR imaging of the spine from the cervical spine to the sacrum was performed prior to and following IV contrast administration for evaluation of spinal metastatic disease. CONTRAST:  9m GADAVIST GADOBUTROL 1 MMOL/ML IV SOLN COMPARISON:  05/15/2021 FINDINGS: MRI CERVICAL SPINE FINDINGS Alignment: Grade 1 anterolisthesis at C2-3. Vertebrae: No fracture, evidence of discitis, or bone lesion. Cord: Normal signal and morphology. Disc levels: Multilevel degenerative disc disease, greatest at C4-6 without high-grade spinal canal stenosis. MRI THORACIC SPINE FINDINGS Alignment:  Physiologic. Vertebrae: T6 hemangioma.  Otherwise unremarkable. Cord: There is a leptomeningeal focus of contrast enhancement on the dorsal aspect of the spinal cord at the T1 level (series 29, image 9 and series 37 image 8). The craniocaudal length of the lesion has decreased. No other abnormal contrast enhancement. Paraspinal and other soft tissues: Unremarkable Disc levels: No spinal canal stenosis MRI LUMBAR SPINE FINDINGS Segmentation:  Standard Alignment:  Grade 1 anterolisthesis at L4-5 Vertebrae:  No fracture, evidence of discitis, or bone lesion. Conus medullaris: Extends to the L1 level and appears normal. Paraspinal and other soft tissues: Negative. Disc levels: No spinal canal stenosis. No nerve root impingement. Lower lumbar facet arthrosis is  moderate-to-severe, particularly at L4-5. IMPRESSION: 1. Small leptomeningeal focus of contrast enhancement on the dorsal aspect of the spinal cord at the T1 level, decreased in craniocaudal extent since 05/15/2021. 2. No other leptomeningeal metastatic disease. 3. Mild cervical degenerative disc disease without high-grade spinal canal stenosis. 4. Lower lumbar facet arthrosis, which may be a source of local low back pain. Electronically Signed   By: KUlyses JarredM.D.   On: 11/12/2021 15:00     Assessment/Plan Leptomeningeal metastases ([facility] - Plan: MR BRAIN W WO CONTRAST  Kyndel ATimia Casselmanis clinically stable today.  MRI of the spine is stable, but brain study demonstrates two foci of progression.  Etiology is either refractory/recurrent treated disease, or delayed radiation treatment effect.    We discussed options moving forward, including salvage radiosurgery, further imaging surveillance, hospice referral.  She would prefer continuing MRI surveillance; we will recommend repeat study in 6-8 weeks for further characterization.  No steroids at this time.  She is agreeable to this plan.    We appreciate the opportunity to participate in the care of [place]    All questions were answered. The patient knows to call the clinic with any problems, questions or concerns. No barriers to learning were detected.  The total time spent in the encounter was 40 minutes and more than 50% was on counseling and review of test results   ZVentura Sellers MD Medical Director of Neuro-Oncology [facility] [place]12/15/22 4:11 PM

## 2021-11-16 ENCOUNTER — Telehealth: Payer: Self-pay

## 2021-11-16 ENCOUNTER — Telehealth: Payer: Self-pay | Admitting: Internal Medicine

## 2021-11-16 NOTE — Telephone Encounter (Signed)
Pt left a message stating "Dr. Mickeal Skinner recommended an eye doctor" but she does not recall who it was. She would like a call back with this information.  I've called the pt back to acknowledge her call. I have advised we will call her back with the recommendation on Monday 11/19/21. Pt expressed understanding of this information and thanked [place] for the call.

## 2021-11-16 NOTE — Telephone Encounter (Signed)
Scheduled per 12/15 los, pt has been called and confirmed appt

## 2021-11-16 NOTE — Telephone Encounter (Signed)
I have contacted the pt and advised Dr. Clent Jacks who can be reached at [phone]. Pt expressed understanding of this information.

## 2021-11-19 ENCOUNTER — Inpatient Hospital Stay: Payer: Medicare Other

## 2021-11-19 ENCOUNTER — Other Ambulatory Visit: Payer: Self-pay | Admitting: Hematology and Oncology

## 2021-11-19 ENCOUNTER — Telehealth: Payer: Self-pay

## 2021-11-19 ENCOUNTER — Other Ambulatory Visit (HOSPITAL_COMMUNITY): Payer: Self-pay

## 2021-11-19 MED ORDER — AMOXICILLIN 500 MG PO CAPS
500.0000 mg | ORAL_CAPSULE | Freq: Two times a day (BID) | ORAL | 0 refills | Status: DC
  Filled 2021-11-19: qty 14, 7d supply, fill #0

## 2021-11-19 NOTE — Telephone Encounter (Signed)
Called with below message. She is complaining of urinary urgency and a hard time starting to void, started on Friday. Denies fever and burning with urination. She took Amoxicillin earlier in the month and ir helped.

## 2021-11-19 NOTE — Telephone Encounter (Signed)
-----   Message from Heath Lark, MD sent at 11/19/2021 10:25 AM EST ----- Regarding: amoxicliin refill [facility] sent electronic request Can you call her? I just saw her husband today and he said she was fine

## 2021-11-19 NOTE — Telephone Encounter (Signed)
Called and given below message. She verbalized understanding. 

## 2021-11-19 NOTE — Telephone Encounter (Signed)
I explained to her husband, I am concerned about antibiotics resistance If she gets another UTI next month, she needs to get repeat UA and Urine culture I refilled her antibiotics

## 2021-11-27 ENCOUNTER — Other Ambulatory Visit: Payer: Self-pay | Admitting: *Deleted

## 2021-11-27 ENCOUNTER — Other Ambulatory Visit: Payer: Self-pay | Admitting: Internal Medicine

## 2021-11-27 ENCOUNTER — Telehealth: Payer: Self-pay | Admitting: *Deleted

## 2021-11-27 DIAGNOSIS — R300 Dysuria: Secondary | ICD-10-CM

## 2021-11-27 NOTE — Telephone Encounter (Signed)
Pt called complaining of reoccurring UTI symptoms. Kaitlyn,PA in [facility] will see pt. As well as lab appt for UA. Pt called and made aware of appointments. Pt verbalized understanding.

## 2021-11-28 ENCOUNTER — Other Ambulatory Visit: Payer: Self-pay

## 2021-11-28 ENCOUNTER — Inpatient Hospital Stay (HOSPITAL_BASED_OUTPATIENT_CLINIC_OR_DEPARTMENT_OTHER): Payer: Medicare Other | Admitting: Physician Assistant

## 2021-11-28 ENCOUNTER — Inpatient Hospital Stay: Payer: Medicare Other

## 2021-11-28 VITALS — BP 137/75 | HR 70 | Temp 98.2°F | Resp 16 | Wt 161.2 lb

## 2021-11-28 DIAGNOSIS — C569 Malignant neoplasm of unspecified ovary: Secondary | ICD-10-CM | POA: Diagnosis not present

## 2021-11-28 DIAGNOSIS — E876 Hypokalemia: Secondary | ICD-10-CM | POA: Diagnosis not present

## 2021-11-28 DIAGNOSIS — R300 Dysuria: Secondary | ICD-10-CM

## 2021-11-28 LAB — COMPREHENSIVE METABOLIC PANEL
ALT: 12 U/L (ref 0–44)
AST: 10 U/L — ABNORMAL LOW (ref 15–41)
Albumin: 3.8 g/dL (ref 3.5–5.0)
Alkaline Phosphatase: 47 U/L (ref 38–126)
Anion gap: 6 (ref 5–15)
BUN: 17 mg/dL (ref 8–23)
CO2: 31 mmol/L (ref 22–32)
Calcium: 8.9 mg/dL (ref 8.9–10.3)
Chloride: 104 mmol/L (ref 98–111)
Creatinine, Ser: 0.77 mg/dL (ref 0.44–1.00)
GFR, Estimated: >60 mL/min (ref >60)
Glucose, Bld: 73 mg/dL (ref 70–99)
Potassium: 3 mmol/L — ABNORMAL LOW (ref 3.5–5.1)
Sodium: 141 mmol/L (ref 135–145)
Total Bilirubin: 1.1 mg/dL (ref 0.3–1.2)
Total Protein: 6.3 g/dL — ABNORMAL LOW (ref 6.5–8.1)

## 2021-11-28 LAB — CBC WITH DIFFERENTIAL/PLATELET
Abs Immature Granulocytes: 0.08 10*3/uL — ABNORMAL HIGH (ref 0.00–0.07)
Basophils Absolute: 0 10*3/uL (ref 0.0–0.1)
Basophils Relative: 0 %
Eosinophils Absolute: 0.1 10*3/uL (ref 0.0–0.5)
Eosinophils Relative: 1 %
HCT: 40.3 % (ref 36.0–46.0)
Hemoglobin: 13.5 g/dL (ref 12.0–15.0)
Immature Granulocytes: 1 %
Lymphocytes Relative: 12 %
Lymphs Abs: 1 10*3/uL (ref 0.7–4.0)
MCH: 32 pg (ref 26.0–34.0)
MCHC: 33.5 g/dL (ref 30.0–36.0)
MCV: 95.5 fL (ref 80.0–100.0)
Monocytes Absolute: 0.7 10*3/uL (ref 0.1–1.0)
Monocytes Relative: 8 %
Neutro Abs: 6.9 10*3/uL (ref 1.7–7.7)
Neutrophils Relative %: 78 %
Platelets: 209 10*3/uL (ref 150–400)
RBC: 4.22 MIL/uL (ref 3.87–5.11)
RDW: 13.7 % (ref 11.5–15.5)
WBC: 8.7 10*3/uL (ref 4.0–10.5)
nRBC: 0 % (ref 0.0–0.2)

## 2021-11-28 LAB — URINALYSIS, COMPLETE (UACMP) WITH MICROSCOPIC
Bilirubin Urine: NEGATIVE
Glucose, UA: NEGATIVE mg/dL
Hgb urine dipstick: NEGATIVE
Ketones, ur: NEGATIVE mg/dL
Nitrite: NEGATIVE
Protein, ur: NEGATIVE mg/dL
Specific Gravity, Urine: 1.012 (ref 1.005–1.030)
pH: 6 (ref 5.0–8.0)

## 2021-11-28 MED ORDER — PHENAZOPYRIDINE HCL 200 MG PO TABS: 200.0000 mg | ORAL_TABLET | Freq: Three times a day (TID) | ORAL | 0 refills | Status: AC | PRN

## 2021-11-28 MED ORDER — POTASSIUM CHLORIDE CRYS ER 20 MEQ PO TBCR: 20.0000 meq | EXTENDED_RELEASE_TABLET | Freq: Two times a day (BID) | ORAL | 0 refills | Status: DC

## 2021-11-28 NOTE — Progress Notes (Signed)
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  She had abdominal paracentesis at the time   04/28/2017 - 06/10/2017 Chemotherapy   She had 3 cycles of neoadjuvant chemo with carboplatin and paclitaxel in [place]       07/22/2017 Surgery   She had exploratory laparotomy and interval cytoreductive surgery   08/26/2017 - 10/07/2017 Chemotherapy   She received 3 more cycles of postoperative chemotherapy with carboplatin and paclitaxel       02/01/2019 PET scan   Outside PET CT scan showed brain metastasis and cardio phrenic lymph node   02/18/2019 Surgery   She underwent neurosurgical resection of brain mass and postoperative radiation therapy.  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   06/02/2019 PET scan   Repeat outside PET CT scan showed cardiophrenic lymph node unchanged.   08/30/2019 Imaging   Outside MRI late September showed tumor in the roof of the left orbit.   09/06/2019 PET scan   October 2020 PET CT scan abnormalities on the left orbital tumor and stable cardiophrenic nodule.   10/04/2019 Surgery   She underwent radiosurgery for orbital tumor.   11/08/2019 Imaging   Outside follow-up MRI showed reduce in size of the size of left orbital tumor.   02/07/2020 Imaging   She was noted to have  enlarging brain metastasis and receive further radiation treatment.   02/22/2020 - 03/14/2020 Radiation Therapy   She received treatment to left cerebellar lesion, retroclival lesion   05/12/2020 Imaging   Outside MRI and CT scan of the chest, abdomen and pelvis showed disease stability.   08/10/2020 Imaging   Outside CT imaging was reported as stable.  Brain MRI was significant for new infratentorial compartment metastasis.   10/02/2020 -  Radiation Therapy   Date of treatment is unknown but she received further radiation therapy to at least 4 lesions.   10/12/2020 - 02/22/2021 Chemotherapy   She has received concurrent Zejula at 200 mg daily with pembrolizumab       01/10/2021 Imaging   MRI brain done elsewhere showed new leptomeningeal enhancement of the inferior anterior lateral left frontal lobe, new nodular enhancement of the left anterior cingulate gyrus, increased size of nodular leptomeningeal enhancement of bilateral trigeminal nerves, right superior cerebellar and anterior callosal enhancing lesions have increased in size and left vent trolled lateral cerebellar leptomeningeal enhancement is similar compared to before  CT imaging shows stability of cardiophrenic lymph node   01/17/2021 - 01/31/2021 Radiation Therapy   She received whole brain radiation therapy, 30 Gy in 10 fractions   01/31/2021 -  Radiation Therapy   She received whole brain radiation therapy, 30 Gy completed on 01/31/2021   02/21/2021 Tumor Marker   Patient's tumor was tested for the following markers: CA-125 Results of the tumor marker test revealed 58   02/26/2021 Imaging   Outside MRI  spine showed leptomeningeal disease in the cervical and thoracic cord, bulky at T1 with significant epidural lesion in that location   03/12/2021 Cancer Staging   Staging form: Ovary, Fallopian Tube, and Primary Peritoneal Carcinoma, AJCC 8th Edition - Clinical stage from 03/12/2021: Stage IV (rcT3, cN0, pM1) - Signed by Heath Lark,  MD on 03/12/2021 Stage prefix: Recurrence    03/12/2021 Tumor Marker   Patient's tumor was tested for the following markers: CA-125 Results of the tumor marker test revealed 45.8     Current Therapy: supportive care   Interval history- Connie Sharp is a 80 yo female with oncologic history as stated above presenting to [facility] today with chief complaint of dysuria x 2 days. Husband is at the bedside and contributes to history.  Patient states she started having urinary frequency and dysuria 11/19/2021.  She informed her oncologist and a prescription was called in for amoxicillin.  Patient finished the antibiotic yesterday.  She unfortunately missed 1 day of the antibiotic because she had to leave her house when they lost power and she forgot to take it with her.  She states the day she went without the antibiotic she had urinary frequency and was going every 10 to 15 minutes.  She states that has since resolved and she is asymptomatic.  She is worried about symptoms returning and unsure if she needs a longer course of antibiotics.  She denies any gross hematuria, back pain, abdominal pain, vaginal discharge, nausea, vomiting, fever, chills, rash.  She admits to having a good appetite and p.o. intake.     ROS  All other systems are reviewed and are negative for acute change except as noted in the HPI.   Allergies  Allergen Reactions   Ambien [Zolpidem Tartrate] Other (See Comments)    Delirium   Cephalexin Nausea And Vomiting, Hives and Rash   Cefaclor Other (See Comments) and Hives    unknown   Noroxin [Norfloxacin] Other (See Comments)    unknown   Scallops [Shellfish Allergy] Nausea And Vomiting and Swelling     Past Medical History:  Diagnosis Date   Anemia    Atrophic vaginitis 09/29/2012   Brain cancer ([place])    metastasis   Degenerative, intervertebral disc, cervical    Diverticulosis    Fallopian tube cancer, carcinoma ([place]) 11/02/2018   GERD (gastroesophageal reflux  disease)    Glaucoma    left eye   Hx of migraines    Hyperlipidemia    Hypertension    Injury of right rotator cuff    Kidney stones    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   Migraine    Osteoporosis 10/18/2011   Ovarian cancer ([place])    Thyroid disease    hypothyroid     Past Surgical History:  Procedure Laterality Date   ABDOMINAL HYSTERECTOMY     1987   BILATERAL SALPINGOOPHORECTOMY     07-22-2017   CHOLECYSTECTOMY  08/02/2017   COLONOSCOPY  08/2015   EYE SURGERY Left 2004   Left eye wrikleremoval   NOSE SURGERY  09/05/2014   Fractured Nose    Plastic reconstructive      Scar tissue left breast at age 10   TONSILLECTOMY AND ADENOIDECTOMY      Social History   Socioeconomic History   Marital status: Married    Spouse name: Not on file   Number of children: Not on file   Years of education: Not on file   Highest education level: Not on file  Occupational History   Not on file  Tobacco Use   Smoking status: Former    Packs/day: 0.25    Years: 4.00    Pack years: 1.00    Types: Cigarettes    Quit date: 03/07/1965    Years since quitting: 56.7   Smokeless tobacco: Never  Vaping Use   Vaping Use: Never used  Substance and Sexual Activity   Alcohol use: Yes    Comment: occasionally   Drug use: Never   Sexual activity: Not Currently  Other Topics Concern   Not on file  Social History Narrative   Not on file   Social Determinants of Health   Financial Resource Strain: Not on file  Food Insecurity: Not on file  Transportation Needs: Not on file  Physical Activity: Not on file  Stress: Not on file  Social Connections: Not on file  Intimate Partner Violence: Not At Risk   Fear of Current or Ex-Partner: No   Emotionally Abused: No   Physically Abused: No   Sexually Abused: No    Family History  Problem Relation Age of Onset   Hypertension Mother    Congestive Heart Failure Mother    Hypercholesterolemia Mother    Macular degeneration Father     Non-Hodgkin's lymphoma Brother    Colon cancer Neg Hx    Ovarian cancer Neg Hx    Breast cancer Neg Hx    Endometrial cancer Neg Hx    Pancreatic cancer Neg Hx    Prostate cancer Neg Hx      Current Outpatient Medications:    phenazopyridine (PYRIDIUM) 200 MG tablet, Take 1 tablet (200 mg total) by mouth 3 (three) times daily as needed for up to 2 days for pain., Disp: 6 tablet, Rfl: 0   potassium chloride SA (KLOR-CON M) 20 MEQ tablet, Take 1 tablet (20 mEq total) by mouth 2 (two) times daily for 7 days., Disp: 14 tablet, Rfl: 0   amoxicillin (AMOXIL) 500 MG capsule, Take 1 capsule by mouth 2 times daily., Disp: 14 capsule, Rfl: 0   amoxicillin (AMOXIL) 500 MG tablet, Take 1 tablet (500 mg total) by mouth 2 (two) times daily., Disp: 14 tablet, Rfl: 0   Cholecalciferol (VITAMIN D3) 50 MCG (2000 UT) TABS, Take 1 tablet by mouth daily., Disp: , Rfl:    dexamethasone (DECADRON) 1 MG tablet, Take 1 tablet (1 mg total) by mouth daily., Disp: 30 tablet, Rfl: 3   docusate sodium (COLACE) 100 MG capsule, Take 100 mg by mouth 2 (two) times daily., Disp: , Rfl:    doxylamine, Sleep, (UNISOM) 25 MG tablet, Take 25 mg by mouth at bedtime as needed., Disp: , Rfl:    fluticasone (VERAMYST) 27.5 MCG/SPRAY nasal spray, Place 2 sprays into the nose daily., Disp: , Rfl:    levothyroxine (SYNTHROID) 50 MCG tablet, Take 50 mcg by mouth daily before breakfast., Disp: , Rfl:    LORazepam (ATIVAN) 1 MG tablet, TAKE 1 TABLET BY MOUTH AT BEDTIME., Disp: 30 tablet, Rfl: 1   Melatonin 10 MG TABS, Take by mouth., Disp: , Rfl:    pantoprazole (PROTONIX) 40 MG tablet, Take 40 mg by mouth at bedtime., Disp: , Rfl:    polyethylene glycol (MIRALAX / GLYCOLAX) 17 g packet, Take 17 g by mouth daily., Disp: , Rfl:    propranolol (INNOPRAN XL) 120 MG 24 hr capsule, Take 1 capsule (120 mg total) by mouth daily., Disp: 30 capsule, Rfl: 1   Sennosides (SENNA) 8.6 MG CAPS,  Take 2 capsules by mouth 2 (two) times daily., Disp: ,  Rfl:    timolol (TIMOPTIC-XR) 0.25 % ophthalmic gel-forming, Place 1 drop into both eyes daily., Disp: , Rfl:    traZODone (DESYREL) 50 MG tablet, Take 0.5-1 tablets (25-50 mg total) by mouth at bedtime., Disp: 30 tablet, Rfl: 0  PHYSICAL EXAM: ECOG FS:1 - Symptomatic but completely ambulatory    Vitals:   11/28/21 1428  BP: 137/75  Pulse: 70  Resp: 16  Temp: 98.2 F (36.8 C)  TempSrc: Oral  SpO2: 97%  Weight: 161 lb 4 oz (73.1 kg)   Physical Exam Vitals and nursing note reviewed.  Constitutional:      Appearance: She is well-developed. She is not ill-appearing or toxic-appearing.  HENT:     Head: Normocephalic and atraumatic.     Right Ear: External ear normal.     Left Ear: External ear normal.     Nose: Nose normal.  Eyes:     General: No scleral icterus.       Right eye: No discharge.        Left eye: No discharge.     Conjunctiva/sclera: Conjunctivae normal.  Neck:     Vascular: No JVD.  Cardiovascular:     Rate and Rhythm: Normal rate and regular rhythm.     Pulses: Normal pulses.     Heart sounds: Normal heart sounds.  Pulmonary:     Effort: Pulmonary effort is normal.     Breath sounds: Normal breath sounds.  Abdominal:     General: There is no distension.     Palpations: Abdomen is soft. There is no mass.     Tenderness: There is no abdominal tenderness. There is no right CVA tenderness, left CVA tenderness, guarding or rebound.     Hernia: No hernia is present.  Musculoskeletal:        General: Normal range of motion.     Cervical back: Normal range of motion.  Skin:    General: Skin is warm and dry.  Neurological:     Mental Status: She is oriented to person, place, and time.     GCS: GCS eye subscore is 4. GCS verbal subscore is 5. GCS motor subscore is 6.     Comments: Fluent speech, no facial droop.  Psychiatric:        Behavior: Behavior normal.       LABORATORY DATA: I have reviewed the data as listed CBC Latest Ref Rng & Units  11/28/2021 10/12/2021 05/22/2021  WBC 4.0 - 10.5 K/uL 8.7 9.0 8.2  Hemoglobin 12.0 - 15.0 g/dL 13.5 13.9 12.3  Hematocrit 36.0 - 46.0 % 40.3 41.5 37.2  Platelets 150 - 400 K/uL 209 198 201     CMP Latest Ref Rng & Units 11/28/2021 10/12/2021 05/22/2021  Glucose 70 - 99 mg/dL 73 87 107(H)  BUN 8 - 23 mg/dL _0 Creatinine 0.44 - 1.00 mg/dL 0.77 0.85 0.87  Sodium 135 - 145 mmol/L 141 142 140  Potassium 3.5 - 5.1 mmol/L 3.0(L) 3.4(L) 3.5  Chloride 98 - 111 mmol/L 104 106 107  CO2 22 - 32 mmol/L _1 Calcium 8.9 - 10.3 mg/dL 8.9 8.5(L) 8.7(L)  Total Protein 6.5 - 8.1 g/dL 6.3(L) 6.4(L) 6.2(L)  Total Bilirubin 0.3 - 1.2 mg/dL 1.1 1.1 1.4(H)  Alkaline Phos 38 - 126 U/L 47 53 39  AST 15 - 41 U/L 10(L) 13(L) 15  ALT 0 - 44 U/L 12 12  14       RADIOGRAPHIC STUDIES: I have personally reviewed the radiological images as listed and agreed with the findings in the report. No images are attached to the encounter. MR BRAIN W WO CONTRAST  Result Date: 11/12/2021 CLINICAL DATA:  Brain/CNS neoplasm, assess for treatment response. Leptomeningeal metastasis. EXAM: MRI HEAD WITHOUT AND WITH CONTRAST TECHNIQUE: Multiplanar, multiecho pulse sequences of the brain and surrounding structures were obtained without and with intravenous contrast. CONTRAST:  72m GADAVIST GADOBUTROL 1 MMOL/ML IV SOLN COMPARISON:  MRI of the brain August 14, 2021. FINDINGS: Brain: No acute infarction, hemorrhage, hydrocephalus or extra-axial collection. Postsurgical changes from left pterional craniotomy with surgical cavity and the left temporal lobe, with linear focus of contrast enhancement adjacent to the temporal horn of the right lateral ventricle, unchanged from prior MRI. Interval increase in size of the left a meningeal focus of contrast enhancement in the orbital surface of the left frontal lobe, now measuring 8 mm (4 mm on prior). There is also increase of the surrounding T2 hyperintensity in the adjacent  brain parenchyma. There is also interval increase of the leptomeningeal focus of contrast enhancement along the inferior surface of the left cerebellar hemisphere, measuring now 6 mm (5 mm on prior). There is also a new adjacent 5 mm focus of contrast enhancement. No other focus of abnormal contrast enhancement identified. Confluent T2 hyperintensity within the white matter of the cerebral hemispheres suggestive of posttreatment changes, progressed since prior MRI. Vascular: Normal flow voids. Skull and upper cervical spine: Postsurgical changes from left craniotomy. Marrow signal characteristics are otherwise maintained. Sinuses/Orbits: Negative. Other: None. IMPRESSION: Findings suggestive of disease progression with increase in size of previously identified leptomeningeal enhancing lesions in the orbital surface of the left frontal lobe and inferior surface of the left cerebellar hemisphere with a new enhancing focus adjacent to the previously seen cerebellar lesion. Electronically Signed   By: KPedro EarlsM.D.   On: 11/12/2021 14:04   MR TOTAL SPINE METS SCREENING  Result Date: 11/12/2021 CLINICAL DATA:  Metastatic disease evaluation leptomeningeal disease. EXAM: MRI TOTAL SPINE WITHOUT AND WITH CONTRAST TECHNIQUE: Multisequence MR imaging of the spine from the cervical spine to the sacrum was performed prior to and following IV contrast administration for evaluation of spinal metastatic disease. CONTRAST:  768mGADAVIST GADOBUTROL 1 MMOL/ML IV SOLN COMPARISON:  05/15/2021 FINDINGS: MRI CERVICAL SPINE FINDINGS Alignment: Grade 1 anterolisthesis at C2-3. Vertebrae: No fracture, evidence of discitis, or bone lesion. Cord: Normal signal and morphology. Disc levels: Multilevel degenerative disc disease, greatest at C4-6 without high-grade spinal canal stenosis. MRI THORACIC SPINE FINDINGS Alignment:  Physiologic. Vertebrae: T6 hemangioma.  Otherwise unremarkable. Cord: There is a leptomeningeal  focus of contrast enhancement on the dorsal aspect of the spinal cord at the T1 level (series 29, image 9 and series 37 image 8). The craniocaudal length of the lesion has decreased. No other abnormal contrast enhancement. Paraspinal and other soft tissues: Unremarkable Disc levels: No spinal canal stenosis MRI LUMBAR SPINE FINDINGS Segmentation:  Standard Alignment:  Grade 1 anterolisthesis at L4-5 Vertebrae:  No fracture, evidence of discitis, or bone lesion. Conus medullaris: Extends to the L1 level and appears normal. Paraspinal and other soft tissues: Negative. Disc levels: No spinal canal stenosis. No nerve root impingement. Lower lumbar facet arthrosis is moderate-to-severe, particularly at L4-5. IMPRESSION: 1. Small leptomeningeal focus of contrast enhancement on the dorsal aspect of the spinal cord at the T1 level, decreased in craniocaudal extent since 05/15/2021. 2.  No other leptomeningeal metastatic disease. 3. Mild cervical degenerative disc disease without high-grade spinal canal stenosis. 4. Lower lumbar facet arthrosis, which may be a source of local low back pain. Electronically Signed   By: Ulyses Jarred M.D.   On: 11/12/2021 15:00     ASSESSMENT & PLAN: Patient is a 80 y.o. female with history of ovarian cancer with brain mets followed by oncologist Dr. Alvy Bimler  and neuro psychiatrist Dr. Mickeal Skinner.  #) Dysuria-  Patient well appearing. VSS. No abdominal tenderness. Had symptoms of UA that has mostly resoled while on amoxicillin although patient wanting to check UA to make sure more antibiotics are not needed. UA shows small leukocytes and 6-10 WBC with rare bacteria. Patient does not have strong symptoms to suggest further antibiotic course is needed. She has been on multiple rounds of amoxicillin therefore it is best to hold off on additional course to avoid resistance. Patient agreeable to trying short course of pyridium to help with faint dysuria she experienced yesterday, none today. CMP  shows normal kidney function. CBC without acute findings.   #)Hypokalemia-  CMP did show mild hypokalemia 3.0. Will replete with PO potassium. Patient can have potassium rechecked by pcp upon completion. No other significant electrolyte derangement.    Visit Diagnosis: 1. Dysuria   2. Hypokalemia      No orders of the defined types were placed in this encounter.   All questions were answered. The patient knows to call the clinic with any problems, questions or concerns. No barriers to learning was detected.  I have spent a total of 30 minutes minutes of face-to-face and non-face-to-face time, preparing to see the patient, obtaining and/or reviewing separately obtained history, performing a medically appropriate examination, counseling and educating the patient, ordering tests,  documenting clinical information in the electronic health record, and care coordination.     Thank you for allowing me to participate in the care of this patient.    Barrie Folk, PA-C Department of Hematology/Oncology [facility] Phone: [phone]  [phone]    11/28/2021 3:59 PM

## 2021-11-28 NOTE — Patient Instructions (Signed)
-  2 prescription sent to the pharmacy:  -Pyridium: This is to treat bladder irritation.  It is not an antibiotic.  It can discolor your urine to an orange or red color, that is normal.  -Potassium supplements: Your potassium today was slightly low, take these pills as prescribed and your potassium will be rechecked at your next oncology appointment.   If symptoms worsen go to the ER

## 2021-11-29 ENCOUNTER — Telehealth: Payer: Self-pay

## 2021-11-29 LAB — URINE CULTURE: Culture: NO GROWTH

## 2021-11-29 NOTE — Telephone Encounter (Signed)
-----   Message from Heath Lark, MD sent at 11/29/2021  1:14 PM EST ----- Regarding: urine culture is negative Pls call her and let her know despite her symptoms, Urine culture is negative She does not need antibiotics

## 2021-11-29 NOTE — Telephone Encounter (Signed)
Called and given below message. She verbalized understanding and will not take the antibiotics.

## 2021-12-02 ENCOUNTER — Other Ambulatory Visit: Payer: Self-pay | Admitting: Hematology and Oncology

## 2021-12-04 ENCOUNTER — Encounter: Payer: Self-pay | Admitting: Internal Medicine

## 2021-12-09 ENCOUNTER — Other Ambulatory Visit: Payer: Self-pay | Admitting: Hematology and Oncology

## 2021-12-13 ENCOUNTER — Other Ambulatory Visit: Payer: Self-pay | Admitting: Radiation Therapy

## 2021-12-14 ENCOUNTER — Telehealth: Payer: Self-pay

## 2021-12-14 ENCOUNTER — Other Ambulatory Visit: Payer: Self-pay | Admitting: Hematology and Oncology

## 2021-12-14 MED ORDER — AMOXICILLIN 500 MG PO CAPS: 500.0000 mg | ORAL_CAPSULE | Freq: Two times a day (BID) | ORAL | 0 refills | Status: DC

## 2021-12-14 MED ORDER — POTASSIUM CHLORIDE CRYS ER 20 MEQ PO TBCR: 20.0000 meq | EXTENDED_RELEASE_TABLET | Freq: Two times a day (BID) | ORAL | 0 refills | Status: AC

## 2021-12-14 NOTE — Telephone Encounter (Signed)
I refilled K and amoxicillin today For her next UTI, we will have to repeat UA and UCx Her last UCx was negative despite her symptoms, so she need to know that

## 2021-12-14 NOTE — Telephone Encounter (Signed)
Called and given below message. She verbalized understanding. 

## 2021-12-14 NOTE — Telephone Encounter (Signed)
She called and left a message. She is feeling like she is having a UTI again. With cramping and urgency.   She is asking for another refill of the Potassium chloride Rx that was given her in [facility] on 12/28, she will take the last one today.

## 2021-12-28 ENCOUNTER — Ambulatory Visit
Admission: RE | Admit: 2021-12-28 | Discharge: 2021-12-28 | Disposition: A | Discharge status: Home / Self Care | Payer: Medicare Other | Source: Ambulatory Visit | Attending: Internal Medicine | Admitting: Internal Medicine

## 2021-12-28 ENCOUNTER — Other Ambulatory Visit: Payer: Self-pay

## 2021-12-28 DIAGNOSIS — C7949 Secondary malignant neoplasm of other parts of nervous system: Secondary | ICD-10-CM

## 2021-12-28 IMAGING — MR MR HEAD WO/W CM
13 series · 48 of 48 positions shown · IV contrast (multihance)
Comparison: [DATE]

CLINICAL DATA: Brain/CNS neoplasm, assess treatment response.
Leptomeningeal metastases. Ovarian cancer.

EXAM:
MRI HEAD WITHOUT AND WITH CONTRAST
TECHNIQUE: Multiplanar, multiecho pulse sequences of the brain and surrounding
structures were obtained without and with intravenous contrast.
CONTRAST:  15mL MULTIHANCE GADOBENATE DIMEGLUMINE 529 MG/ML IV SOLN

[Series 2: FLAIR · sagittal · 3.0mm · 0.75mm/px · 2 of 39 slices shown (1 of 2)]
[im 1/39]
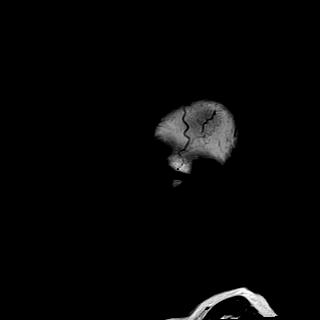
[im 39/39]
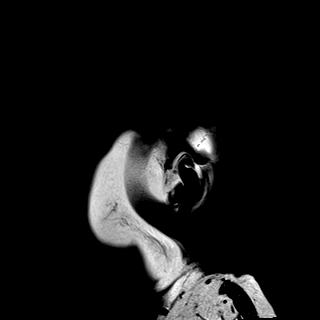

[Series 3: DWI · axial · 3.0mm · 1.50mm/px · z∈[-78,+78]mm · 4 of 82 slices shown (1 of 2)]
[im 1/82]
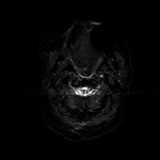
[im 28/82]
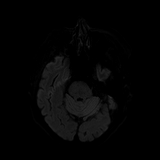
[im 55/82]
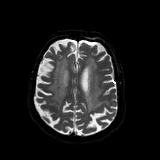
[im 82/82]
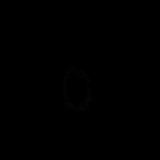

[Series 4: DWI · axial · 3.0mm · 1.50mm/px · z∈[-78,+78]mm · 2 of 41 slices shown (2 of 2)]
[im 1/41]
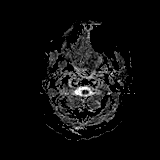
[im 41/41]
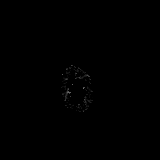

[Series 5: T2 · axial · 5.0mm · 0.57mm/px · 1 of 27 slices shown (1 of 2)]
[im 1/27]
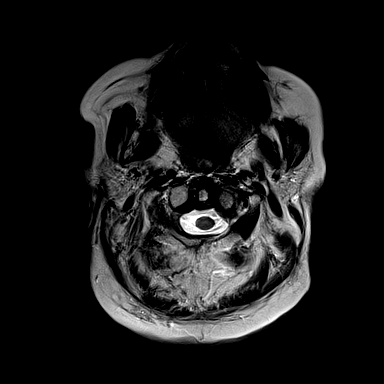

[Series 6: swi_images · axial · 1.5mm · 0.90mm/px · z∈[-77,+78]mm · 5 of 104 slices shown]
[im 1/104]
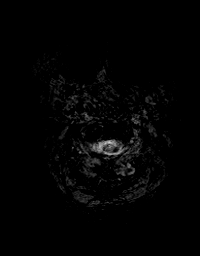
[im 26/104]
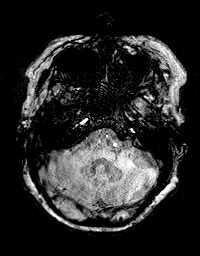
[im 52/104]
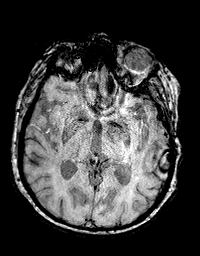
[im 78/104]
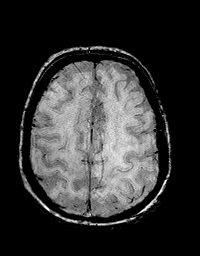
[im 104/104]
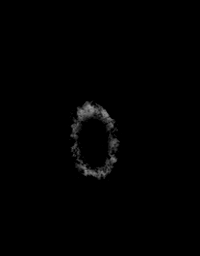

[Series 7: mip_images(sw) · axial · 12.0mm · 0.90mm/px · z∈[-72,+72]mm · 4 of 97 slices shown]
[im 1/97]
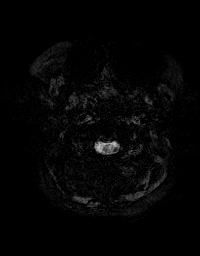
[im 33/97]
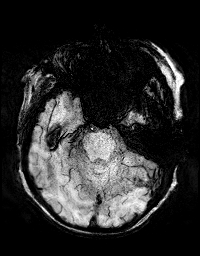
[im 65/97]
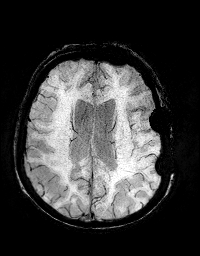
[im 97/97]
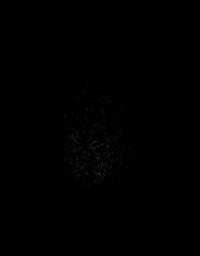

[Series 8: FLAIR · axial · 3.0mm · 0.86mm/px · z∈[-81,+81]mm · 3 of 55 slices shown (2 of 2)]
[im 1/55]
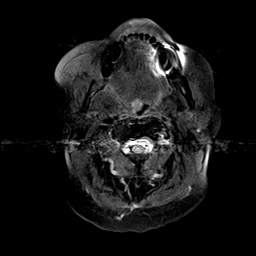
[im 28/55]
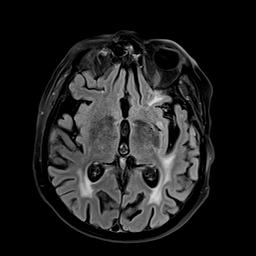
[im 55/55]
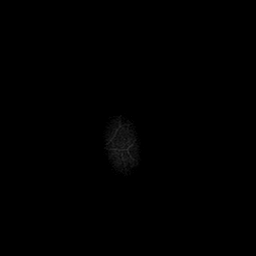

[Series 9: T2 · axial · non-contrast · 1.0mm · 0.86mm/px · z∈[-77,+78]mm · 7 of 160 slices shown (2 of 2)]
[im 1/160]
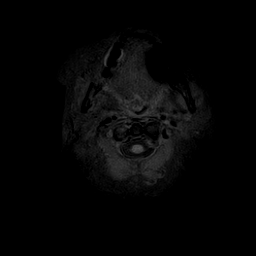
[im 27/160]
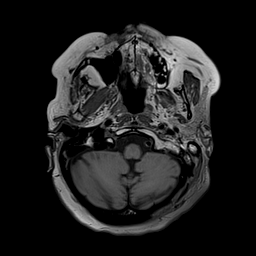
[im 54/160]
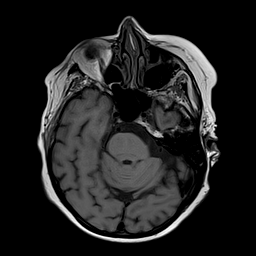
[im 80/160]
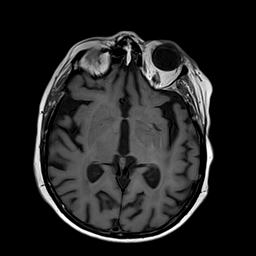
[im 107/160]
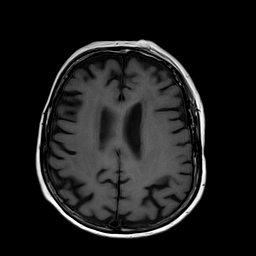
[im 133/160]
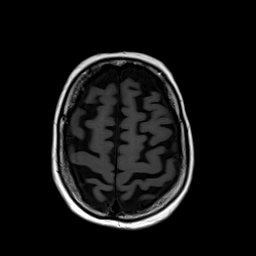
[im 160/160]
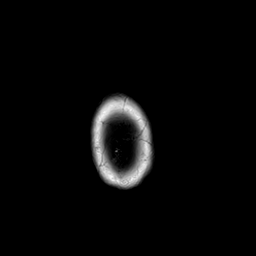

[Series 10: T2 post-contrast · coronal · 3.0mm · 0.57mm/px · 2 of 43 slices shown (1 of 2)]
[im 1/43]
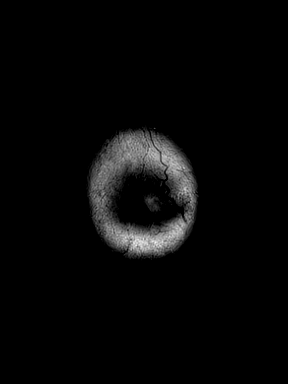
[im 43/43]
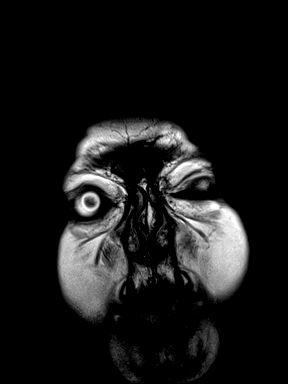

[Series 11: T2 post-contrast · axial · 1.0mm · 0.86mm/px · z∈[-77,+78]mm · 7 of 160 slices shown (2 of 2)]
[im 1/160]
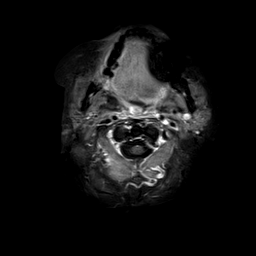
[im 27/160]
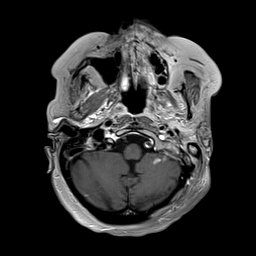
[im 54/160]
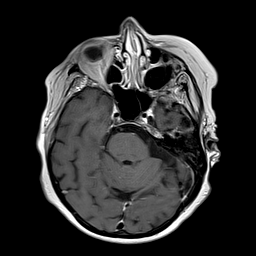
[im 80/160]
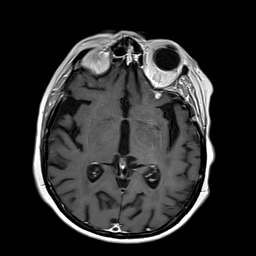
[im 107/160]
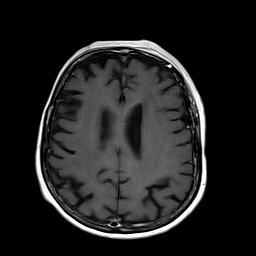
[im 133/160]
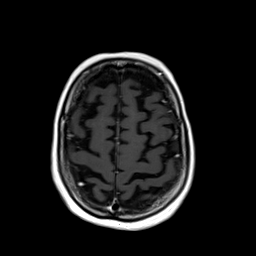
[im 160/160]
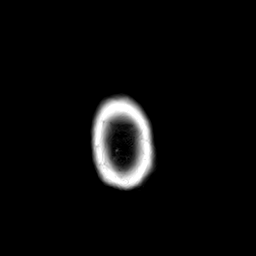

[Series 12: T1 post-contrast · axial · 1.0mm · 0.75mm/px · z∈[-79,+80]mm · 7 of 160 slices shown (1 of 2)]
[im 1/160]
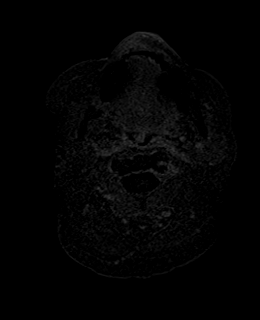
[im 27/160]
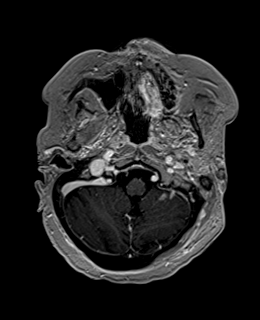
[im 54/160]
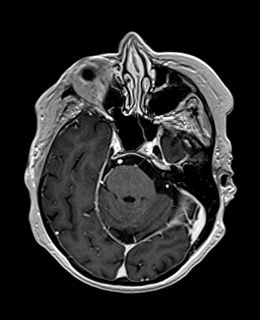
[im 80/160]
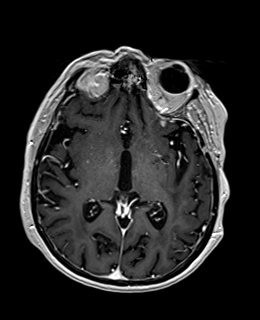
[im 107/160]
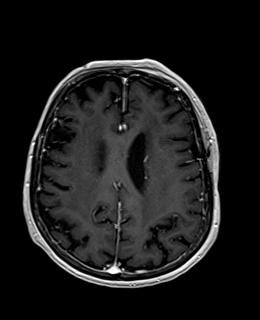
[im 133/160]
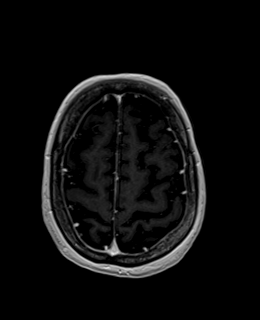
[im 160/160]
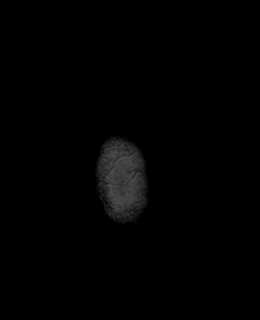

[Series 13: T1 post-contrast · coronal · 3.0mm · 0.57mm/px · 2 of 43 slices shown (2 of 2)]
[im 1/43]
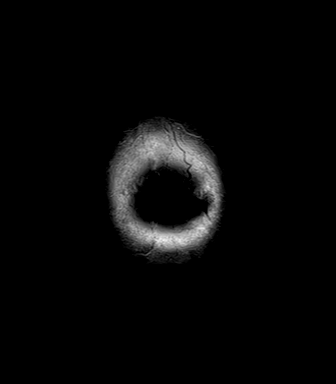
[im 43/43]
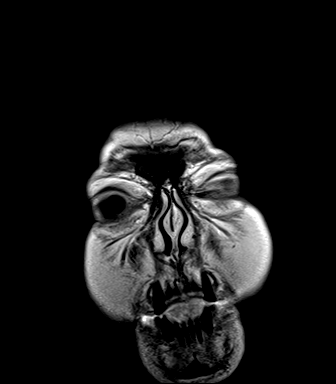

[Series 14: FLAIR post-contrast · sagittal · 3.0mm · 0.75mm/px · 2 of 39 slices shown]
[im 1/39]
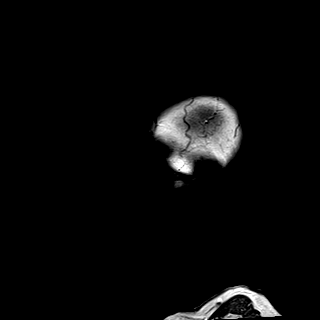
[im 39/39]
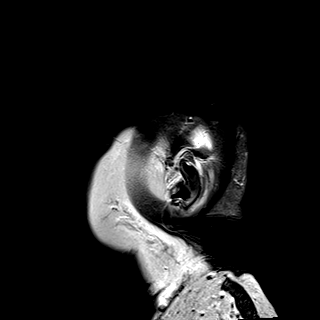

[48 of 48 positions shown; findings below may reference images not displayed]

FINDINGS: Brain: 2 adjacent foci of nodular left a meningeal enhancement along
the inferior surface of the left cerebellar hemisphere measure 6-7
mm in size each and may have each increased by 1-2 mm (series 11,
image 25). A 9 mm enhancing lesion along the orbital surface of the
left frontal lobe may also have increased in size by 1 mm (series
11, image 82). Mild edema in the inferior left frontal lobe is
unchanged. Subtle leptomeningeal enhancement along the anterior left
frontal lobe more superiorly is more conspicuous on today's study
due to technique but is likely unchanged (series 11, image 118).

A resection cavity in the left temporal lobe, associated mild
nonsuspicious enhancement, and surrounding nonenhancing T2
hyperintensity are all unchanged. Confluent nonenhancing T2
hyperintensity throughout the cerebral white matter bilaterally has
mildly increased and likely reflects progressive post radiation
changes. There is mild cerebral atrophy. No acute infarct, midline
shift, hydrocephalus or significant extra-axial fluid collection is
identified.

Vascular: Major intracranial vascular flow voids are preserved.

Skull and upper cervical spine: Left perianal craniotomy. No
suspicious marrow lesion.

Sinuses/Orbits: Left cataract extraction. No significant
inflammatory changes in the paranasal sinuses. No significant
mastoid fluid.

Other: None.
IMPRESSION: Stable to minimally increased size of leptomeningeal metastases
along the inferior surfaces of the left frontal lobe and left
cerebellum.

## 2021-12-28 MED ORDER — GADOBENATE DIMEGLUMINE 529 MG/ML IV SOLN
15.0000 mL | Freq: Once | INTRAVENOUS | Status: AC | PRN
  Administered 2021-12-28: 15 mL via INTRAVENOUS

## 2021-12-30 ENCOUNTER — Other Ambulatory Visit: Payer: Self-pay | Admitting: Hematology and Oncology

## 2021-12-31 ENCOUNTER — Inpatient Hospital Stay: Payer: Medicare Other | Attending: Gynecologic Oncology

## 2021-12-31 ENCOUNTER — Telehealth: Payer: Self-pay

## 2021-12-31 NOTE — Telephone Encounter (Signed)
Called and left a message regarding request for antibiotic from [facility]. Ask her to call the office back.

## 2021-12-31 NOTE — Telephone Encounter (Signed)
It was a misunderstanding and she does not need the antibiotic.

## 2021-12-31 NOTE — Telephone Encounter (Signed)
Called her back. She does not need a antibiotic. No UTI symptoms. It was misunderstanding with pharmacy.

## 2022-01-01 ENCOUNTER — Other Ambulatory Visit: Payer: Self-pay | Admitting: Hematology and Oncology

## 2022-01-03 ENCOUNTER — Other Ambulatory Visit: Payer: Self-pay

## 2022-01-03 ENCOUNTER — Inpatient Hospital Stay: Payer: Medicare Other | Attending: Gynecologic Oncology | Admitting: Internal Medicine

## 2022-01-03 VITALS — BP 142/67 | HR 70 | Temp 97.7°F | Resp 18 | Wt 164.5 lb

## 2022-01-03 DIAGNOSIS — C569 Malignant neoplasm of unspecified ovary: Secondary | ICD-10-CM | POA: Insufficient documentation

## 2022-01-03 DIAGNOSIS — I10 Essential (primary) hypertension: Secondary | ICD-10-CM | POA: Insufficient documentation

## 2022-01-03 DIAGNOSIS — M5481 Occipital neuralgia: Secondary | ICD-10-CM

## 2022-01-03 DIAGNOSIS — C7931 Secondary malignant neoplasm of brain: Secondary | ICD-10-CM | POA: Insufficient documentation

## 2022-01-03 DIAGNOSIS — E785 Hyperlipidemia, unspecified: Secondary | ICD-10-CM | POA: Insufficient documentation

## 2022-01-03 DIAGNOSIS — C7949 Secondary malignant neoplasm of other parts of nervous system: Secondary | ICD-10-CM | POA: Diagnosis not present

## 2022-01-03 DIAGNOSIS — E039 Hypothyroidism, unspecified: Secondary | ICD-10-CM | POA: Diagnosis not present

## 2022-01-03 DIAGNOSIS — Z87891 Personal history of nicotine dependence: Secondary | ICD-10-CM | POA: Diagnosis not present

## 2022-01-03 DIAGNOSIS — Z79899 Other long term (current) drug therapy: Secondary | ICD-10-CM | POA: Diagnosis not present

## 2022-01-03 DIAGNOSIS — Z7952 Long term (current) use of systemic steroids: Secondary | ICD-10-CM | POA: Insufficient documentation

## 2022-01-03 NOTE — Progress Notes (Signed)
[facility] at [address], [place] [address] [phone]   Interval Evaluation  Date of Service: 01/03/22 Patient Name: Connie Sharp Patient MRN: [id number] Patient DOB: [birth year] Provider: Ventura Sellers, MD  Identifying Statement:  Connie Sharp is a 81 y.o. 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  She had abdominal paracentesis at the time   04/28/2017 - 06/10/2017 Chemotherapy   She had 3 cycles of neoadjuvant chemo with carboplatin and paclitaxel in [place]       07/22/2017 Surgery   She had exploratory laparotomy and interval cytoreductive surgery   08/26/2017 - 10/07/2017 Chemotherapy   She received 3 more cycles of postoperative chemotherapy with carboplatin and paclitaxel       02/01/2019 PET scan   Outside PET CT scan showed brain metastasis and cardio phrenic lymph node   02/18/2019 Surgery   She underwent neurosurgical resection of brain mass and postoperative radiation therapy.  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   06/02/2019 PET scan   Repeat outside PET CT scan showed cardiophrenic lymph node unchanged.   08/30/2019 Imaging   Outside MRI late September showed tumor in the roof of the left orbit.   09/06/2019 PET scan   October 2020 PET CT scan abnormalities on the left orbital tumor and stable cardiophrenic nodule.   10/04/2019 Surgery   She underwent radiosurgery for orbital tumor.   11/08/2019 Imaging   Outside follow-up MRI showed reduce in size of  the size of left orbital tumor.   02/07/2020 Imaging   She was noted to have enlarging brain metastasis and receive further radiation treatment.   02/22/2020 - 03/14/2020 Radiation Therapy   She received treatment to left cerebellar lesion, retroclival lesion   05/12/2020 Imaging   Outside MRI and CT scan of the chest, abdomen and pelvis showed disease stability.   08/10/2020 Imaging   Outside CT imaging was reported as stable.  Brain MRI was significant for new infratentorial compartment metastasis.   10/02/2020 -  Radiation Therapy   Date of treatment is unknown but she received further radiation therapy to at least 4 lesions.   10/12/2020 - 02/22/2021 Chemotherapy   She has received concurrent Zejula at 200 mg daily with pembrolizumab       01/10/2021 Imaging   MRI brain done elsewhere showed new leptomeningeal enhancement of the inferior anterior lateral left frontal lobe, new nodular enhancement of the left anterior cingulate gyrus, increased size of nodular leptomeningeal enhancement of bilateral trigeminal nerves, right superior cerebellar and anterior callosal enhancing lesions have increased in size and left vent trolled lateral cerebellar leptomeningeal enhancement is similar compared to before  CT imaging shows stability of cardiophrenic lymph node   01/17/2021 - 01/31/2021 Radiation Therapy   She received whole brain radiation therapy, 30 Gy in 10 fractions   01/31/2021 -  Radiation Therapy   She received whole brain radiation therapy, 30 Gy completed on 01/31/2021   02/21/2021 Tumor Marker   Patient's tumor was tested for the following markers: CA-125 Results of the tumor marker test revealed 58   02/26/2021 Imaging  Outside MRI spine showed leptomeningeal disease in the cervical and thoracic cord, bulky at T1 with significant epidural lesion in that location   03/12/2021 Cancer Staging   Staging form: Ovary, Fallopian Tube, and Primary Peritoneal Carcinoma, AJCC 8th Edition -  Clinical stage from 03/12/2021: Stage IV (rcT3, cN0, pM1) - Signed by Heath Lark, MD on 03/12/2021 Stage prefix: Recurrence    03/12/2021 Tumor Marker   Patient's tumor was tested for the following markers: CA-125 Results of the tumor marker test revealed 45.8     Interval History:  Connie Sharp presents today for follow up after recent MRI brain and spine.  Today she describes new onset pain in the back of the left side of her head, radiating up from her neck.  Pain is sharp in nature, occurs daily, may be reproduced by particular head positioning.  Facial and lower leg numbness is reported as stable.  Connie Sharp does describe some uptick in short term memory impairment.  Continues to ambulate independently, takes care of ADLs.  Fatigue continues to be a problem, she naps occasionally.  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  She describes ~6 weeks history of numbness in her legs, left more than right.  It comes and goes, moves up as high as her hip, sometimes just in the foot itself.  She also describes clear decline in her balance, she is now often walking with a cane.  At home, going from room to room, she does not need the cane.  Otherwise denies headaches, seizures, double vision, cognitive impairment, dysuria, saddle numbness.  Plan was for spine radiation in [place], but she has moved to [place] to be closer to her family.  Medications: Current Outpatient Medications on File Prior to Visit  Medication Sig Dispense Refill   amoxicillin (AMOXIL) 500 MG capsule Take 1 capsule by mouth 2 times daily. 14 capsule 0   amoxicillin (AMOXIL) 500 MG tablet Take 1 tablet (500 mg total) by mouth 2 (two) times daily. 14 tablet 0   Cholecalciferol (VITAMIN D3) 50 MCG (2000 UT) TABS Take 1 tablet by mouth daily.     dexamethasone (DECADRON) 1 MG tablet TAKE 1 TABLET BY MOUTH EVERY DAY 30 tablet 1   docusate sodium  (COLACE) 100 MG capsule Take 100 mg by mouth 2 (two) times daily.     doxylamine, Sleep, (UNISOM) 25 MG tablet Take 25 mg by mouth at bedtime as needed.     fluticasone (VERAMYST) 27.5 MCG/SPRAY nasal spray Place 2 sprays into the nose daily.     levothyroxine (SYNTHROID) 50 MCG tablet Take 50 mcg by mouth daily before breakfast.     LORazepam (ATIVAN) 1 MG tablet TAKE 1 TABLET BY MOUTH EVERYDAY AT BEDTIME 30 tablet 1   Melatonin 10 MG TABS Take by mouth.     pantoprazole (PROTONIX) 40 MG tablet Take 40 mg by mouth at bedtime.     polyethylene glycol (MIRALAX / GLYCOLAX) 17 g packet Take 17 g by mouth daily.     potassium chloride SA (KLOR-CON M) 20 MEQ tablet Take 1 tablet (20 mEq total) by mouth 2 (two) times daily. 60 tablet 0   propranolol ER (INDERAL LA) 120 MG 24 hr capsule TAKE 1 CAPSULE BY MOUTH EVERY DAY 30 capsule 1   Sennosides (SENNA) 8.6 MG CAPS Take 2 capsules by mouth 2 (two) times daily.     timolol (TIMOPTIC-XR) 0.25 % ophthalmic gel-forming  Place 1 drop into both eyes daily.     traZODone (DESYREL) 50 MG tablet Take 0.5-1 tablets (25-50 mg total) by mouth at bedtime. 30 tablet 0   No current facility-administered medications on file prior to visit.    Allergies:  Allergies  Allergen Reactions   Ambien [Zolpidem Tartrate] Other (See Comments)    Delirium   Cephalexin Nausea And Vomiting, Hives and Rash   Cefaclor Other (See Comments) and Hives    unknown   Noroxin [Norfloxacin] Other (See Comments)    unknown   Scallops [Shellfish Allergy] Nausea And Vomiting and Swelling   Past Medical History:  Past Medical History:  Diagnosis Date   Anemia    Atrophic vaginitis 09/29/2012   Brain cancer ([place])    metastasis   Degenerative, intervertebral disc, cervical    Diverticulosis    Fallopian tube cancer, carcinoma ([place]) 11/02/2018   GERD (gastroesophageal reflux disease)    Glaucoma    left eye   Hx of migraines    Hyperlipidemia    Hypertension    Injury of  right rotator cuff    Kidney stones    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)    Thyroid disease    hypothyroid   Past Surgical History:  Past Surgical History:  Procedure Laterality Date   ABDOMINAL HYSTERECTOMY     1987   BILATERAL SALPINGOOPHORECTOMY     07-22-2017   CHOLECYSTECTOMY  08/02/2017   COLONOSCOPY  08/2015   EYE SURGERY Left 2004   Left eye wrikleremoval   NOSE SURGERY  09/05/2014   Fractured Nose    Plastic reconstructive      Scar tissue left breast at age 46   TONSILLECTOMY AND ADENOIDECTOMY     Social History:  Social History   Socioeconomic History   Marital status: Married    Spouse name: Not on file   Number of children: Not on file   Years of education: Not on file   Highest education level: Not on file  Occupational History   Not on file  Tobacco Use   Smoking status: Former    Packs/day: 0.25    Years: 4.00    Pack years: 1.00    Types: Cigarettes    Quit date: 03/07/1965    Years since quitting: 56.8   Smokeless tobacco: Never  Vaping Use   Vaping Use: Never used  Substance and Sexual Activity   Alcohol use: Yes    Comment: occasionally   Drug use: Never   Sexual activity: Not Currently  Other Topics Concern   Not on file  Social History Narrative   Not on file   Social Determinants of Health   Financial Resource Strain: Not on file  Food Insecurity: Not on file  Transportation Needs: Not on file  Physical Activity: Not on file  Stress: Not on file  Social Connections: Not on file  Intimate Partner Violence: Not At Risk   Fear of Current or Ex-Partner: No   Emotionally Abused: No   Physically Abused: No   Sexually Abused: No   Family History:  Family History  Problem Relation Age of Onset   Hypertension Mother    Congestive Heart Failure Mother    Hypercholesterolemia Mother    Macular degeneration Father    Non-Hodgkin's lymphoma Brother    Colon cancer  Neg Hx    Ovarian cancer Neg Hx    Breast cancer Neg  Hx    Endometrial cancer Neg Hx    Pancreatic cancer Neg Hx    Prostate cancer Neg Hx     Review of Systems: Constitutional: Doesn't report fevers, chills or abnormal weight loss Eyes: Doesn't report blurriness of vision Ears, nose, mouth, throat, and face: Doesn't report sore throat Respiratory: Doesn't report cough, dyspnea or wheezes Cardiovascular: Doesn't report palpitation, chest discomfort  Gastrointestinal:  Doesn't report nausea, constipation, diarrhea GU: Doesn't report incontinence Skin: Doesn't report skin rashes Neurological: Per HPI Musculoskeletal: Doesn't report joint pain Behavioral/Psych: Doesn't report anxiety  Physical Exam: Vitals:   01/03/22 1035  BP: (!) 142/67  Pulse: 70  Resp: 18  Temp: 97.7 F (36.5 C)  SpO2: 99%   KPS: 70. General: Alert, cooperative, pleasant, in no acute distress Head: Normal EENT: No conjunctival injection or scleral icterus.  Lungs: Resp effort normal Cardiac: Regular rate Abdomen: Non-distended abdomen Skin: No rashes cyanosis or petechiae. Extremities: No clubbing or edema  Neurologic Exam: Mental Status: Awake, alert, attentive to examiner. Oriented to self and environment. Language is fluent with intact comprehension.  Cranial Nerves: Visual acuity is grossly normal. Visual fields are full. Extra-ocular movements intact. No ptosis. Face is symmetric Motor: Tone and bulk are normal. Power is full in both arms and legs. Reflexes are decreased, no pathologic reflexes present.  Sensory: Impaired in stocking pattern Gait: Wide based gait  Labs: I have reviewed the data as listed    Component Value Date/Time   NA 141 11/28/2021 1343   K 3.0 (L) 11/28/2021 1343   CL 104 11/28/2021 1343   CO2 31 11/28/2021 1343   GLUCOSE 73 11/28/2021 1343   BUN 17 11/28/2021 1343   CREATININE 0.77 11/28/2021 1343   CREATININE 0.85 10/12/2021 1322   CALCIUM 8.9 11/28/2021 1343    PROT 6.3 (L) 11/28/2021 1343   ALBUMIN 3.8 11/28/2021 1343   AST 10 (L) 11/28/2021 1343   AST 13 (L) 10/12/2021 1322   ALT 12 11/28/2021 1343   ALT 12 10/12/2021 1322   ALKPHOS 47 11/28/2021 1343   BILITOT 1.1 11/28/2021 1343   BILITOT 1.1 10/12/2021 1322   GFRNONAA >60 11/28/2021 1343   GFRNONAA >60 10/12/2021 1322   Lab Results  Component Value Date   WBC 8.7 11/28/2021   NEUTROABS 6.9 11/28/2021   HGB 13.5 11/28/2021   HCT 40.3 11/28/2021   MCV 95.5 11/28/2021   PLT 209 11/28/2021   Imaging:  [facility] Clinician Interpretation: I have personally reviewed the CNS images as listed.  My interpretation, in the context of the patient's clinical presentation, is treatment effect vs true progression  MR BRAIN W WO CONTRAST  Result Date: 12/28/2021 CLINICAL DATA:  Brain/CNS neoplasm, assess treatment response. Leptomeningeal metastases. Ovarian cancer. EXAM: MRI HEAD WITHOUT AND WITH CONTRAST TECHNIQUE: Multiplanar, multiecho pulse sequences of the brain and surrounding structures were obtained without and with intravenous contrast. CONTRAST:  49m MULTIHANCE GADOBENATE DIMEGLUMINE 529 MG/ML IV SOLN COMPARISON:  11/12/2021 FINDINGS: Brain: 2 adjacent foci of nodular left a meningeal enhancement along the inferior surface of the left cerebellar hemisphere measure 6-7 mm in size each and may have each increased by 1-2 mm (series 11, image 25). A 9 mm enhancing lesion along the orbital surface of the left frontal lobe may also have increased in size by 1 mm (series 11, image 82). Mild edema in the inferior left frontal lobe is unchanged. Subtle leptomeningeal enhancement along the anterior left frontal lobe more superiorly is more conspicuous on today's  study due to technique but is likely unchanged (series 11, image 118). A resection cavity in the left temporal lobe, associated mild nonsuspicious enhancement, and surrounding nonenhancing T2 hyperintensity are all unchanged. Confluent nonenhancing  T2 hyperintensity throughout the cerebral white matter bilaterally has mildly increased and likely reflects progressive post radiation changes. There is mild cerebral atrophy. No acute infarct, midline shift, hydrocephalus or significant extra-axial fluid collection is identified. Vascular: Major intracranial vascular flow voids are preserved. Skull and upper cervical spine: Left perianal craniotomy. No suspicious marrow lesion. Sinuses/Orbits: Left cataract extraction. No significant inflammatory changes in the paranasal sinuses. No significant mastoid fluid. Other: None. IMPRESSION: Stable to minimally increased size of leptomeningeal metastases along the inferior surfaces of the left frontal lobe and left cerebellum. Electronically Signed   By: Logan Bores M.D.   On: 12/28/2021 21:27     Assessment/Plan Leptomeningeal metastases ([facility])  Connie Sharp is clinically stable today.  MRI brain again demonstrates slow growth of two previously treated metastases.  Etiology is either refractory/recurrent treated disease, or delayed radiation treatment effect.    We again discussed options moving forward, including salvage radiosurgery, further imaging surveillance, hospice referral.  She would prefer to continue with MRI surveillance; we will recommend repeat study in 3 months for further characterization.  No steroids at this time.  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  We will refer to Dr. Davy Pique for localized nerve block.    We appreciate the opportunity to participate in the care of Connie Sharp.    We ask that Connie Sharp return to clinic in 3 months following next brain MRI, or sooner as needed.  All questions were answered. The patient knows to call the clinic with any problems, questions or concerns. No barriers to learning were detected.  The total time spent in the encounter was 40 minutes and more than 50% was on counseling and review of test  results   Ventura Sellers, MD Medical Director of Neuro-Oncology [facility] at [place] 01/03/22 10:31 AM

## 2022-01-04 ENCOUNTER — Telehealth: Payer: Self-pay | Admitting: Internal Medicine

## 2022-01-04 NOTE — Telephone Encounter (Signed)
Scheduled per 2/2 los, pt has been called and confirmed  °

## 2022-01-05 ENCOUNTER — Other Ambulatory Visit: Payer: Self-pay | Admitting: Hematology and Oncology

## 2022-01-07 ENCOUNTER — Telehealth: Payer: Self-pay | Admitting: *Deleted

## 2022-01-07 ENCOUNTER — Other Ambulatory Visit: Payer: Self-pay | Admitting: Internal Medicine

## 2022-01-07 MED ORDER — GABAPENTIN 300 MG PO CAPS
300.0000 mg | ORAL_CAPSULE | Freq: Two times a day (BID) | ORAL | 3 refills | Status: AC
Start: 2022-01-07 — End: ?

## 2022-01-07 NOTE — Telephone Encounter (Signed)
Patient called about her referral for nerve block.  Dr Dionne Ano office wasn't to get her scheduled until January 30, 2022.  Patient was extremely upset because she states the pain is unbearable.   I called Dr Dionne Ano office to see if they were able to push the patient up any sooner and they are not able to at this time.    Patient is asking for something stronger than OTC that will help with the pain.  She states is it very bad and waiting that long without anything would be difficult.

## 2022-01-07 NOTE — Telephone Encounter (Signed)
Communicated new Rx to the patient.  She expressed understanding and will let us know if this does not help.

## 2022-01-08 ENCOUNTER — Telehealth: Payer: Self-pay | Admitting: *Deleted

## 2022-01-08 NOTE — Telephone Encounter (Signed)
Received call from Will Rogers from [facility].  Patient is being followed by palliative with them.  He was visiting with the patient and patient reported that the gabapentin helped tremendously however, it cause too much sedation.    Per Dr Mickeal Skinner patient can decrease Gabapentin from 300 mg BID to 300 mg at bedtime.  If this still alleviates the pain but is still too strong we can decrease the dose further.  Patient is aware to call us if this needs to happen.

## 2022-01-14 ENCOUNTER — Telehealth: Payer: Self-pay

## 2022-01-14 NOTE — Telephone Encounter (Signed)
Returned call to son. His Dad is much better and doing well at home after recently being in the hospital. [name] is still covid negative. Yoana tested positive for covid on Saturday 2/11 with a home test. She is having mild symptoms. Fever 99.1 and fatigue. He does not think she needs to take antiviral. Zalma wanted to  Dr. Alvy Bimler to be aware. [name] will call the office back if needed.

## 2022-01-16 ENCOUNTER — Telehealth: Payer: Self-pay

## 2022-01-16 NOTE — Telephone Encounter (Signed)
Pt's son called asking if it is possible for both parents who are both pts of Dr Alvy Bimler to have physical therapy. [name] states both parents have experienced weakness and are both fairly sedentary. [name] states Mrs Connie Sharp had two falls this weekend but was not injured. Renaye Rakers I would consult with MD about PT referral and we would be back in touch with him. He verbalized thanks and understanding.

## 2022-01-17 ENCOUNTER — Other Ambulatory Visit: Payer: Self-pay | Admitting: Hematology and Oncology

## 2022-01-17 DIAGNOSIS — C569 Malignant neoplasm of unspecified ovary: Secondary | ICD-10-CM

## 2022-01-17 DIAGNOSIS — C7949 Secondary malignant neoplasm of other parts of nervous system: Secondary | ICD-10-CM

## 2022-01-18 ENCOUNTER — Telehealth: Payer: Self-pay

## 2022-01-18 NOTE — Telephone Encounter (Signed)
Called [name]. His mom left a message regarding messages that she is getting from . He will assist her and call the office back if needed.

## 2022-01-22 ENCOUNTER — Other Ambulatory Visit: Payer: Self-pay

## 2022-01-22 MED ORDER — PANTOPRAZOLE SODIUM 40 MG PO TBEC: 40.0000 mg | DELAYED_RELEASE_TABLET | Freq: Every evening | ORAL | 3 refills | Status: DC

## 2022-01-23 ENCOUNTER — Telehealth: Payer: Self-pay

## 2022-01-23 NOTE — Telephone Encounter (Signed)
Pt called and asks for refill on KLOR-CON. Pt asks what this medication is for. Educated pt on use of medication. Advised pt I would make MD aware of refill request. She verbalized thanks and understanding.

## 2022-01-23 NOTE — Telephone Encounter (Signed)
In general I do not prescribe long term potassium especially when her K level is at least 3 and we are not monitoring her levels I suggest potassium rich food, typically, we give patients recommendation for food with high potassium It is available in my office and Hassan Rowan knows where it is stored

## 2022-01-24 ENCOUNTER — Other Ambulatory Visit: Payer: Self-pay | Admitting: Hematology and Oncology

## 2022-01-24 ENCOUNTER — Telehealth: Payer: Self-pay

## 2022-01-24 NOTE — Telephone Encounter (Signed)
Called and given below message to [facility] from Dr. Alvy Bimler. In general I do not prescribe long term potassium especially when her K level is at least 3 and we are not monitoring her levels I suggest potassium rich food, typically, we give patients recommendation for food with high potassium. Read thru the potassium food list. She verbalized understanding.

## 2022-01-27 ENCOUNTER — Other Ambulatory Visit: Payer: Self-pay | Admitting: Hematology and Oncology

## 2022-02-02 ENCOUNTER — Other Ambulatory Visit: Payer: Self-pay | Admitting: Internal Medicine

## 2022-02-04 ENCOUNTER — Other Ambulatory Visit: Payer: Self-pay

## 2022-02-04 ENCOUNTER — Ambulatory Visit: Payer: Medicare Other | Attending: Hematology and Oncology

## 2022-02-04 DIAGNOSIS — C7949 Secondary malignant neoplasm of other parts of nervous system: Secondary | ICD-10-CM | POA: Diagnosis not present

## 2022-02-04 DIAGNOSIS — R262 Difficulty in walking, not elsewhere classified: Secondary | ICD-10-CM | POA: Insufficient documentation

## 2022-02-04 DIAGNOSIS — R5381 Other malaise: Secondary | ICD-10-CM | POA: Insufficient documentation

## 2022-02-04 DIAGNOSIS — R296 Repeated falls: Secondary | ICD-10-CM | POA: Insufficient documentation

## 2022-02-04 DIAGNOSIS — C569 Malignant neoplasm of unspecified ovary: Secondary | ICD-10-CM | POA: Diagnosis present

## 2022-02-04 DIAGNOSIS — M6281 Muscle weakness (generalized): Secondary | ICD-10-CM | POA: Insufficient documentation

## 2022-02-04 NOTE — Therapy (Signed)
OUTPATIENT PHYSICAL THERAPY ONCOLOGY EVALUATION  Patient Name: Connie Sharp MRN: [id number] DOB:01/10/1941, 81 y.o., female Today's Date: 02/04/2022   PT End of Session - 02/04/22 1551     Visit Number 1    Number of Visits 12    Date for PT Re-Evaluation 03/18/22    Authorization Type Medicare    PT Start Time 1503    PT Stop Time 1550    PT Time Calculation (min) 47 min             Past Medical History:  Diagnosis Date   Anemia    Atrophic vaginitis 09/29/2012   Brain cancer ([place])    metastasis   Degenerative, intervertebral disc, cervical    Diverticulosis    Fallopian tube cancer, 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   GERD (gastroesophageal reflux disease)    Glaucoma    left eye   Hx of migraines    Hyperlipidemia    Hypertension    Injury of right rotator cuff    Kidney stones    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   Migraine    Osteoporosis 10/18/2011   Ovarian cancer ([place])    Thyroid disease    hypothyroid   Past Surgical History:  Procedure Laterality Date   ABDOMINAL HYSTERECTOMY     1987   BILATERAL SALPINGOOPHORECTOMY     07-22-2017   CHOLECYSTECTOMY  08/02/2017   COLONOSCOPY  08/2015   EYE SURGERY Left 2004   Left eye wrikleremoval   NOSE SURGERY  09/05/2014   Fractured Nose    Plastic reconstructive      Scar tissue left breast at age 89   [place]     Patient Active Problem List   Diagnosis Date Noted   UTI (urinary tract infection) 11/01/2021   Other fatigue 04/10/2021   Dysuria 04/02/2021   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   Acquired hypothyroidism 03/12/2021   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   Other constipation 03/12/2021   Goals of care, counseling/discussion 03/12/2021   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 Sometimes she has difficulty getting up and down from chairs. 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 Pt reports she has been given until August to live. Pt also has awful pain in the back of her head for which she got shots for occipital neuritis.  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  She had abdominal paracentesis at the time; 04/28/2017 - 06/10/2017 Chemotherapy-3 cycles of neoadjuvant chemo with carboplatin and paclitaxel in [place]; 07/22/2017- exploratory laparotomy and interval cytoreductive surgery. 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 Per pt, she has been given until August to live. Also has Glaucoma, and thyroid disease and is a significant fall risk.  PAIN:  Are you having pain? Yes NPRS scale: 9/10 Pain location: left neck Pain orientation: Left  PAIN TYPE: sharp Pain description: intermittent  Aggravating factors: turning to the  left Relieving factors: injection,   PRECAUTIONS: Other: Fall risk,Brain Cancer , Glaucoma, Thyroid disease  WEIGHT BEARING RESTRICTIONS No, but does use a SBQC  FALLS:  Has patient fallen in last 6 months? Yes, Number of falls: 4-5  LIVING ENVIRONMENT: Lives with: lives with their spouse Lives in: House/apartment Stairs: No; External: 1 steps; none Has following equipment at home: Quad cane small base walker sometimes especially first thing in the am  OCCUPATION: NA  LEISURE: some cooking, singing,  HAND DOMINANCE : right   PRIOR LEVEL OF FUNCTION: Independent with household mobility with device  PATIENT GOALS improve balance, increase strength, get up and down from chairs    OBJECTIVE  COGNITION:  Overall cognitive status: Within functional limits for tasks assessed. Son has concerns about Short term memory   PALPATION: NA  OBSERVATIONS / OTHER ASSESSMENTS: pt ambulates with SBQC in right UE, she does not plant all 4 feet of cane, small steps with Wide BOS  SENSATION:  Light touch: Deficits stocking CIPN    POSTURE: forward head rounded shoulders    CERVICAL AROM: 85% limited with left rotation    Percent limited                           LE MMT: 4/5 bilaterally except hip IR 4-/5 (tested in sitting)   (FUNCTIONAL TESTS:  30 second Chair stand test   4 repetitions  4 Position Balance Test: Able to maintain feet together x 10 sec, and half tandem stance bilaterally with excessive sway. Unable to maintain tandem stance or SLS for greater than 1-2 seconds.  TUG  22 seconds GAIT: Distance walked: 50 ft Assistive device utilized: Quad cane small base Level of assistance: CGA Comments: not planting cane firmly, small steps, larger BOS         TODAY'S TREATMENT  Pt was educated in proper gait using SBQC in right hand and fully planting all 4 legs at a time for improved safety.  PATIENT EDUCATION:  Education details: Personnel officer with  [employer] Person educated: Patient Education method: Demonstration Education comprehension: returned demonstration   HOME EXERCISE PROGRAM: Not given  ASSESSMENT:  CLINICAL IMPRESSION: Patient is a 81 y.o. female who was seen today for physical therapy evaluation and treatment for balance deficits and weakness leading to falls and functional deficits.  Pt is a significant fall risk based on functional testing today and will benefit from skilled PT to address balance, gait, strength and function.    OBJECTIVE IMPAIRMENTS Abnormal gait, decreased activity tolerance, decreased balance, difficulty walking, and decreased strength.   ACTIVITY LIMITATIONS cleaning, community activity, driving, and household chores .   PERSONAL FACTORS Age and 3+ comorbidities: Leptomeningeal METS,Brain Cancer, Glaucoma, Thyroid disease  are also affecting patient's functional outcome.    REHAB POTENTIAL: Good  CLINICAL DECISION MAKING: Stable/uncomplicated  EVALUATION COMPLEXITY: Low  GOALS: Goals reviewed with patient? Yes  SHORT TERM GOALS:  Pt will be independent and compliant with a HEP to improve strength and balance Baseline: None Target date: 02/25/2022 Goal status: INITIAL   LONG TERM GOALS:  Pt will perform 6 sit to stands in 30 seconds for decreased fall risk Baseline: 4 Target date: 03/18/2022 Goal status: INITIAL  2.  Pt will perform TUG in 17 sec or less for decreased fall risk Baseline: 22 seconds Target date: 03/18/2022 Goal status: INITIAL  3.  Pt will be able to maintain tandem stance bilateral for 5-10 seconds for decreased fall risk. Baseline: 1-2 sec Target date: 03/18/2022 Goal status: INITIAL  4.  Pt will be able to rise from a regular height chair without use of hands Baseline: uses hands, sometimes loses balance Target date: 03/18/2022 Goal status: INITIAL PLAN: PT FREQUENCY: 2x/week  PT DURATION: 6 weeks  PLANNED INTERVENTIONS: Therapeutic exercises,  Therapeutic activity, Neuromuscular re-education, Balance training, Gait training, Patient/Family education, Cognitive remediation, and Manual therapy  PLAN FOR NEXT SESSION: gait training with SBQC, strengthening; mini squats, sit to stand,etc, balance reeducation   Claris Pong, PT 02/04/2022, 3:53 PM

## 2022-02-06 ENCOUNTER — Telehealth: Payer: Self-pay | Admitting: *Deleted

## 2022-02-06 ENCOUNTER — Other Ambulatory Visit: Payer: Self-pay | Admitting: Hematology and Oncology

## 2022-02-06 DIAGNOSIS — E039 Hypothyroidism, unspecified: Secondary | ICD-10-CM

## 2022-02-06 DIAGNOSIS — C569 Malignant neoplasm of unspecified ovary: Secondary | ICD-10-CM

## 2022-02-06 NOTE — Telephone Encounter (Signed)
This RN attempted to contact the pt per need for either review of TSH done at her primary MD in [place] or need to draw lab here for proper dosing and refill. ? ?Obtained pt's identified VM- detailed message left stating above with this RN's name and return call number left for further follow up. ? ? ?

## 2022-02-06 NOTE — Telephone Encounter (Signed)
This RN spoke with pt per her return call regarding need for lab value for refill of the levothyroxine. ? ?Pt states she has not had a lab for this medication in a long time - " just been on the medication forever" ? ?Per discussion pt can come in tomorrow for lab at 1 pm. ? ?Appt scheduled by this RN. ? ?This note will be routed to MD for appropriate lab orders. ?

## 2022-02-07 ENCOUNTER — Inpatient Hospital Stay: Payer: Medicare Other | Attending: Gynecologic Oncology

## 2022-02-07 ENCOUNTER — Telehealth: Payer: Self-pay

## 2022-02-07 ENCOUNTER — Other Ambulatory Visit: Payer: Self-pay

## 2022-02-07 DIAGNOSIS — C569 Malignant neoplasm of unspecified ovary: Secondary | ICD-10-CM | POA: Diagnosis not present

## 2022-02-07 DIAGNOSIS — E039 Hypothyroidism, unspecified: Secondary | ICD-10-CM

## 2022-02-07 DIAGNOSIS — C7931 Secondary malignant neoplasm of brain: Secondary | ICD-10-CM | POA: Diagnosis present

## 2022-02-07 LAB — BASIC METABOLIC PANEL - CANCER CENTER ONLY
Anion gap: 6 (ref 5–15)
BUN: 18 mg/dL (ref 8–23)
CO2: 31 mmol/L (ref 22–32)
Calcium: 8.9 mg/dL (ref 8.9–10.3)
Chloride: 105 mmol/L (ref 98–111)
Creatinine: 0.75 mg/dL (ref 0.44–1.00)
GFR, Estimated: >60 mL/min
Glucose, Bld: 88 mg/dL (ref 70–99)
Potassium: 4 mmol/L (ref 3.5–5.1)
Sodium: 142 mmol/L (ref 135–145)

## 2022-02-07 LAB — CBC WITH DIFFERENTIAL/PLATELET
Abs Immature Granulocytes: 0.09 10*3/uL — ABNORMAL HIGH (ref 0.00–0.07)
Basophils Absolute: 0 10*3/uL (ref 0.0–0.1)
Basophils Relative: 0 %
Eosinophils Absolute: 0 10*3/uL (ref 0.0–0.5)
Eosinophils Relative: 0 %
HCT: 35.4 % — ABNORMAL LOW (ref 36.0–46.0)
Hemoglobin: 12.3 g/dL (ref 12.0–15.0)
Immature Granulocytes: 1 %
Lymphocytes Relative: 10 %
Lymphs Abs: 0.8 10*3/uL (ref 0.7–4.0)
MCH: 33.1 pg (ref 26.0–34.0)
MCHC: 34.7 g/dL (ref 30.0–36.0)
MCV: 95.2 fL (ref 80.0–100.0)
Monocytes Absolute: 0.7 10*3/uL (ref 0.1–1.0)
Monocytes Relative: 9 %
Neutro Abs: 6.4 10*3/uL (ref 1.7–7.7)
Neutrophils Relative %: 80 %
Platelets: 212 10*3/uL (ref 150–400)
RBC: 3.72 MIL/uL — ABNORMAL LOW (ref 3.87–5.11)
RDW: 14.6 % (ref 11.5–15.5)
WBC: 8 10*3/uL (ref 4.0–10.5)
nRBC: 0 % (ref 0.0–0.2)

## 2022-02-07 LAB — TSH: TSH: 0.616 u[IU]/mL (ref 0.308–3.960)

## 2022-02-07 NOTE — Telephone Encounter (Signed)
Called and given below message. She verbalized understanding and appreciated the call . °

## 2022-02-07 NOTE — Telephone Encounter (Signed)
-----   Message from Heath Lark, MD sent at 02/07/2022  3:34 PM EST ----- ?Received refill orders for ativan and synthroid ?Her repeat TSH is borderline low, meaning, I need to reduce her dose to 25 mcg from 52mg ?Please call her and let her know ? ?

## 2022-02-08 ENCOUNTER — Telehealth: Payer: Self-pay

## 2022-02-08 NOTE — Telephone Encounter (Signed)
Returned call to [place] and spoke with daughter. ?Rx for Lorazepam sent to yesterday to pharmacy. They are refilling now. She was a few days early with the Rx. ?

## 2022-02-11 ENCOUNTER — Telehealth: Payer: Self-pay

## 2022-02-11 ENCOUNTER — Other Ambulatory Visit: Payer: Self-pay | Admitting: Hematology and Oncology

## 2022-02-11 DIAGNOSIS — L600 Ingrowing nail: Secondary | ICD-10-CM

## 2022-02-11 DIAGNOSIS — C569 Malignant neoplasm of unspecified ovary: Secondary | ICD-10-CM

## 2022-02-11 NOTE — Telephone Encounter (Signed)
I sent in a referral ?I do not have a specific person to recommend. All podiatrists within [facility] are good ?

## 2022-02-11 NOTE — Telephone Encounter (Signed)
Called and given below message to son. He verbalized understanding. Appt already scheduled with podiatry. ?

## 2022-02-11 NOTE — Telephone Encounter (Signed)
[name] called and left a message. His Mom has ingrown toenails on both feet he thinks. ?She needs to see a podiatrist. Asking for you to recommend someone in the [facility] system. ?

## 2022-02-13 ENCOUNTER — Ambulatory Visit: Payer: Medicare Other

## 2022-02-13 ENCOUNTER — Other Ambulatory Visit: Payer: Self-pay

## 2022-02-13 DIAGNOSIS — M6281 Muscle weakness (generalized): Secondary | ICD-10-CM

## 2022-02-13 DIAGNOSIS — R296 Repeated falls: Secondary | ICD-10-CM

## 2022-02-13 DIAGNOSIS — C7949 Secondary malignant neoplasm of other parts of nervous system: Secondary | ICD-10-CM | POA: Diagnosis not present

## 2022-02-13 DIAGNOSIS — R5381 Other malaise: Secondary | ICD-10-CM

## 2022-02-13 NOTE — Patient Instructions (Signed)
Access Code: [email or web address] ?URL: [email or web address] ?Date: 02/13/2022 ?Prepared by: Cheral Almas ? ?Exercises ?Standing Heel Raise with Support - 1 x daily - 7 x weekly - 1 sets - 10 reps ?Sit to Stand - 1 x daily - 7 x weekly - 1 sets - 10 reps ?Standing March with Counter Support - 1 x daily - 7 x weekly - 1 sets - 10 reps ?Standing Hip Abduction with Counter Support - 1 x daily - 7 x weekly - 10 reps ? ?

## 2022-02-13 NOTE — Therapy (Signed)
?OUTPATIENT PHYSICAL THERAPY TREATMENT NOTE ? ? ?Patient Name: Connie Sharp ?MRN: [id number] ?DOB:[birth year], 81 y.o., female ?Today's Date: 02/13/2022 ? ?PCP: Pcp, No ?REFERRING PROVIDER: Heath Lark, MD ? ? PT End of Session - 02/13/22 1300   ? ? Visit Number 2   ? Number of Visits 12   ? Date for PT Re-Evaluation 03/18/22   ? Authorization Type Medicare   ? PT Start Time 1300   ? PT Stop Time 1355   ? PT Time Calculation (min) 55 min   ? Equipment Utilized During Treatment Gait belt   ? Activity Tolerance Patient tolerated treatment well   ? Behavior During Therapy [facility] for tasks assessed/performed   ? ?  ?  ? ?  ? ? ?Past Medical History:  ?Diagnosis Date  ? Anemia   ? Atrophic vaginitis 09/29/2012  ? Brain cancer [facility])   ? metastasis  ? Degenerative, intervertebral disc, cervical   ? Diverticulosis   ? 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 GERD (gastroesophageal reflux disease)   ? Glaucoma   ? left eye  ? Hx of migraines   ? Hyperlipidemia   ? Hypertension   ? Injury of right rotator cuff   ? Kidney stones   ? 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 Migraine   ? Osteoporosis 10/18/2011  ? Ovarian cancer ([place])   ? Thyroid disease   ? hypothyroid  ? ?Past Surgical History:  ?Procedure Laterality Date  ? ABDOMINAL HYSTERECTOMY    ? 1987  ? BILATERAL SALPINGOOPHORECTOMY    ? 07-22-2017  ? CHOLECYSTECTOMY  08/02/2017  ? COLONOSCOPY  08/2015  ? EYE SURGERY Left 2004  ? Left eye wrikleremoval  ? NOSE SURGERY  09/05/2014  ? Fractured Nose   ? Plastic reconstructive     ? Scar tissue left breast at age 62  ? TONSILLECTOMY AND ADENOIDECTOMY    ? ?Patient Active Problem List  ? Diagnosis Date Noted  ? Ingrown toenail 02/11/2022  ? UTI (urinary tract infection) 11/01/2021  ? Other fatigue 04/10/2021  ? Dysuria 04/02/2021  ? 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 Other  constipation 03/12/2021  ? Goals of care, counseling/discussion 03/12/2021  ? 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 ? ?REFERRING DIAG: Leptomeningeal metastases (HCC),deconditioning ? ?Physical deconditioning ? ?Muscle weakness (generalized) ? ?Repeated falls ? 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 ?Physical deconditioning ? ?Muscle weakness (generalized) ? ?Repeated falls ? 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  She had abdominal paracentesis at the time; 04/28/2017 - 06/10/2017 Chemotherapy-3 cycles of neoadjuvant chemo with carboplatin and paclitaxel in [place]; 07/22/2017- exploratory laparotomy and interval cytoreductive surgery. 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 Per pt, she has been given until August to live. Also has Glaucoma, and thyroid disease and is a significant fall risk. ? ?PRECAUTIONS: Fall risk,Brain Cancer , Glaucoma, Thyroid disease ? ?SUBJECTIVE: I am trying to be aware of how I walk, and it feels good.  ? ?PAIN:  ?Are you having pain? Yes: NPRS scale: 7/10 ?Pain location: left neck and head ?Pain description: sharp ?Aggravating factors: turning to the left ?Relieving  factors: OTC medication ? ? ? ?  ?  ?  ?  ?  ?TODAY'S TREATMENT  ?Nustep seat 5, UE 10, Lev 4 x 4 min ?Sit to stand high table 2 x 5 ?Marching with hands on bar to just finger tips\ ?Step onto ax with hands then fingertips x 15 ea ?Heel raises x 20 with light hand touch on    bars ?Semi tandem stance x 1 ea no hands ?Sidestepping in bars no hands but CGA of PT x 4 lengths ?Standing hip abduction x 10 ea with hand hold ?Nu step at end x 5 min ?  ?PATIENT EDUCATION:  ?Education details: gait training  with SBQC ?Person educated: Patient ?Education method: Demonstration ?Education comprehension: returned demonstration ?  ?  ?HOME EXERCISE PROGRAM: ?Not given ?  ?ASSESSMENT: ?  ?CLINICAL IMPRESSION: ?Therapy consisted of sitting and standing strengthening and balance activities. Pt loved the Nustep and did very well.  She had several slight LOB with standing activities easily corrected by PT and pt. Pt felt better after treatment, especially her feet that did not feel as numb. ?  ?  ?OBJECTIVE IMPAIRMENTS Abnormal gait, decreased activity tolerance, decreased balance, difficulty walking, and decreased strength.  ?  ?ACTIVITY LIMITATIONS cleaning, community activity, driving, and household chores .  ?  ?PERSONAL FACTORS Age and 3+ comorbidities: Leptomeningeal METS,Brain Cancer, Glaucoma, Thyroid disease  are also affecting patient's functional outcome.  ?  ?  ?REHAB POTENTIAL: Good ?  ?CLINICAL DECISION MAKING: Stable/uncomplicated ?  ?EVALUATION COMPLEXITY: Low ?  ?GOALS: ?Goals reviewed with patient? Yes ?  ?SHORT TERM GOALS: ?  ?Pt will be independent and compliant with a HEP to improve strength and balance ?Baseline: None ?Target date: 02/25/2022 ?Goal status: INITIAL ?  ?  ?LONG TERM GOALS: ?  ?Pt will perform 6 sit to stands in 30 seconds for decreased fall risk ?Baseline: 4 ?Target date: 03/18/2022 ?Goal status: INITIAL ?  ?2.  Pt will perform TUG in 17 sec or less for decreased fall risk ?Baseline: 22 seconds ?Target date: 03/18/2022 ?Goal status: INITIAL ?  ?3.  Pt will be able to maintain tandem stance bilateral for 5-10 seconds for decreased fall risk. ?Baseline: 1-2 sec ?Target date: 03/18/2022 ?Goal status: INITIAL ?  ?4.  Pt will be able to rise from a regular height chair without use of hands ?Baseline: uses hands, sometimes loses balance ?Target date: 03/18/2022 ?Goal status: INITIAL ?PLAN: ?PT FREQUENCY: 2x/week ?  ?PT DURATION: 6 weeks ?  ?PLANNED INTERVENTIONS: Therapeutic exercises, Therapeutic  activity, Neuromuscular re-education, Balance training, Gait training, Patient/Family education, Cognitive remediation, and Manual therapy ?  ?PLAN FOR NEXT SESSION: gait training with SBQC, strengthening; mini squats, sit to stand,etc, balance reeducation ?  ? ? ? ?Claris Pong, PT ?02/13/2022, 3:01 PM ? ?  ? ?

## 2022-02-19 ENCOUNTER — Encounter: Payer: Self-pay | Admitting: Rehabilitation

## 2022-02-19 ENCOUNTER — Other Ambulatory Visit: Payer: Self-pay

## 2022-02-19 ENCOUNTER — Other Ambulatory Visit: Payer: Self-pay | Admitting: Hematology and Oncology

## 2022-02-19 ENCOUNTER — Ambulatory Visit: Payer: Medicare Other | Admitting: Rehabilitation

## 2022-02-19 ENCOUNTER — Other Ambulatory Visit: Payer: Self-pay | Admitting: Radiation Therapy

## 2022-02-19 DIAGNOSIS — R262 Difficulty in walking, not elsewhere classified: Secondary | ICD-10-CM

## 2022-02-19 DIAGNOSIS — M6281 Muscle weakness (generalized): Secondary | ICD-10-CM

## 2022-02-19 DIAGNOSIS — R296 Repeated falls: Secondary | ICD-10-CM

## 2022-02-19 DIAGNOSIS — C7949 Secondary malignant neoplasm of other parts of nervous system: Secondary | ICD-10-CM

## 2022-02-19 DIAGNOSIS — C569 Malignant neoplasm of unspecified ovary: Secondary | ICD-10-CM

## 2022-02-19 DIAGNOSIS — R5381 Other malaise: Secondary | ICD-10-CM

## 2022-02-19 NOTE — Therapy (Signed)
?OUTPATIENT PHYSICAL THERAPY TREATMENT NOTE ? ? ?Patient Name: Connie Sharp ?MRN: [id number] ?DOB:May 25, 1941, 81 y.o., female ?Today's Date: 02/19/2022 ? ?PCP: Pcp, No ?REFERRING PROVIDER: Heath Lark, MD ? ? PT End of Session - 02/19/22 1351   ? ? Visit Number 3   ? Number of Visits 12   ? Date for PT Re-Evaluation 03/18/22   ? PT Start Time 1400   ? PT Stop Time 1451   ? PT Time Calculation (min) 51 min   ? Activity Tolerance Patient tolerated treatment well   ? Behavior During Therapy [facility] for tasks assessed/performed   ? ?  ?  ? ?  ? ? ?Past Medical History:  ?Diagnosis Date  ? Anemia   ? Atrophic vaginitis 09/29/2012  ? Brain cancer [facility])   ? metastasis  ? Degenerative, intervertebral disc, cervical   ? Diverticulosis   ? 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 GERD (gastroesophageal reflux disease)   ? Glaucoma   ? left eye  ? Hx of migraines   ? Hyperlipidemia   ? Hypertension   ? Injury of right rotator cuff   ? Kidney stones   ? 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 Migraine   ? Osteoporosis 10/18/2011  ? Ovarian cancer ([place])   ? Thyroid disease   ? hypothyroid  ? ?Past Surgical History:  ?Procedure Laterality Date  ? ABDOMINAL HYSTERECTOMY    ? 1987  ? BILATERAL SALPINGOOPHORECTOMY    ? 07-22-2017  ? CHOLECYSTECTOMY  08/02/2017  ? COLONOSCOPY  08/2015  ? EYE SURGERY Left 2004  ? Left eye wrikleremoval  ? NOSE SURGERY  09/05/2014  ? Fractured Nose   ? Plastic reconstructive     ? Scar tissue left breast at age 78  ? TONSILLECTOMY AND ADENOIDECTOMY    ? ?Patient Active Problem List  ? Diagnosis Date Noted  ? Ingrown toenail 02/11/2022  ? UTI (urinary tract infection) 11/01/2021  ? Other fatigue 04/10/2021  ? Dysuria 04/02/2021  ? 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 Other constipation 03/12/2021  ? Goals of care, counseling/discussion 03/12/2021  ? Malignant  neoplasm of ovary ([place]) 08/18/2019  ? 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 ? ?REFERRING DIAG: Leptomeningeal metastases (HCC),deconditioning ? ?Physical deconditioning ? ?Muscle weakness (generalized) ? ?Repeated falls ? 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 ?Physical deconditioning ? ?Muscle weakness (generalized) ? ?Repeated falls ? ?Difficulty in walking, not elsewhere classified ? ?Malignant neoplasm of ovary, unspecified laterality ([place]) ? 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  She had abdominal paracentesis at the time; 04/28/2017 - 06/10/2017 Chemotherapy-3 cycles of neoadjuvant chemo with carboplatin and paclitaxel in [place]; 07/22/2017- exploratory laparotomy and interval cytoreductive surgery. 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 Per pt, she has been given until August to live. Also has Glaucoma, and thyroid disease and is a significant fall risk. ? ?PRECAUTIONS: Fall risk,Brain Cancer , Glaucoma, Thyroid disease ? ?SUBJECTIVE: Feeling off balance today  ? ?PAIN:  ?Are you having pain? Yes: NPRS scale: 7/10 ?Pain location: left neck and head ?Pain description: sharp ?Aggravating factors: turning to the left ?Relieving factors: OTC medication ? ?  ?TODAY'S TREATMENT  ?02/19/22: ?  Nustep seat 5, UE 10, Lev 4 x 11:31mn     Completed 500 steps  ?Seated posterior shoulder rolls 2x5 ?Ankle DF 2x5 ?Sit to stand from chair with blue pad 2 x 5 minA with LOB x 1  ?All with gait belt:  ?Marching with hands on bar to just finger tipsx10  ?Step onto ax with hands then fingertips x 15 ea ?Semi tandem stance x 1 ea no hands ?Sidestepping in bars no hands but CGA of PT x 4 lengths ?Standing hip abduction x 10 ea with hand  hold ? ? ?02/13/22 ?Nustep seat 5, UE 10, Lev 4 x 4 min ?Sit to stand high table 2 x 5 ?Marching withhands on bar to just finger tips\ ?Step onto ax with hands then fingertips x 15 ea ?Heel raises x 20 with light hand touch on    bars ?Semi tandem stance x 1 ea no hands ?Sidestepping in bars no hands but CGA of PT x 4 lengths ?Standing hip abduction x 10 ea with hand hold ?Nu step at end x 5 min ?  ?PATIENT EDUCATION:  ?Education details: gait training with SBQC ?Person educated: Patient ?Education method: Demonstration ?Education comprehension: returned demonstration ?  ?  ?HOME EXERCISE PROGRAM: ?Not given ?  ?ASSESSMENT: ?  ?CLINICAL IMPRESSION: ?Pt tolerated all well.  No LOB except when entering the clinic and walking quickly using the Quad cane inappropriately.   ?  ?  ?OBJECTIVE IMPAIRMENTS Abnormal gait, decreased activity tolerance, decreased balance, difficulty walking, and decreased strength.  ?  ?ACTIVITY LIMITATIONS cleaning, community activity, driving, and household chores .  ?  ?PERSONAL FACTORS Age and 3+ comorbidities: Leptomeningeal METS,Brain Cancer, Glaucoma, Thyroid disease  are also affecting patient's functional outcome.  ?  ?  ?REHAB POTENTIAL: Good ?  ?CLINICAL DECISION MAKING: Stable/uncomplicated ?  ?EVALUATION COMPLEXITY: Low ?  ?GOALS: ?Goals reviewed with patient? Yes ?  ?SHORT TERM GOALS: ?  ?Pt will be independent and compliant with a HEP to improve strength and balance ?Baseline: None ?Target date: 02/25/2022 ?Goal status: INITIAL ?  ?  ?LONG TERM GOALS: ?  ?Pt will perform 6 sit to stands in 30 seconds for decreased fall risk ?Baseline: 4 ?Target date: 03/18/2022 ?Goal status: INITIAL ?  ?2.  Pt will perform TUG in 17 sec or less for decreased fall risk ?Baseline: 22 seconds ?Target date: 03/18/2022 ?Goal status: INITIAL ?  ?3.  Pt will be able to maintain tandem stance bilateral for 5-10 seconds for decreased fall risk. ?Baseline: 1-2 sec ?Target date: 03/18/2022 ?Goal status:  INITIAL ?  ?4.  Pt will be able to rise from a regular height chair without use of hands ?Baseline: uses hands, sometimes loses balance ?Target date: 03/18/2022 ?Goal status: INITIAL ?PLAN: ?PT FREQUENCY: 2x/week ?  ?PT DURATION: 6 weeks ?  ?PLANNED INTERVENTIONS: Therapeutic exercises, Therapeutic activity, Neuromuscular re-education, Balance training, Gait training, Patient/Family education, Cognitive remediation, and Manual therapy ?  ?PLAN FOR NEXT SESSION: gait training with SBQC, strengthening; mini squats, sit to stand,etc, balance reeducation ?  ? ? ? ?TStark Bray PT ?02/19/2022, 2:54 PM ? ?  ? ?

## 2022-02-20 ENCOUNTER — Ambulatory Visit (INDEPENDENT_AMBULATORY_CARE_PROVIDER_SITE_OTHER): Payer: Medicare Other | Admitting: Podiatry

## 2022-02-20 DIAGNOSIS — I999 Unspecified disorder of circulatory system: Secondary | ICD-10-CM

## 2022-02-20 NOTE — Progress Notes (Signed)
?Subjective:  ?Patient ID: Connie Sharp, female    DOB: December 17, 1940,  MRN: [id number] ? ?Chief Complaint  ?Patient presents with  ? Nail Problem  ?  Ingrown toenail   ? ? ?81 y.o. female presents with the above complaint.  Patient presents with bilateral medial border ingrown.  Painful to touch there is no redness or swelling.  She states is constant pain has gotten worse.  Some type of shoes makes it worse.  She would like to discuss having it removed.  She does not have any vascular problems that she is aware of.  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  She denies any other acute complaints pain scale 7 out of 10 hurts with ambulation ? ? ?Review of Systems: Negative except as noted in the HPI. Denies N/V/F/Ch. ? ?Past Medical History:  ?Diagnosis Date  ? Anemia   ? Atrophic vaginitis 09/29/2012  ? Brain cancer [facility])   ? metastasis  ? Degenerative, intervertebral disc, cervical   ? Diverticulosis   ? 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 GERD (gastroesophageal reflux disease)   ? Glaucoma   ? left eye  ? Hx of migraines   ? Hyperlipidemia   ? Hypertension   ? Injury of right rotator cuff   ? Kidney stones   ? 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 Migraine   ? Osteoporosis 10/18/2011  ? Ovarian cancer ([place])   ? Thyroid disease   ? hypothyroid  ? ? ?Current Outpatient Medications:  ?  Cholecalciferol (VITAMIN D3) 50 MCG (2000 UT) TABS, Take 1 tablet by mouth daily., Disp: , Rfl:  ?  dexamethasone (DECADRON) 1 MG tablet, TAKE 1 TABLET BY MOUTH EVERY DAY, Disp: 30 tablet, Rfl: 1 ?  docusate sodium (COLACE) 100 MG capsule, Take 100 mg by mouth 2 (two) times daily., Disp: , Rfl:  ?  doxylamine, Sleep, (UNISOM) 25 MG tablet, Take 25 mg by mouth at bedtime as needed., Disp: , Rfl:  ?  fluticasone (VERAMYST) 27.5 MCG/SPRAY nasal spray, Place 2 sprays into the nose daily., Disp: , Rfl:  ?  gabapentin (NEURONTIN) 300 MG capsule, Take 1 capsule (300 mg total) by mouth 2 (two)  times daily., Disp: 60 capsule, Rfl: 3 ?  levothyroxine (SYNTHROID) 25 MCG tablet, Take 1 tablet (25 mcg total) by mouth daily., Disp: 90 tablet, Rfl: 0 ?  LORazepam (ATIVAN) 1 MG tablet, TAKE 1 TABLET BY MOUTH EVERYDAY AT BEDTIME, Disp: 30 tablet, Rfl: 1 ?  Melatonin 10 MG TABS, Take by mouth., Disp: , Rfl:  ?  pantoprazole (PROTONIX) 40 MG tablet, Take 1 tablet (40 mg total) by mouth at bedtime., Disp: 30 tablet, Rfl: 3 ?  polyethylene glycol (MIRALAX / GLYCOLAX) 17 g packet, Take 17 g by mouth daily., Disp: , Rfl:  ?  potassium chloride SA (KLOR-CON M) 20 MEQ tablet, Take 1 tablet (20 mEq total) by mouth 2 (two) times daily., Disp: 60 tablet, Rfl: 0 ?  propranolol ER (INDERAL LA) 120 MG 24 hr capsule, TAKE 1 CAPSULE BY MOUTH EVERY DAY, Disp: 30 capsule, Rfl: 1 ?  Sennosides (SENNA) 8.6 MG CAPS, Take 2 capsules by mouth 2 (two) times daily., Disp: , Rfl:  ?  timolol (TIMOPTIC-XR) 0.25 % ophthalmic gel-forming, Place 1 drop into both eyes daily., Disp: , Rfl:  ?  traZODone (DESYREL) 50 MG tablet, Take 0.5-1 tablets (25-50 mg total) by mouth at bedtime., Disp: 30 tablet, Rfl: 0 ? ?Social History  ? ?  Tobacco Use  ?Smoking Status Former  ? Packs/day: 0.25  ? Years: 4.00  ? Pack years: 1.00  ? Types: Cigarettes  ? Quit date: 03/07/1965  ? Years since quitting: 56.9  ?Smokeless Tobacco Never  ? ? ?Allergies  ?Allergen Reactions  ? Ambien [Zolpidem Tartrate] Other (See Comments)  ?  Delirium  ? Cephalexin Nausea And Vomiting, Hives and Rash  ? Cefaclor Other (See Comments) and Hives  ?  unknown  ? Noroxin [Norfloxacin] Other (See Comments)  ?  unknown  ? Scallops [Shellfish Allergy] Nausea And Vomiting and Swelling  ? ?Objective:  ?There were no vitals filed for this visit. ?There is no height or weight on file to calculate BMI. ?Constitutional Well developed. ?Well nourished.  ?Vascular Dorsalis pedis pulses faintly palpable bilaterally. ?Posterior tibial pulses faintly palpable bilaterally. ?Capillary refill normal to  all digits.  ?No cyanosis or clubbing noted. ?Pedal hair growth not present  ?Neurologic Normal speech. ?Oriented to person, place, and time. ?Epicritic sensation to light touch grossly present bilaterally.  ?Dermatologic Painful ingrowing nail at medial nail borders of the hallux nail bilaterally. ?No other open wounds. ?No skin lesions.  ?Orthopedic: Normal joint ROM without pain or crepitus bilaterally. ?No visible deformities. ?No bony tenderness.  ? ?Radiographs: None ?Assessment:  ? ?1. Vascular abnormality   ? ?Plan:  ?Patient was evaluated and treated and all questions answered. ? ?Ingrown Nail, bilaterally ?-At this time clinically I discussed with the patient that she may have vascular flow issues and prior to taking out the ingrown I would want to assess that.  I discussed this with the patient in extensive detail she states understanding.  If the vascular flow is normal we will proceed with doing bilateral medial border ingrown.  If not I will send her to a vascular doctor.  She states understanding ? ?No follow-ups on file. ?

## 2022-02-21 ENCOUNTER — Encounter: Payer: Medicare Other | Admitting: Rehabilitation

## 2022-02-21 NOTE — Progress Notes (Signed)
Nutrition ? ?Patient attended Nutrition 101 class this afternoon with husband and family member ? ?Garon Melander B. Zenia Resides, RD, LDN ?Registered Dietitian ?[phone] ? ?

## 2022-02-26 ENCOUNTER — Ambulatory Visit: Payer: Medicare Other

## 2022-02-26 ENCOUNTER — Other Ambulatory Visit: Payer: Self-pay

## 2022-02-26 DIAGNOSIS — C7949 Secondary malignant neoplasm of other parts of nervous system: Secondary | ICD-10-CM | POA: Diagnosis not present

## 2022-02-26 DIAGNOSIS — R5381 Other malaise: Secondary | ICD-10-CM

## 2022-02-26 DIAGNOSIS — M6281 Muscle weakness (generalized): Secondary | ICD-10-CM

## 2022-02-26 DIAGNOSIS — C569 Malignant neoplasm of unspecified ovary: Secondary | ICD-10-CM

## 2022-02-26 DIAGNOSIS — R262 Difficulty in walking, not elsewhere classified: Secondary | ICD-10-CM

## 2022-02-26 DIAGNOSIS — R296 Repeated falls: Secondary | ICD-10-CM

## 2022-02-26 NOTE — Therapy (Signed)
?OUTPATIENT PHYSICAL THERAPY TREATMENT NOTE ? ? ?Patient Name: Connie Sharp ?MRN: [id number] ?DOB:1940/12/21, 81 y.o., female ?Today's Date: 02/26/2022 ? ?PCP: Pcp, No ?REFERRING PROVIDER: Heath Lark, MD ? ? PT End of Session - 02/26/22 1402   ? ? Visit Number 4   ? Number of Visits 12   ? Date for PT Re-Evaluation 03/18/22   ? Authorization Type Medicare   ? PT Start Time 7858   ? PT Stop Time 1450   ? PT Time Calculation (min) 47 min   ? Equipment Utilized During Treatment Gait belt   ? Activity Tolerance Patient tolerated treatment well   ? Behavior During Therapy [facility] for tasks assessed/performed   ? ?  ?  ? ?  ? ? ?Past Medical History:  ?Diagnosis Date  ? Anemia   ? Atrophic vaginitis 09/29/2012  ? Brain cancer [facility])   ? metastasis  ? Degenerative, intervertebral disc, cervical   ? Diverticulosis   ? 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 GERD (gastroesophageal reflux disease)   ? Glaucoma   ? left eye  ? Hx of migraines   ? Hyperlipidemia   ? Hypertension   ? Injury of right rotator cuff   ? Kidney stones   ? 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 Migraine   ? Osteoporosis 10/18/2011  ? Ovarian cancer ([place])   ? Thyroid disease   ? hypothyroid  ? ?Past Surgical History:  ?Procedure Laterality Date  ? ABDOMINAL HYSTERECTOMY    ? 1987  ? BILATERAL SALPINGOOPHORECTOMY    ? 07-22-2017  ? CHOLECYSTECTOMY  08/02/2017  ? COLONOSCOPY  08/2015  ? EYE SURGERY Left 2004  ? Left eye wrikleremoval  ? NOSE SURGERY  09/05/2014  ? Fractured Nose   ? Plastic reconstructive     ? Scar tissue left breast at age 41  ? TONSILLECTOMY AND ADENOIDECTOMY    ? ?Patient Active Problem List  ? Diagnosis Date Noted  ? Ingrown toenail 02/11/2022  ? UTI (urinary tract infection) 11/01/2021  ? Other fatigue 04/10/2021  ? Dysuria 04/02/2021  ? 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 Other  constipation 03/12/2021  ? Goals of care, counseling/discussion 03/12/2021  ? 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 ? ?REFERRING DIAG: Leptomeningeal metastases (HCC),deconditioning ? ?Physical deconditioning ? ?Muscle weakness (generalized) ? ?Repeated falls ? 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 ?Physical deconditioning ? ?Muscle weakness (generalized) ? ?Repeated falls ? ?Difficulty in walking, not elsewhere classified ? ?Malignant neoplasm of ovary, unspecified laterality ([place]) ? 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  She had abdominal paracentesis at the time; 04/28/2017 - 06/10/2017 Chemotherapy-3 cycles of neoadjuvant chemo with carboplatin and paclitaxel in [place]; 07/22/2017- exploratory laparotomy and interval cytoreductive surgery. 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 Per pt, she has been given until August to live. Also has Glaucoma, and thyroid disease and is a significant fall risk. ? ?PRECAUTIONS: Fall risk,Brain Cancer , Glaucoma, Thyroid disease ? ?SUBJECTIVE: I'm OK today but I am tired. I felt good after last visit. ? ?PAIN:  ?Are you having pain? Yes ?PAIN:  ?Are you having pain?  Yes ?NPRS scale: 9/10 leg, 8-9/10 left neck ?Pain location: right Leg, left neck ?Pain orientation: Lower  ?PAIN TYPE: burning ?Pain description: constant  ?Aggravating factors: bumped leg ?Relieving factors: ointment on leg ? ? ?  ?TODAY'S TREATMENT  ?Sit to stand from lower treatment table 2 x 5 with arms crossed ?Nu step seat 6, UE 10, Lev 4 x 10 min ?Mini squats holding bars x 10 ?Heel raises x 10 hand hold, 10 finger touch ?Marching hands with fingertips on bar  2 x10 ?Tandem stance x  2 ea ?Step ups on ax pad x 10 ea finger touch ?Sidestepping 4 lengths of bar with no hands ?SLS x 2 bilaterally; ? ?02/19/22: ?Nustep seat 5, UE 10, Lev 4 x 11:75mn     Completed 500 steps  ?Seated posterior shoulder rolls 2x5 ?Ankle DF 2x5 ?Sit to stand from chair with blue pad 2 x 5 minA with LOB x 1  ?All with gait belt:  ?Marching with hands on bar to just finger tipsx10  ?Step onto ax with hands then fingertips x 15 ea ?Semi tandem stance x 1 ea no hands ?Sidestepping in bars no hands but CGA of PT x 4 lengths ?Standing hip abduction x 10 ea with hand hold ? ? ?02/13/22 ?Nustep seat 5, UE 10, Lev 4 x 4 min ?Sit to stand high table 2 x 5 ?Marching withhands on bar to just finger tips_0 ?Step onto ax with hands then fingertips x 15 ea ?Heel raises x 20 with light hand touch on    bars ?Semi tandem stance x 1 ea no hands ?Sidestepping in bars no hands but CGA of PT x 4 lengths ?Standing hip abduction x 10 ea with hand hold ?Nu step at end x 5 min ?  ?PATIENT EDUCATION:  ?Education details: gait training with SBQC ?Person educated: Patient ?Education method: Demonstration ?Education comprehension: returned demonstration ?  ?  ?HOME EXERCISE PROGRAM: ?Mini squats, heel raises, marches ?  ?ASSESSMENT: ?  ?CLINICAL IMPRESSION: ? ?Pt did exceptionally well today.  She demonstrates improved confidence and improved balance. She was able to maintain tandem stance and SLS for approximately 5 secs ea  and is using less wt through hands with all exercises ?  ?  ?OBJECTIVE IMPAIRMENTS Abnormal gait, decreased activity tolerance, decreased balance, difficulty walking, and decreased strength.  ?  ?ACTIVITY LIMITATIONS cleaning, community activity, driving, and household chores .  ?  ?PERSONAL FACTORS Age and 3+ comorbidities: Leptomeningeal METS,Brain Cancer, Glaucoma, Thyroid disease  are also affecting patient's functional outcome.  ?  ?  ?REHAB POTENTIAL: Good ?  ?CLINICAL DECISION MAKING: Stable/uncomplicated ?  ?EVALUATION  COMPLEXITY: Low ?  ?GOALS: ?Goals reviewed with patient? Yes ?  ?SHORT TERM GOALS: ?  ?Pt will be independent and compliant with a HEP to improve strength and balance ?Baseline: None ?Target date: 02/25/2022 ?Goal status: INITIAL ?  ?  ?LONG TERM GOALS: ?  ?Pt will perform 6 sit to stands in 30 seconds for decreased fall risk ?Baseline: 4 ?Target date: 03/18/2022 ?Goal status: INITIAL ?  ?2.  Pt will perform TUG in 17 sec or less for decreased fall risk ?Baseline: 22 seconds ?Target date: 03/18/2022 ?Goal status: INITIAL ?  ?3.  Pt will be able to maintain tandem stance bilateral for 5-10 seconds for decreased fall risk. ?Baseline: 1-2 sec ?Target date: 03/18/2022 ?Goal status: INITIAL ?  ?4.  Pt will be able to rise from a regular height chair without use of hands ?Baseline: uses hands,  sometimes loses balance ?Target date: 03/18/2022 ?Goal status: INITIAL ?PLAN: ?PT FREQUENCY: 2x/week ?  ?PT DURATION: 6 weeks ?  ?PLANNED INTERVENTIONS: Therapeutic exercises, Therapeutic activity, Neuromuscular re-education, Balance training, Gait training, Patient/Family education, Cognitive remediation, and Manual therapy ?  ?PLAN FOR NEXT SESSION: gait training with SBQC, strengthening; mini squats, sit to stand,etc, balance reeducation ?  ? ? ? ?Claris Pong, PT ?02/26/2022, 2:59 PM ? ?  ? ?

## 2022-02-28 ENCOUNTER — Ambulatory Visit: Payer: Medicare Other

## 2022-02-28 DIAGNOSIS — R5381 Other malaise: Secondary | ICD-10-CM

## 2022-02-28 DIAGNOSIS — R262 Difficulty in walking, not elsewhere classified: Secondary | ICD-10-CM

## 2022-02-28 DIAGNOSIS — C569 Malignant neoplasm of unspecified ovary: Secondary | ICD-10-CM

## 2022-02-28 DIAGNOSIS — M6281 Muscle weakness (generalized): Secondary | ICD-10-CM

## 2022-02-28 DIAGNOSIS — R296 Repeated falls: Secondary | ICD-10-CM

## 2022-02-28 DIAGNOSIS — C7949 Secondary malignant neoplasm of other parts of nervous system: Secondary | ICD-10-CM | POA: Diagnosis not present

## 2022-02-28 NOTE — Therapy (Signed)
?OUTPATIENT PHYSICAL THERAPY TREATMENT NOTE ? ? ?Patient Name: Connie Sharp ?MRN: [id number] ?DOB:July 08, 1941, 81 y.o., female ?Today's Date: 02/28/2022 ? ?PCP: Pcp, No ?REFERRING PROVIDER: Heath Lark, MD ? ? PT End of Session - 02/28/22 1102   ? ? Visit Number 5   ? Number of Visits 12   ? Date for PT Re-Evaluation 03/18/22   ? Authorization Type Medicare   ? PT Start Time 1103   ? PT Stop Time 1155   ? PT Time Calculation (min) 52 min   ? Equipment Utilized During Treatment Gait belt   ? Activity Tolerance Patient tolerated treatment well   ? Behavior During Therapy [facility] for tasks assessed/performed   ? ?  ?  ? ?  ? ? ?Past Medical History:  ?Diagnosis Date  ? Anemia   ? Atrophic vaginitis 09/29/2012  ? Brain cancer [facility])   ? metastasis  ? Degenerative, intervertebral disc, cervical   ? Diverticulosis   ? 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 GERD (gastroesophageal reflux disease)   ? Glaucoma   ? left eye  ? Hx of migraines   ? Hyperlipidemia   ? Hypertension   ? Injury of right rotator cuff   ? Kidney stones   ? 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 Migraine   ? Osteoporosis 10/18/2011  ? Ovarian cancer ([place])   ? Thyroid disease   ? hypothyroid  ? ?Past Surgical History:  ?Procedure Laterality Date  ? ABDOMINAL HYSTERECTOMY    ? 1987  ? BILATERAL SALPINGOOPHORECTOMY    ? 07-22-2017  ? CHOLECYSTECTOMY  08/02/2017  ? COLONOSCOPY  08/2015  ? EYE SURGERY Left 2004  ? Left eye wrikleremoval  ? NOSE SURGERY  09/05/2014  ? Fractured Nose   ? Plastic reconstructive     ? Scar tissue left breast at age 31  ? TONSILLECTOMY AND ADENOIDECTOMY    ? ?Patient Active Problem List  ? Diagnosis Date Noted  ? Ingrown toenail 02/11/2022  ? UTI (urinary tract infection) 11/01/2021  ? Other fatigue 04/10/2021  ? Dysuria 04/02/2021  ? 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 Other  constipation 03/12/2021  ? Goals of care, counseling/discussion 03/12/2021  ? 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 ? ?REFERRING DIAG: Leptomeningeal metastases (HCC),deconditioning ? ?Physical deconditioning ? ?Muscle weakness (generalized) ? ?Repeated falls ? ?THERAPY DIAG:  ?Leptomeningeal metastases ? ?Physical deconditioning ? ?Muscle weakness (generalized) ? ?Repeated falls ? ?Difficulty in walking, not elsewhere classified ? ?Malignant neoplasm of ovary, unspecified laterality ([place]) ? 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  She had abdominal paracentesis at the time; 04/28/2017 - 06/10/2017 Chemotherapy-3 cycles of neoadjuvant chemo with carboplatin and paclitaxel in [place]; 07/22/2017- exploratory laparotomy and interval cytoreductive surgery. 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 Per pt, she has been given until August to live. Also has Glaucoma, and thyroid disease and is a significant fall risk. ? ?PRECAUTIONS: Fall risk,Brain Cancer , Glaucoma, Thyroid disease ? ?SUBJECTIVE:I felt good after last visit. I am hoping to do some more of the balance today ? ?PAIN:  ?Are you having pain? Yes ?PAIN:  ?Are you  having pain? Yes ?NPRS scale: 8-9/10 left neck ?Pain location: right Leg, left neck ?Pain orientation: Lower  ?PAIN TYPE: burning ?Pain description: constant  ?Aggravating factors: bumped leg ?Relieving factors: ointment on leg ? ? ?  ?TODAY'S TREATMENT ?02/28/2022 ?Nu step x 10 min seat 6, UE 9, lev 5  482 steps ?Mini squats holding bars x 10 ?Heel raises x 10 hand hold, 10 finger touch ?Marching hands with fingertips on bar  2 x10 ?Sidestepping in bars x 4 lengths;no hand hold but CGA of  PT ?Tandem stance Bilaterally x 3 ?Heel raises x 18 no hand hold but CGA of PT ?Airex beam 6 lengths forward and 6 lengths sideways CGA of PT light hand touch pt ?Step onto airex right, and then left foot first x 10 no hand hold, CGA PT ?Ambulated 250 feet without cane but with CGA of pt. Focus on heel strike, toe off and arm swing. ?02/26/2022  ?Sit to stand from lower treatment table 2 x 5 with arms crossed ?Nu step seat 6, UE 10, Lev 5 x 10 min ?Mini squats holding bars x 10 ?Heel raises x 10 hand hold, 10 finger touch ?Marching hands with fingertips on bar  2 x10 ?Tandem stance x 2 ea ?Step ups on ax pad x 10 ea finger touch ?Sidestepping 4 lengths of bar with no hands ?SLS x 2 bilaterally; ? ?02/19/22: ?Nustep seat 5, UE 10, Lev 4 x 11:2mn     Completed 500 steps  ?Seated posterior shoulder rolls 2x5 ?Ankle DF 2x5 ?Sit to stand from chair with blue pad 2 x 5 minA with LOB x 1  ?All with gait belt:  ?Marching with hands on bar to just finger tipsx10  ?Step onto ax with hands then fingertips x 15 ea ?Semi tandem stance x 1 ea no hands ?Sidestepping in bars no hands but CGA of PT x 4 lengths ?Standing hip abduction x 10 ea with hand hold ? ? ?02/13/22 ?Nustep seat 5, UE 10, Lev 4 x 4 min ?Sit to stand high table 2 x 5 ?Marching withhands on bar to just finger tips_0 ?Step onto ax with hands then fingertips x 15 ea ?Heel raises x 20 with light hand touch on    bars ?Semi tandem stance x 1 ea no hands ?Sidestepping in bars no hands but CGA of PT x 4 lengths ?Standing hip abduction x 10 ea with hand hold ?Nu step at end x 5 min ?  ?PATIENT EDUCATION:  ?Education details: gait training with SBQC ?Person educated: Patient ?Education method: Demonstration ?Education comprehension: returned demonstration ?  ?  ?HOME EXERCISE PROGRAM: ?Mini squats, heel raises, marches ?  ?ASSESSMENT: ?  ?CLINICAL IMPRESSION: ? ?Pt did exceptionally well today.  She continues to demonstrate improved confidence and improved balance. She was  able to maintain tandem stance bilaterally for a count of 20 several times each side.She was able to do steps onto airex without hands and without LOB.  It appeared easier with left foot up than right . She is using less wt through hands with all exercises ?  ?  ?OBJECTIVE IMPAIRMENTS Abnormal gait, decreased activity tolerance, decreased balance, difficulty walking, and decreased strength.  ?  ?ACTIVITY LIMITATIONS cleaning, community activity, driving, and household chores .  ?  ?PERSONAL FACTORS Age and 3+ comorbidities: Leptomeningeal METS,Brain Cancer, Glaucoma, Thyroid disease  are also affecting patient's functional outcome.  ?  ?  ?REHAB POTENTIAL: Good ?  ?CLINICAL DECISION MAKING: Stable/uncomplicated ?  ?EVALUATION COMPLEXITY:  Low ?  ?GOALS: ?Goals reviewed with patient? Yes ?  ?SHORT TERM GOALS: ?  ?Pt will be independent and compliant with a HEP to improve strength and balance ?Baseline: None ?Target date: 02/25/2022 ?Goal status: INITIAL ?  ?  ?LONG TERM GOALS: ?  ?Pt will perform 6 sit to stands in 30 seconds for decreased fall risk ?Baseline: 4 ?Target date: 03/18/2022 ?Goal status: INITIAL ?  ?2.  Pt will perform TUG in 17 sec or less for decreased fall risk ?Baseline: 22 seconds ?Target date: 03/18/2022 ?Goal status: INITIAL ?  ?3.  Pt will be able to maintain tandem stance bilateral for 5-10 seconds for decreased fall risk. ?Baseline: 1-2 sec ?Target date: 03/18/2022 ?Goal status: INITIAL ?  ?4.  Pt will be able to rise from a regular height chair without use of hands ?Baseline: uses hands, sometimes loses balance ?Target date: 03/18/2022 ?Goal status: INITIAL ?PLAN: ?PT FREQUENCY: 2x/week ?  ?PT DURATION: 6 weeks ?  ?PLANNED INTERVENTIONS: Therapeutic exercises, Therapeutic activity, Neuromuscular re-education, Balance training, Gait training, Patient/Family education, Cognitive remediation, and Manual therapy ?  ?PLAN FOR NEXT SESSION: gait training with SBQC, strengthening; mini squats, sit to  stand,etc, balance reeducation ?  ? ? ? ?Claris Pong, PT ?02/28/2022, 12:11 PM ? ?  ? ?

## 2022-03-05 ENCOUNTER — Ambulatory Visit: Payer: Medicare Other | Attending: Hematology and Oncology

## 2022-03-05 DIAGNOSIS — C569 Malignant neoplasm of unspecified ovary: Secondary | ICD-10-CM | POA: Insufficient documentation

## 2022-03-05 DIAGNOSIS — M6281 Muscle weakness (generalized): Secondary | ICD-10-CM | POA: Diagnosis present

## 2022-03-05 DIAGNOSIS — R262 Difficulty in walking, not elsewhere classified: Secondary | ICD-10-CM | POA: Insufficient documentation

## 2022-03-05 DIAGNOSIS — R296 Repeated falls: Secondary | ICD-10-CM | POA: Insufficient documentation

## 2022-03-05 DIAGNOSIS — R5381 Other malaise: Secondary | ICD-10-CM | POA: Diagnosis present

## 2022-03-05 NOTE — Therapy (Signed)
?OUTPATIENT PHYSICAL THERAPY TREATMENT NOTE ? ? ?Patient Name: Connie Sharp ?MRN: [id number] ?DOB:[birth year], 81 y.o., female ?Today's Date: 03/05/2022 ? ?PCP: Pcp, No ?REFERRING PROVIDER: Heath Lark, MD ? ? PT End of Session - 03/05/22 1146   ? ? Visit Number 6   ? Number of Visits 12   ? Date for PT Re-Evaluation 03/18/22   ? Authorization Type Medicare   ? PT Start Time 1111   ? PT Stop Time 1200   ? PT Time Calculation (min) 49 min   ? Equipment Utilized During Treatment Gait belt   ? Activity Tolerance Patient tolerated treatment well   ? Behavior During Therapy [facility] for tasks assessed/performed   ? ?  ?  ? ?  ? ? ? ?Past Medical History:  ?Diagnosis Date  ? Anemia   ? Atrophic vaginitis 09/29/2012  ? Brain cancer [facility])   ? metastasis  ? Degenerative, intervertebral disc, cervical   ? Diverticulosis   ? 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 GERD (gastroesophageal reflux disease)   ? Glaucoma   ? left eye  ? Hx of migraines   ? Hyperlipidemia   ? Hypertension   ? Injury of right rotator cuff   ? Kidney stones   ? 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 Migraine   ? Osteoporosis 10/18/2011  ? Ovarian cancer ([place])   ? Thyroid disease   ? hypothyroid  ? ?Past Surgical History:  ?Procedure Laterality Date  ? ABDOMINAL HYSTERECTOMY    ? 1987  ? BILATERAL SALPINGOOPHORECTOMY    ? 07-22-2017  ? CHOLECYSTECTOMY  08/02/2017  ? COLONOSCOPY  08/2015  ? EYE SURGERY Left 2004  ? Left eye wrikleremoval  ? NOSE SURGERY  09/05/2014  ? Fractured Nose   ? Plastic reconstructive     ? Scar tissue left breast at age 44  ? TONSILLECTOMY AND ADENOIDECTOMY    ? ?Patient Active Problem List  ? Diagnosis Date Noted  ? Ingrown toenail 02/11/2022  ? UTI (urinary tract infection) 11/01/2021  ? Other fatigue 04/10/2021  ? Dysuria 04/02/2021  ? 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 Other constipation  03/12/2021  ? Goals of care, counseling/discussion 03/12/2021  ? 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 ? ?REFERRING DIAG: Leptomeningeal metastases (HCC),deconditioning ? ?Physical deconditioning ? ?Muscle weakness (generalized) ? ?Repeated falls ? ?THERAPY DIAG:  ?Physical deconditioning ? ?Muscle weakness (generalized) ? ?Repeated falls ? ?Difficulty in walking, not elsewhere classified ? ?Malignant neoplasm of ovary, unspecified laterality ([place]) ? 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  She had abdominal paracentesis at the time; 04/28/2017 - 06/10/2017 Chemotherapy-3 cycles of neoadjuvant chemo with carboplatin and paclitaxel in [place]; 07/22/2017- exploratory laparotomy and interval cytoreductive surgery. 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 Per pt, she has been given until August to live. Also has Glaucoma, and thyroid disease and is a significant fall risk. ? ?PRECAUTIONS: Fall risk,Brain Cancer , Glaucoma, Thyroid disease ? ?SUBJECTIVE:I felt good after last visit. I need a longer nap the day after I am here. ? ?PAIN:  ?Are you having pain? Yes ?PAIN:  ?Are you having pain? Yes ?  NPRS scale: 8-9/10 left neck ?Pain location:  left neck ?Pain orientation: Lower ?PAIN TYPE: burning ?Pain description: constant  ?Aggravating factors:laying in bed bothers neck ?Relieving factors: night meds ? ? ?  ?TODAY'S TREATMENT ? ?03/05/2022 ? ?Mini squats holding bars x 10 ?Heel raises x 10 hand hold, 10 finger touch ?Airex beam 6 lengths forward and 6 lengths sideways CGA of PT light hand touch pt ?Step onto airex right, and then left foot first  forward and lateral x 5 no hand hold, CGA PT ?Nu step x 6 min  seat 6, UE 9, lev 5   247 steps ?Obstacle course:serpentine around 6 cones, stepped up on 2 wobble boards, then onto 6 in step and off. CGA of PT with occasional Min A for LOB ?Ambulated  250 feet with CGA of PT but no AD. Practiced relaxing arms at sides and heel strike/toe off ? ?02/28/2022 ?Nu step x 10 min seat 6, UE 9, lev 5  482 steps ?Mini squats holding bars x 10 ?Heel raises x 10 hand hold, 10 finger touch ?Marching hands with fingertips on bar  2 x10 ?Sidestepping in bars x 4 lengths;no hand hold but CGA of PT ?Tandem stance Bilaterally x 3 ?Heel raises x 18 no hand hold but CGA of PT ?Airex beam 6 lengths forward and 6 lengths sideways CGA of PT light hand touch pt ?Step onto airex right, and then left foot first x 10 no hand hold, CGA PT ?Ambulated 250 feet without cane but with CGA of pt. Focus on heel strike, toe off and arm swing. ? ?02/26/2022  ?Sit to stand from lower treatment table 2 x 5 with arms crossed ?Nu step seat 6, UE 10, Lev 5 x 10 min ?Mini squats holding bars x 10 ?Heel raises x 10 hand hold, 10 finger touch ?Marching hands with fingertips on bar  2 x10 ?Tandem stance x 2 ea ?Step ups on ax pad x 10 ea finger touch ?Sidestepping 4 lengths of bar with no hands ?SLS x 2 bilaterally; ? ? ?  ?PATIENT EDUCATION:  ?Education details: gait training with SBQC ?Person educated: Patient ?Education method: Demonstration ?Education comprehension: returned demonstration ?  ?  ?HOME EXERCISE PROGRAM: ?Mini squats, heel raises, marches ?  ?ASSESSMENT: ?  ?CLINICAL IMPRESSION: ?Pt late today. She continues to do well with improving balance as noted with being able to step onto airex forward and laterally without hands touching bar. She was less confident with doing the obstacle course especially the knobby balance board, and  6 in step so required occasional Min A for LOB.  Improved gait on straight away after cueing by PT to lengthen stride and relax arms. ? ?  ?  ?OBJECTIVE IMPAIRMENTS Abnormal gait,  decreased activity tolerance, decreased balance, difficulty walking, and decreased strength.  ?  ?ACTIVITY LIMITATIONS cleaning, community activity, driving, and household chores .  ?  ?PERSONAL FACTORS Age and 3+ comorbidities: Leptomeningeal METS,Brain Cancer, Glaucoma, Thyroid disease  are also affecting patient's functional outcome.  ?  ?  ?REHAB POTENTIAL: Good ?  ?CLINICAL DECISION MAKING: Stable/uncomplicated ?  ?EVALUATION COMPLEXITY: Low ?  ?GOALS: ?Goals reviewed with patient? Yes ?  ?SHORT TERM GOALS: ?  ?Pt will be independent and compliant with a HEP to improve strength and balance ?Baseline: None ?Target date: 02/25/2022 ?Goal status: INITIAL ?  ?  ?LONG TERM GOALS: ?  ?Pt will perform 6 sit to stands in 30 seconds for decreased fall risk ?Baseline: 4 ?Target date: 03/18/2022 ?  Goal status: INITIAL ?  ?2.  Pt will perform TUG in 17 sec or less for decreased fall risk ?Baseline: 22 seconds ?Target date: 03/18/2022 ?Goal status: INITIAL ?  ?3.  Pt will be able to maintain tandem stance bilateral for 5-10 seconds for decreased fall risk. ?Baseline: 1-2 sec ?Target date: 03/18/2022 ?Goal status: INITIAL ?  ?4.  Pt will be able to rise from a regular height chair without use of hands ?Baseline: uses hands, sometimes loses balance ?Target date: 03/18/2022 ?Goal status: INITIAL ?PLAN: ?PT FREQUENCY: 2x/week ?  ?PT DURATION: 6 weeks ?  ?PLANNED INTERVENTIONS: Therapeutic exercises, Therapeutic activity, Neuromuscular re-education, Balance training, Gait training, Patient/Family education, Cognitive remediation, and Manual therapy ?  ?PLAN FOR NEXT SESSION: gait training with SBQC, strengthening; mini squats, sit to stand,etc, balance reeducation ?  ? ? ? ?Claris Pong, PT ?03/05/2022, 12:12 PM ? ?  ? ?

## 2022-03-06 ENCOUNTER — Ambulatory Visit (HOSPITAL_COMMUNITY)
Admission: RE | Admit: 2022-03-06 | Discharge: 2022-03-06 | Disposition: A | Discharge status: Home / Self Care | Payer: Medicare Other | Source: Ambulatory Visit | Attending: Podiatry | Admitting: Podiatry

## 2022-03-06 DIAGNOSIS — I999 Unspecified disorder of circulatory system: Secondary | ICD-10-CM | POA: Diagnosis present

## 2022-03-07 ENCOUNTER — Ambulatory Visit: Payer: Medicare Other

## 2022-03-07 DIAGNOSIS — C569 Malignant neoplasm of unspecified ovary: Secondary | ICD-10-CM

## 2022-03-07 DIAGNOSIS — M6281 Muscle weakness (generalized): Secondary | ICD-10-CM

## 2022-03-07 DIAGNOSIS — R5381 Other malaise: Secondary | ICD-10-CM | POA: Diagnosis not present

## 2022-03-07 DIAGNOSIS — R296 Repeated falls: Secondary | ICD-10-CM

## 2022-03-07 DIAGNOSIS — R262 Difficulty in walking, not elsewhere classified: Secondary | ICD-10-CM

## 2022-03-07 NOTE — Therapy (Signed)
?OUTPATIENT PHYSICAL THERAPY TREATMENT NOTE ? ? ?Patient Name: Connie Sharp ?MRN: [id number] ?DOB:01/12/1941, 81 y.o., female ?Today's Date: 03/07/2022 ? ?PCP: Pcp, No ?REFERRING PROVIDER: Heath Lark, MD ? ? PT End of Session - 03/07/22 1004   ? ? Visit Number 7   ? Number of Visits 12   ? Date for PT Re-Evaluation 03/18/22   ? Authorization Type Medicare   ? PT Start Time 1004   ? PT Stop Time 1055   ? PT Time Calculation (min) 51 min   ? ?  ?  ? ?  ? ? ? ?Past Medical History:  ?Diagnosis Date  ? Anemia   ? Atrophic vaginitis 09/29/2012  ? Brain cancer [facility])   ? metastasis  ? Degenerative, intervertebral disc, cervical   ? Diverticulosis   ? 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 GERD (gastroesophageal reflux disease)   ? Glaucoma   ? left eye  ? Hx of migraines   ? Hyperlipidemia   ? Hypertension   ? Injury of right rotator cuff   ? Kidney stones   ? 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 Migraine   ? Osteoporosis 10/18/2011  ? Ovarian cancer ([place])   ? Thyroid disease   ? hypothyroid  ? ?Past Surgical History:  ?Procedure Laterality Date  ? ABDOMINAL HYSTERECTOMY    ? 1987  ? BILATERAL SALPINGOOPHORECTOMY    ? 07-22-2017  ? CHOLECYSTECTOMY  08/02/2017  ? COLONOSCOPY  08/2015  ? EYE SURGERY Left 2004  ? Left eye wrikleremoval  ? NOSE SURGERY  09/05/2014  ? Fractured Nose   ? Plastic reconstructive     ? Scar tissue left breast at age 66  ? TONSILLECTOMY AND ADENOIDECTOMY    ? ?Patient Active Problem List  ? Diagnosis Date Noted  ? Ingrown toenail 02/11/2022  ? UTI (urinary tract infection) 11/01/2021  ? Other fatigue 04/10/2021  ? Dysuria 04/02/2021  ? 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 Other constipation 03/12/2021  ? Goals of care, counseling/discussion 03/12/2021  ? 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 ? ?REFERRING  DIAG: Leptomeningeal metastases (HCC),deconditioning ? ?Physical deconditioning ? ?Muscle weakness (generalized) ? ?Repeated falls ? ?THERAPY DIAG:  ?Physical deconditioning ? ?Muscle weakness (generalized) ? ?Repeated falls ? ?Difficulty in walking, not elsewhere classified ? ?Malignant neoplasm of ovary, unspecified laterality ([place]) ? 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  She had abdominal paracentesis at the time; 04/28/2017 - 06/10/2017 Chemotherapy-3 cycles of neoadjuvant chemo with carboplatin and paclitaxel in [place]; 07/22/2017- exploratory laparotomy and interval cytoreductive surgery. 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 Per pt, she has been given until August to live. Also has Glaucoma, and thyroid disease and is a significant fall risk. ? ?PRECAUTIONS: Fall risk,Brain Cancer , Glaucoma, Thyroid disease ? ?SUBJECTIVE:I couldn't get out of bed yesterday after I was here the day before, and it happened the time before that also. Yesterday morning I was dizzy and off balance when I got up and I felt a little nauseas. I slept all day yesterday. I was fine when I got up this am.  I have company coming tomorrow  and I want to make sure I feel good. I am concerned this is related to my PT and I am not sure if I should continue ? ?PAIN:  ?Are you having pain? Yes ?PAIN:  ?Are you having pain? Yes ?NPRS scale: 8-9/10 left neck ?Pain location:  left neck ?Pain orientation: Lower ?PAIN TYPE: burning ?Pain description: constant  ?Aggravating factors:laying in bed bothers neck ?Relieving factors: night meds ? ? ?  ?TODAY'S TREATMENT ?03/07/2022 ?Sit to stand x 5 from treatment table ?Ambulated 225 ft with CGA and 2 short standing rest breaks with  emphasis on heel strike and toe off, sitting rest, water ?Heel raises x 10 light touch, marching x 10 light touch ?Sidestepping in bars x 4 lengths no hand hold, sitting rest, water ?Ambulated 75 feet with CGA; sitting rest, water ?Assisted pt to car ? ?03/05/2022 ? ?Mini squats holding bars x 10 ?Heel raises x 10 hand hold, 10 finger touch ?Airex beam 6 lengths forward and 6 lengths sideways CGA of PT light hand touch pt ?Step onto airex right, and then left foot first  forward and lateral x 5 no hand hold, CGA PT ?Nu step x 6 min seat 6, UE 9, lev 5   247 steps ?Obstacle course:serpentine around 6 cones, stepped up on 2 wobble boards, then onto 6 in step and off. CGA of PT with occasional Min A for LOB ?Ambulated  250 feet with CGA of PT but no AD. Practiced relaxing arms at sides and heel strike/toe off ? ?02/28/2022 ?Nu step x 10 min seat 6, UE 9, lev 5  482 steps ?Mini squats holding bars x 10 ?Heel raises x 10 hand hold, 10 finger touch ?Marching hands with fingertips on bar  2 x10 ?Sidestepping in bars x 4 lengths;no hand hold but CGA of PT ?Tandem stance Bilaterally x 3 ?Heel raises x 18 no hand hold but CGA of PT ?Airex beam 6 lengths forward and 6 lengths sideways CGA of PT light hand touch pt ?Step onto airex right, and then left foot first x 10 no hand hold, CGA PT ?Ambulated 250 feet without cane but with CGA of pt. Focus on heel strike, toe off and arm swing. ? ?02/26/2022  ?Sit to stand from lower treatment table 2 x 5 with arms crossed ?Nu step seat 6, UE 10, Lev 5 x 10 min ?Mini squats holding bars x 10 ?Heel raises x 10 hand hold, 10 finger touch ?Marching hands with fingertips on bar  2 x10 ?Tandem stance x 2 ea ?Step ups on ax pad x 10 ea finger touch ?Sidestepping 4 lengths of bar with no hands ?SLS x 2 bilaterally; ? ? ?  ?PATIENT EDUCATION:  ?Education details: gait training with SBQC ?Person educated: Patient ?Education method: Demonstration ?Education comprehension: returned demonstration ?  ?   ?HOME EXERCISE PROGRAM: ?Mini squats, heel raises, marches ?  ?ASSESSMENT: ?  ?CLINICAL IMPRESSION: ?Pt reported 2 episodes of feeling dizzy in the morning and feeling like she had to sleep all day following the last 2 PT treatments.  She had concerns that PT was the reason why, and she wanted to feel good tomorrow because she has company in town.  We significantly decreased what we did in therapy and allowed pt more rest breaks.  She had more difficulty today with the few balance activities that we did. With our last trial of walking she was not walking well and was using a step to gait pattern. Despite encouragement to  walk with normal gait she could not get into her normal step through pattern.  She did experience a LOB that required PT mod. Assist. She was given rest and then assisted to her car.  She was able to use step through gait going to car and had no further losses of balance. ? ?  ?  ?OBJECTIVE IMPAIRMENTS Abnormal gait, decreased activity tolerance, decreased balance, difficulty walking, and decreased strength.  ?  ?ACTIVITY LIMITATIONS cleaning, community activity, driving, and household chores .  ?  ?PERSONAL FACTORS Age and 3+ comorbidities: Leptomeningeal METS,Brain Cancer, Glaucoma, Thyroid disease  are also affecting patient's functional outcome.  ?  ?  ?REHAB POTENTIAL: Good ?  ?CLINICAL DECISION MAKING: Stable/uncomplicated ?  ?EVALUATION COMPLEXITY: Low ?  ?GOALS: ?Goals reviewed with patient? Yes ?  ?SHORT TERM GOALS: ?  ?Pt will be independent and compliant with a HEP to improve strength and balance ?Baseline: None ?Target date: 02/25/2022 ?Goal status: INITIAL ?  ?  ?LONG TERM GOALS: ?  ?Pt will perform 6 sit to stands in 30 seconds for decreased fall risk ?Baseline: 4 ?Target date: 03/18/2022 ?Goal status: INITIAL ?  ?2.  Pt will perform TUG in 17 sec or less for decreased fall risk ?Baseline: 22 seconds ?Target date: 03/18/2022 ?Goal status: INITIAL ?  ?3.  Pt will be able to maintain  tandem stance bilateral for 5-10 seconds for decreased fall risk. ?Baseline: 1-2 sec ?Target date: 03/18/2022 ?Goal status: INITIAL ?  ?4.  Pt will be able to rise from a regular height chair without use of hands

## 2022-03-11 ENCOUNTER — Telehealth: Payer: Self-pay | Admitting: Podiatry

## 2022-03-11 NOTE — Telephone Encounter (Signed)
Patient husband called back - he states someone called them to schedule follow up for ABI  results.  They do not want to proceed at the present time  with any follow up , due to that patient having treatments for cancer.  Thank you

## 2022-03-19 ENCOUNTER — Ambulatory Visit: Payer: Medicare Other

## 2022-03-19 DIAGNOSIS — C569 Malignant neoplasm of unspecified ovary: Secondary | ICD-10-CM

## 2022-03-19 DIAGNOSIS — R296 Repeated falls: Secondary | ICD-10-CM

## 2022-03-19 DIAGNOSIS — M6281 Muscle weakness (generalized): Secondary | ICD-10-CM

## 2022-03-19 DIAGNOSIS — R262 Difficulty in walking, not elsewhere classified: Secondary | ICD-10-CM

## 2022-03-19 DIAGNOSIS — R5381 Other malaise: Secondary | ICD-10-CM

## 2022-03-19 NOTE — Therapy (Addendum)
?OUTPATIENT PHYSICAL THERAPY TREATMENT NOTE ? ? ?Patient Name: Connie Sharp ?MRN: [id number] ?DOB:[birth year], 81 y.o., female ?Today's Date: 03/19/2022 ? ?PCP: Pcp, No ?REFERRING PROVIDER: Heath Lark, MD ? ? PT End of Session - 03/19/22 1101   ? ? Visit Number 8   ? Number of Visits 16   ? Date for PT Re-Evaluation 04/16/22   ? Authorization Type Medicare   ? Authorization - Visit Number 16   ? Progress Note Due on Visit 16   ? PT Start Time 1103   ? PT Stop Time 1150   ? PT Time Calculation (min) 47 min   ? Equipment Utilized During Treatment Gait belt   ? Activity Tolerance Patient tolerated treatment well   ? Behavior During Therapy [facility] for tasks assessed/performed   ? ?  ?  ? ?  ? ? ? ?Past Medical History:  ?Diagnosis Date  ? Anemia   ? Atrophic vaginitis 09/29/2012  ? Brain cancer [facility])   ? metastasis  ? Degenerative, intervertebral disc, cervical   ? Diverticulosis   ? 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 GERD (gastroesophageal reflux disease)   ? Glaucoma   ? left eye  ? Hx of migraines   ? Hyperlipidemia   ? Hypertension   ? Injury of right rotator cuff   ? Kidney stones   ? 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 Migraine   ? Osteoporosis 10/18/2011  ? Ovarian cancer ([place])   ? Thyroid disease   ? hypothyroid  ? ?Past Surgical History:  ?Procedure Laterality Date  ? ABDOMINAL HYSTERECTOMY    ? 1987  ? BILATERAL SALPINGOOPHORECTOMY    ? 07-22-2017  ? CHOLECYSTECTOMY  08/02/2017  ? COLONOSCOPY  08/2015  ? EYE SURGERY Left 2004  ? Left eye wrikleremoval  ? NOSE SURGERY  09/05/2014  ? Fractured Nose   ? Plastic reconstructive     ? Scar tissue left breast at age 67  ? TONSILLECTOMY AND ADENOIDECTOMY    ? ?Patient Active Problem List  ? Diagnosis Date Noted  ? Ingrown toenail 02/11/2022  ? UTI (urinary tract infection) 11/01/2021  ? Other fatigue 04/10/2021  ? Dysuria 04/02/2021  ? 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 Acquired hypothyroidism  03/12/2021  ? 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 Other constipation 03/12/2021  ? Goals of care, counseling/discussion 03/12/2021  ? 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 ? ?REFERRING DIAG: Leptomeningeal metastases (HCC),deconditioning ? ?Physical deconditioning ? ?Muscle weakness (generalized) ? ?Repeated falls ? ?THERAPY DIAG:  ?Physical deconditioning ? ?Muscle weakness (generalized) ? ?Repeated falls ? ?Difficulty in walking, not elsewhere classified ? ?Malignant neoplasm of ovary, unspecified laterality ([place]) ? 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  She had abdominal paracentesis at the time; 04/28/2017 - 06/10/2017 Chemotherapy-3 cycles of neoadjuvant chemo with carboplatin and paclitaxel in [place]; 07/22/2017- exploratory laparotomy and interval cytoreductive surgery. 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 Per pt, she has been given until August to live. Also has Glaucoma, and thyroid disease and is a significant fall risk. ? ?PRECAUTIONS: Fall risk,Brain Cancer , Glaucoma, Thyroid disease ? ?SUBJECTIVE: I was sick to my stomach last Friday 7 times. I never figured out  why. I did OK after last visit and had no dizziness or inability to get out of bed the day after. I am walking slower these days. I can walk in our house without feeling like I need my cane.  I can rise from chairs without hands, but I do still tend to use them. ? ?PAIN:  ?Are you having pain? Yes ?PAIN:  ?Are you having pain? YesPRS scale: 8-9/10 left neck ?Pain location:  left neck ?Pain orientation: Lower ?PAIN TYPE: burning ?Pain description: constant  ?Aggravating factors:laying in bed bothers neck ?Relieving factors:  night meds ? ? ?  ?TODAY'S TREATMENT ? ?03/19/2022 ?Reassessed goals today for 30 sec sit to stand, TUG (practiced with and without cane x 2 first, tandem stance,  and sit  to stand repeated 6 times ?Nu step 5:30 min seat 6 , UE 10, lev 4 ?Airex beam 4 lengths forward and sideways; light hand touch on bars ?SLS bilaterally x 4 ea ?03/07/2022 ?Sit to stand x 5 from treatment table ?Ambulated 225 ft with CGA and 2 short standing rest breaks with emphasis on heel strike and toe off, sitting rest, water ?Heel raises x 10 light touch, marching x 10 light touch ?Sidestepping in bars x 4 lengths no hand hold, sitting rest, water ?Ambulated 75 feet with CGA; sitting rest, water ?Assisted pt to car ? ?03/05/2022 ? ?Mini squats holding bars x 10 ?Heel raises x 10 hand hold, 10 finger touch ?Airex beam 6 lengths forward and 6 lengths sideways CGA of PT light hand touch pt ?Step onto airex right, and then left foot first  forward and lateral x 5 no hand hold, CGA PT ?Nu step x 6 min seat 6, UE 9, lev 5   247 steps ?Obstacle course:serpentine around 6 cones, stepped up on 2 wobble boards, then onto 6 in step and off. CGA of PT with occasional Min A for LOB ?Ambulated  250 feet with CGA of PT but no AD. Practiced relaxing arms at sides and heel strike/toe off ? ?02/28/2022 ?Nu step x 10 min seat 6, UE 9, lev 5  482 steps ?Mini squats holding bars x 10 ?Heel raises x 10 hand hold, 10 finger touch ?Marching hands with fingertips on bar  2 x10 ?Sidestepping in bars x 4 lengths;no hand hold but CGA of PT ?Tandem stance Bilaterally x 3 ?Heel raises x 18 no hand hold but CGA of PT ?Airex beam 6 lengths forward and 6 lengths sideways CGA of PT light hand touch pt ?Step onto airex right, and then left foot first x 10 no hand hold, CGA PT ?Ambulated 250 feet without cane but with CGA of pt. Focus on heel strike, toe off and arm swing. ? ?02/26/2022  ?Sit to stand from lower treatment table 2 x 5 with arms crossed ?Nu step seat 6, UE 10, Lev 5 x  10 min ?Mini squats holding bars x 10 ?Heel raises x 10 hand hold, 10 finger touch ?Marching hands with fingertips on bar  2 x10 ?Tandem stance x 2 ea ?Step ups on ax pad x 10 ea finger touch ?Sidestepping 4 lengths of bar with no hands ?SLS x 2 bilaterally; ? ? ?  ?PATIENT EDUCATION:  ?Education details: gait training with SBQC ?Person educated: Patient ?Education method: Demonstration ?Education comprehension: returned demonstration ?  ?  ?HOME EXERCISE PROGRAM: ?Mini squats, heel raises, marches ?  ?ASSESSMENT: ?  ?CLINICAL IMPRESSION: ?Pts. Goals were reassessed today. She improved slightly with all of her  goals and partially met ability to hold tandem stance 5-10 seconds on the right foot forward, but not the left.  Her TUG score improved by 2 seconds but did not achieve goal.  She is able to perform sit to stand without using arms of chair, but does brace arms on her legs. She had no LOB when doing this.  She was compliant with exercises at home initially, but forgets to do them at times. She remembered that she has pictures and will put them where she can see them and will be more compliant.  She has had several days of sickness, and feelings of dizziness/weakness in the last 2 weeks. Balance does appear improved overall, but when ambulating without AD she holds arms out to the sides, She will benefit from further PT to continue to address deficits , improve balance and to reduce risk of falls. ?  ?  ?OBJECTIVE IMPAIRMENTS Abnormal gait, decreased activity tolerance, decreased balance, difficulty walking, and decreased strength.  ?  ?ACTIVITY LIMITATIONS cleaning, community activity, driving, and household chores .  ?  ?PERSONAL FACTORS Age and 3+ comorbidities: Leptomeningeal METS,Brain Cancer, Glaucoma, Thyroid disease  are also affecting patient's functional outcome.  ?  ?  ?REHAB POTENTIAL: Good ?  ?CLINICAL DECISION MAKING: Stable/uncomplicated ?  ?EVALUATION COMPLEXITY: Low ?  ?GOALS: ?Goals reviewed  with patient? Yes ?  ?SHORT TERM GOALS: ?  ?Pt will be independent and compliant with a HEP to improve strength and balance ?Baseline: None ?Target date: 04/16/2022 ?Goal status: ongoing ? ?  ?  ?LONG TER

## 2022-03-21 ENCOUNTER — Telehealth: Payer: Self-pay | Admitting: *Deleted

## 2022-03-21 NOTE — Telephone Encounter (Signed)
Patient's husband called to report that patient has still been experiencing the neuralgia pain and is taking Gabapentin 300 BID and has had a couple of injections by dr Davy Pique and still no relief.  They wanted to know if something else could be done instead of trying the injections again.   ? ?Per Dr Mickeal Skinner ok to skip injection at this point and try to increase Gabapentin to 600 mg BID until his follow up visit which is in a couple of weeks. ?

## 2022-03-26 ENCOUNTER — Telehealth: Payer: Self-pay

## 2022-03-26 ENCOUNTER — Other Ambulatory Visit: Payer: Self-pay | Admitting: Hematology and Oncology

## 2022-03-26 NOTE — Telephone Encounter (Signed)
Returned call to [place] at [place]. Connie Sharp is wanting to transition from palliative care to Hospice after MRI on 4/28 if okay with Dr. Alvy Bimler. ?Left message that Dr. Alvy Bimler is okay with Connie Sharp going into hospice. They will take over care once she is in hospice. ? ?Just FYI ?

## 2022-03-29 ENCOUNTER — Ambulatory Visit
Admission: RE | Admit: 2022-03-29 | Discharge: 2022-03-29 | Disposition: A | Discharge status: Home / Self Care | Payer: Medicare Other | Source: Ambulatory Visit | Attending: Internal Medicine | Admitting: Internal Medicine

## 2022-03-29 DIAGNOSIS — C7949 Secondary malignant neoplasm of other parts of nervous system: Secondary | ICD-10-CM

## 2022-03-29 IMAGING — MR MR HEAD WO/W CM
12 series · 48 of 48 positions shown · IV contrast (15 ml multihance)
Comparison: Brain MRI [DATE], [DATE], [DATE]

CLINICAL DATA: Malignant neoplasm metastatic to leptomeninges (HCC)
[IZ] ([IZ]-CM)

EXAM:
MRI HEAD WITHOUT AND WITH CONTRAST
TECHNIQUE: Multiplanar, multiecho pulse sequences of the brain and surrounding
structures were obtained without and with intravenous contrast.
CONTRAST:  15mL MULTIHANCE GADOBENATE DIMEGLUMINE 529 MG/ML IV SOLN

[Series 3: FLAIR · sagittal · 3.0mm · 0.75mm/px · 1 of 39 slices shown (1 of 2)]
[im 1/39]
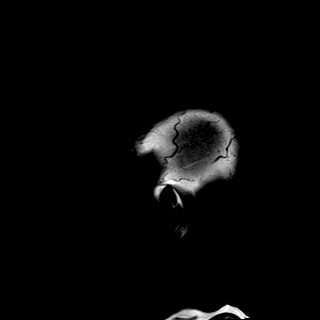

[Series 4: DWI · axial · 3.0mm · 1.50mm/px · z∈[-68,+83]mm · 4 of 82 slices shown (1 of 2)]
[im 1/82]
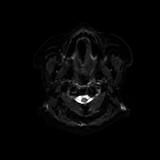
[im 28/82]
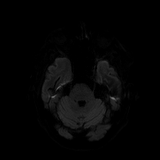
[im 55/82]
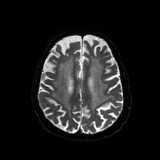
[im 82/82]
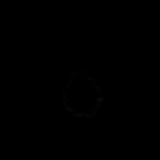

[Series 5: DWI · axial · 3.0mm · 1.50mm/px · z∈[-68,+83]mm · 2 of 41 slices shown (2 of 2)]
[im 1/41]
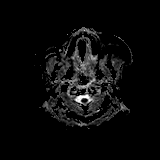
[im 41/41]
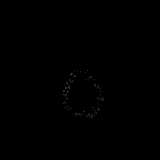

[Series 6: T2 · axial · 5.0mm · 0.57mm/px · 1 of 27 slices shown (1 of 2)]
[im 1/27]
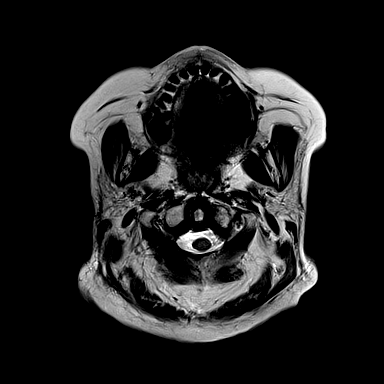

[Series 8: swi_images · axial · 1.5mm · 0.90mm/px · z∈[-67,+83]mm · 5 of 104 slices shown]
[im 1/104]
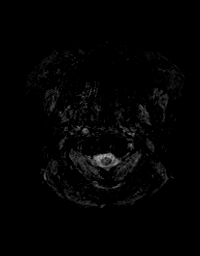
[im 26/104]
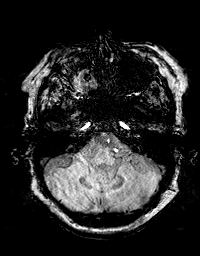
[im 52/104]
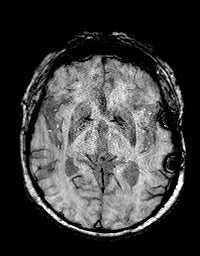
[im 78/104]
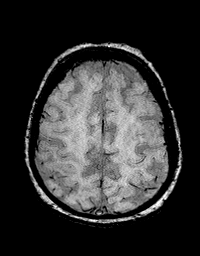
[im 104/104]
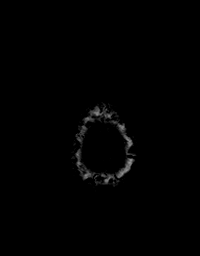

[Series 9: FLAIR · axial · 3.0mm · 0.86mm/px · z∈[-108,+66]mm · 3 of 59 slices shown (2 of 2)]
[im 1/59]
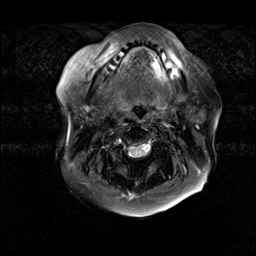
[im 30/59]
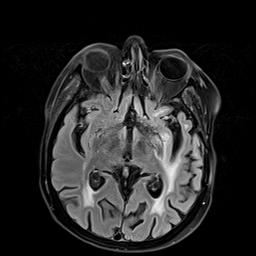
[im 59/59]
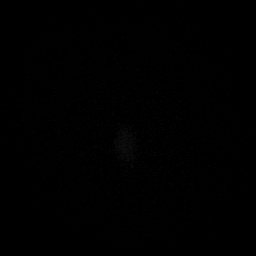

[Series 10: T2 · axial · non-contrast · 1.0mm · 0.86mm/px · z∈[-104,+67]mm · 9 of 176 slices shown (2 of 2)]
[im 1/176]
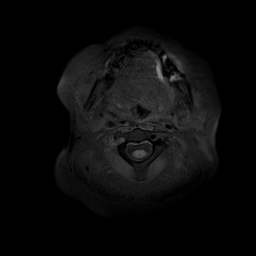
[im 22/176]
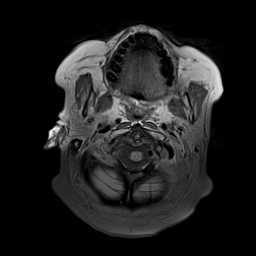
[im 44/176]
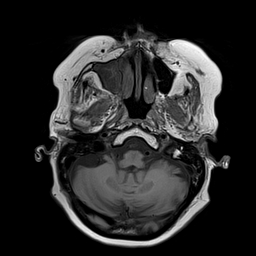
[im 66/176]
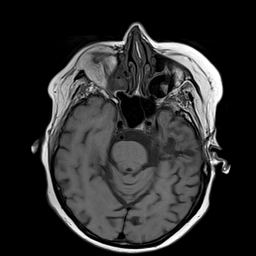
[im 88/176]
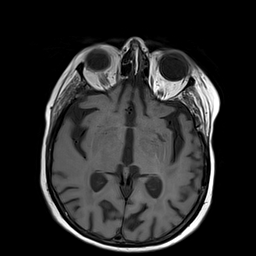
[im 110/176]
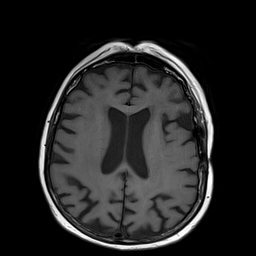
[im 132/176]
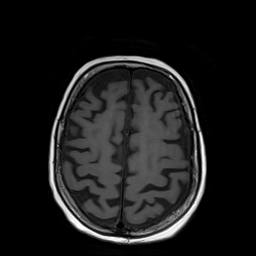
[im 154/176]
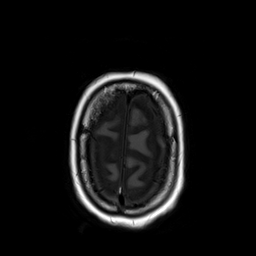
[im 176/176]
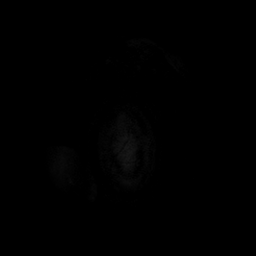

[Series 11: T2 post-contrast · coronal · 3.0mm · 0.57mm/px · 2 of 44 slices shown (1 of 2)]
[im 1/44]
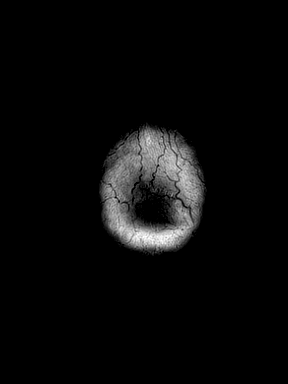
[im 44/44]
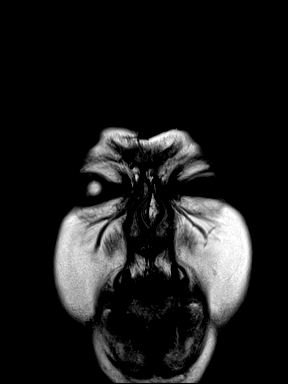

[Series 12: T2 post-contrast · axial · 1.0mm · 0.86mm/px · z∈[-104,+67]mm · 9 of 176 slices shown (2 of 2)]
[im 1/176]
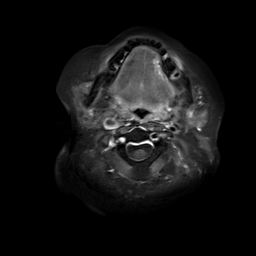
[im 22/176]
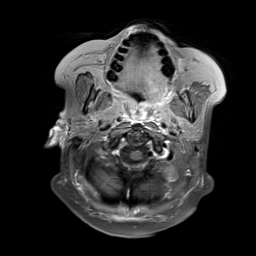
[im 44/176]
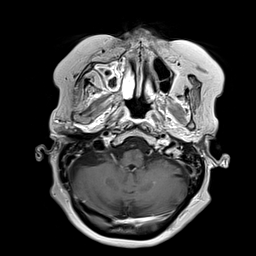
[im 66/176]
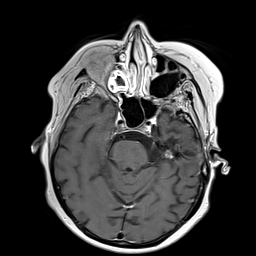
[im 88/176]
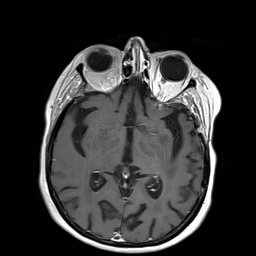
[im 110/176]
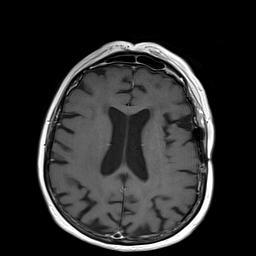
[im 132/176]
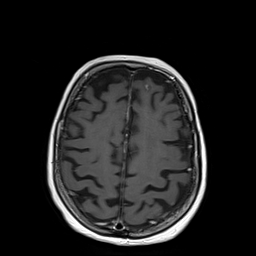
[im 154/176]
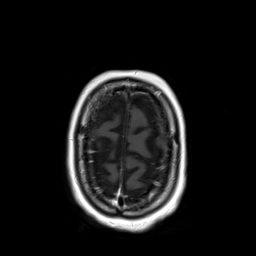
[im 176/176]
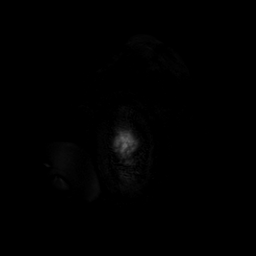

[Series 13: T1 post-contrast · axial · 1.0mm · 0.75mm/px · z∈[-98,+61]mm · 8 of 160 slices shown (1 of 2)]
[im 1/160]
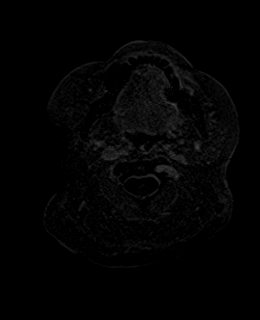
[im 23/160]
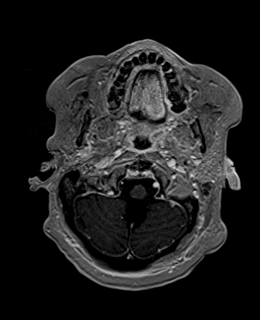
[im 46/160]
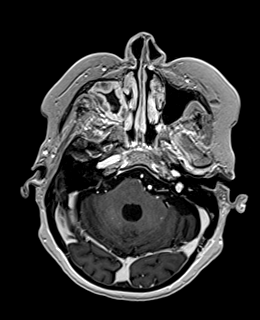
[im 69/160]
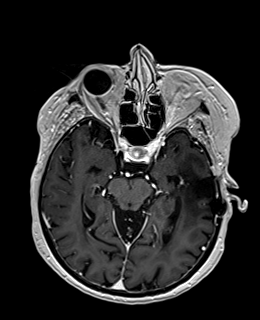
[im 91/160]
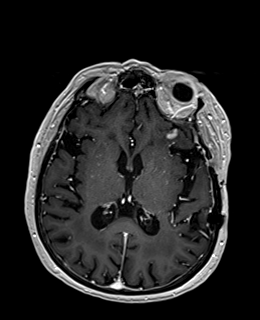
[im 114/160]
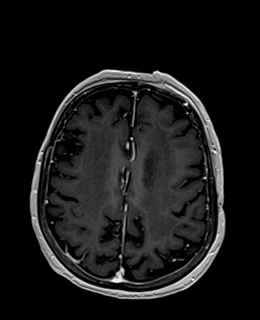
[im 137/160]
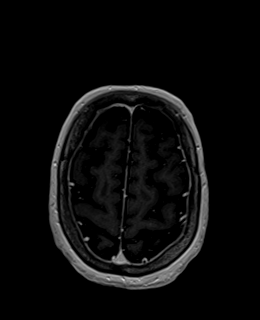
[im 160/160]
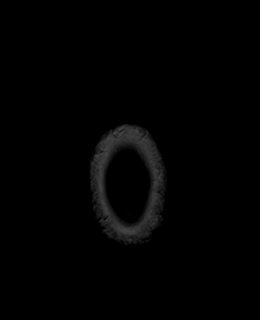

[Series 14: T1 post-contrast · coronal · 3.0mm · 0.57mm/px · 2 of 44 slices shown (2 of 2)]
[im 1/44]
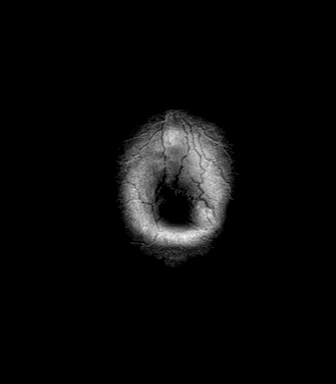
[im 44/44]
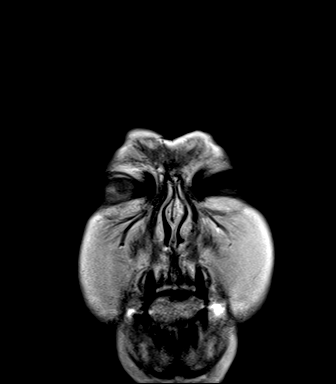

[Series 15: FLAIR post-contrast · sagittal · 3.0mm · 0.75mm/px · 2 of 39 slices shown]
[im 1/39]
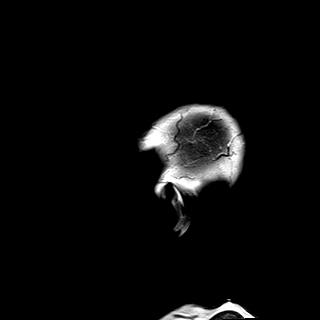
[im 39/39]
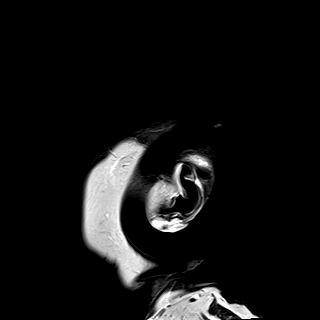

[48 of 48 positions shown; findings below may reference images not displayed]

FINDINGS: Brain: No acute infarct, mass effect or extra-axial collection. No
acute or chronic hemorrhage. Hyperintense T2-weighted signal is
moderately widespread throughout the white matter. Generalized
volume loss without a clear lobar predilection.

Encephalomalacia in the left temporal lobe is unchanged. There are
areas of leptomeningeal contrast enhancement again seen in the left
cerebellum and inferior left frontal lobe. The inferior left frontal
lesion measures 11 x 10 mm, previously 8 x 8 mm ([DATE]). The left
cerebellar lesion is unchanged head measures approximately 13 mm

Increased size of extra-axial mass within the right pre medullary
CSF space ([DATE]) that measures 9 x 6 mm, previously 6 x 3 mm.

Vascular: Major flow voids are preserved.

Skull and upper cervical spine: Normal calvarium and skull base.
Visualized upper cervical spine and soft tissues are normal.

Sinuses/Orbits:No paranasal sinus fluid levels or advanced mucosal
thickening. No mastoid or middle ear effusion. Normal orbits.
IMPRESSION: 1. Increased size of extra-axial lesion within the right pre
medullary CSF space, most likely a dural-based metastasis.
2. Slight increase in size of inferior left frontal leptomeningeal
lesion.
3. Unchanged left cerebellar lesion.

## 2022-03-29 MED ORDER — GADOBENATE DIMEGLUMINE 529 MG/ML IV SOLN
15.0000 mL | Freq: Once | INTRAVENOUS | Status: AC | PRN
  Administered 2022-03-29: 15 mL via INTRAVENOUS

## 2022-04-01 ENCOUNTER — Inpatient Hospital Stay: Payer: Medicare Other | Attending: Gynecologic Oncology

## 2022-04-01 DIAGNOSIS — C569 Malignant neoplasm of unspecified ovary: Secondary | ICD-10-CM | POA: Insufficient documentation

## 2022-04-01 DIAGNOSIS — Z923 Personal history of irradiation: Secondary | ICD-10-CM | POA: Insufficient documentation

## 2022-04-01 DIAGNOSIS — C7931 Secondary malignant neoplasm of brain: Secondary | ICD-10-CM | POA: Insufficient documentation

## 2022-04-01 DIAGNOSIS — Z87891 Personal history of nicotine dependence: Secondary | ICD-10-CM | POA: Insufficient documentation

## 2022-04-01 DIAGNOSIS — Z9221 Personal history of antineoplastic chemotherapy: Secondary | ICD-10-CM | POA: Insufficient documentation

## 2022-04-01 DIAGNOSIS — Z79899 Other long term (current) drug therapy: Secondary | ICD-10-CM | POA: Insufficient documentation

## 2022-04-01 DIAGNOSIS — M5481 Occipital neuralgia: Secondary | ICD-10-CM | POA: Insufficient documentation

## 2022-04-01 DIAGNOSIS — Z7952 Long term (current) use of systemic steroids: Secondary | ICD-10-CM | POA: Insufficient documentation

## 2022-04-02 ENCOUNTER — Inpatient Hospital Stay (HOSPITAL_BASED_OUTPATIENT_CLINIC_OR_DEPARTMENT_OTHER): Payer: Medicare Other | Admitting: Internal Medicine

## 2022-04-02 ENCOUNTER — Other Ambulatory Visit: Payer: Self-pay

## 2022-04-02 VITALS — BP 122/58 | HR 76 | Temp 98.2°F | Resp 18 | Wt 175.4 lb

## 2022-04-02 DIAGNOSIS — C7949 Secondary malignant neoplasm of other parts of nervous system: Secondary | ICD-10-CM

## 2022-04-02 DIAGNOSIS — Z79899 Other long term (current) drug therapy: Secondary | ICD-10-CM | POA: Diagnosis not present

## 2022-04-02 DIAGNOSIS — Z87891 Personal history of nicotine dependence: Secondary | ICD-10-CM | POA: Diagnosis not present

## 2022-04-02 DIAGNOSIS — Z9221 Personal history of antineoplastic chemotherapy: Secondary | ICD-10-CM | POA: Diagnosis not present

## 2022-04-02 DIAGNOSIS — C7931 Secondary malignant neoplasm of brain: Secondary | ICD-10-CM | POA: Diagnosis present

## 2022-04-02 DIAGNOSIS — Z7189 Other specified counseling: Secondary | ICD-10-CM

## 2022-04-02 DIAGNOSIS — C569 Malignant neoplasm of unspecified ovary: Secondary | ICD-10-CM | POA: Diagnosis present

## 2022-04-02 DIAGNOSIS — Z923 Personal history of irradiation: Secondary | ICD-10-CM | POA: Diagnosis not present

## 2022-04-02 DIAGNOSIS — M5481 Occipital neuralgia: Secondary | ICD-10-CM | POA: Diagnosis not present

## 2022-04-02 DIAGNOSIS — Z7952 Long term (current) use of systemic steroids: Secondary | ICD-10-CM | POA: Diagnosis not present

## 2022-04-02 NOTE — Progress Notes (Signed)
? ?[facility] [address]  ?[place], [place] [address] ?[phone]) [phone] ? ? ?Interval Evaluation ? ?Date of Service: 04/02/22 ?Patient Name: Connie Sharp ?Patient MRN: [id number] ?Patient DOB: January 21, 1941 ?Provider: Ventura Sellers, MD ? ?Identifying Statement:  ?Connie Sharp is a 81 y.o. 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 ?Goals of care, counseling/discussion  ? ?Primary Cancer: ? 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  ?Malignant neoplasm of ovary ([place])  ?04/11/2017 Procedure  ? She was diagnosed after omentum biopsy, confirmed high-grade serous.  She had abdominal paracentesis at the time ?  ?04/28/2017 - 06/10/2017 Chemotherapy  ? She had 3 cycles of neoadjuvant chemo with carboplatin and paclitaxel in [place] ? ?  ? ?  ?07/22/2017 Surgery  ? She had exploratory laparotomy and interval cytoreductive surgery ?  ?08/26/2017 - 10/07/2017 Chemotherapy  ? She received 3 more cycles of postoperative chemotherapy with carboplatin and paclitaxel ? ?  ? ?  ?02/01/2019 PET scan  ? Outside PET CT scan showed brain metastasis and cardio phrenic lymph node ?  ?02/18/2019 Surgery  ? She underwent neurosurgical resection of brain mass and postoperative radiation therapy.  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 ?  ?06/02/2019 PET scan  ? Repeat outside PET CT scan showed cardiophrenic lymph node unchanged. ?  ?08/30/2019 Imaging  ? Outside MRI late September showed tumor in the roof of the left orbit. ?  ?09/06/2019 PET scan  ? October 2020 PET CT scan abnormalities on the left orbital tumor and stable cardiophrenic nodule. ?  ?10/04/2019 Surgery  ? She underwent radiosurgery for orbital tumor. ?   ?11/08/2019 Imaging  ? Outside follow-up MRI showed reduce in size of the size of left orbital tumor. ?  ?02/07/2020 Imaging  ? She was noted to have enlarging brain metastasis and receive further radiation treatment. ?  ?02/22/2020 - 03/14/2020 Radiation Therapy  ? She received treatment to left cerebellar lesion, retroclival lesion ?  ?05/12/2020 Imaging  ? Outside MRI and CT scan of the chest, abdomen and pelvis showed disease stability. ?  ?08/10/2020 Imaging  ? Outside CT imaging was reported as stable.  Brain MRI was significant for new infratentorial compartment metastasis. ?  ?10/02/2020 -  Radiation Therapy  ? Date of treatment is unknown but she received further radiation therapy to at least 4 lesions. ?  ?10/12/2020 - 02/22/2021 Chemotherapy  ? She has received concurrent Zejula at 200 mg daily with pembrolizumab ? ?  ? ?  ?01/10/2021 Imaging  ? MRI brain done elsewhere showed new leptomeningeal enhancement of the inferior anterior lateral left frontal lobe, new nodular enhancement of the left anterior cingulate gyrus, increased size of nodular leptomeningeal enhancement of bilateral trigeminal nerves, right superior cerebellar and anterior callosal enhancing lesions have increased in size and left vent trolled lateral cerebellar leptomeningeal enhancement is similar compared to before ? ?CT imaging shows stability of cardiophrenic lymph node ?  ?01/17/2021 - 01/31/2021 Radiation Therapy  ? She received whole brain radiation therapy, 30 Gy in 10 fractions ?  ?01/31/2021 -  Radiation Therapy  ? She received whole brain radiation therapy, 30 Gy completed on 01/31/2021 ?  ?02/21/2021 Tumor Marker  ? Patient's tumor was tested for the following markers: CA-125 ?Results of the tumor marker test  revealed 4 ?  ?02/26/2021 Imaging  ? Outside MRI spine showed leptomeningeal disease in the cervical and thoracic cord, bulky at T1 with significant epidural lesion in that location ?  ?03/12/2021 Cancer Staging  ? Staging form: Ovary,  Fallopian Tube, and Primary Peritoneal Carcinoma, AJCC 8th Edition ?- Clinical stage from 03/12/2021: Stage IV (rcT3, cN0, pM1) - Signed by Heath Lark, MD on 03/12/2021 ?Stage prefix: Recurrence ? ?  ?03/12/2021 Tumor Marker  ? Patient's tumor was tested for the following markers: CA-125 ?Results of the tumor marker test revealed 45.8 ?  ? ? ?Interval History: ? Connie Sharp presents today for follow up after recent MRI brain.  Head and neck pain are improved with the local injections and the gabapentin, but some symptoms persist.  Pain is again sharp in nature, occurs daily, may be reproduced by particular head positioning.  Facial and lower leg numbness is reported as stable.  Short term memory issues and fatigue issues are now more prominent.  Continues to ambulate independently, takes care of ADLs.   ? 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  She describes ~6 weeks history of numbness in her legs, left more than right.  It comes and goes, moves up as high as her hip, sometimes just in the foot itself.  She also describes clear decline in her balance, she is now often walking with a cane.  At home, going from room to room, she does not need the cane.  Otherwise denies headaches, seizures, double vision, cognitive impairment, dysuria, saddle numbness.  Plan was for spine radiation in [place], but she has moved to [place] to be closer to her family. ? ?Medications: ?Current Outpatient Medications on File Prior to Visit  ?Medication Sig Dispense Refill  ? Cholecalciferol (VITAMIN D3) 50 MCG (2000 UT) TABS Take 1 tablet by mouth daily.    ? dexamethasone (DECADRON) 1 MG tablet TAKE 1 TABLET BY MOUTH EVERY DAY 30 tablet 1  ? docusate sodium (COLACE) 100 MG capsule Take 100 mg by mouth 2 (two) times daily.    ? doxylamine, Sleep, (UNISOM) 25 MG tablet Take 25 mg by mouth at bedtime as needed.    ? fluticasone (VERAMYST) 27.5  MCG/SPRAY nasal spray Place 2 sprays into the nose daily.    ? gabapentin (NEURONTIN) 300 MG capsule Take 1 capsule (300 mg total) by mouth 2 (two) times daily. 60 capsule 3  ? levothyroxine (SYNTHROID) 25 MCG tablet Take 1 tablet (25 mcg total) by mouth daily. 90 tablet 0  ? LORazepam (ATIVAN) 1 MG tablet TAKE 1 TABLET BY MOUTH EVERYDAY AT BEDTIME 30 tablet 1  ? Melatonin 10 MG TABS Take by mouth.    ? pantoprazole (PROTONIX) 40 MG tablet Take 1 tablet (40 mg total) by mouth at bedtime. 30 tablet 3  ? polyethylene glycol (MIRALAX / GLYCOLAX) 17 g packet Take 17 g by mouth daily.    ? potassium chloride SA (KLOR-CON M) 20 MEQ tablet Take 1 tablet (20 mEq total) by mouth 2 (two) times daily. 60 tablet 0  ? propranolol ER (INDERAL LA) 120 MG 24 hr capsule TAKE 1 CAPSULE BY MOUTH EVERY DAY 30 capsule 1  ? Sennosides (SENNA) 8.6 MG CAPS Take 2 capsules by mouth 2 (two) times daily.    ? timolol (TIMOPTIC-XR) 0.25 % ophthalmic gel-forming Place 1 drop into both eyes daily.    ? traZODone (DESYREL) 50 MG tablet  Take 0.5-1 tablets (25-50 mg total) by mouth at bedtime. 30 tablet 0  ? ?No current facility-administered medications on file prior to visit.  ? ? ?Allergies:  ?Allergies  ?Allergen Reactions  ? Ambien [Zolpidem Tartrate] Other (See Comments)  ?  Delirium  ? Cephalexin Nausea And Vomiting, Hives and Rash  ? Cefaclor Other (See Comments) and Hives  ?  unknown  ? Noroxin [Norfloxacin] Other (See Comments)  ?  unknown  ? Scallops [Shellfish Allergy] Nausea And Vomiting and Swelling  ? ?Past Medical History:  ?Past Medical History:  ?Diagnosis Date  ? Anemia   ? Atrophic vaginitis 09/29/2012  ? Brain cancer [facility])   ? metastasis  ? Degenerative, intervertebral disc, cervical   ? Diverticulosis   ? Fallopian tube cancer, carcinoma ([place]) 11/02/2018  ? GERD (gastroesophageal reflux disease)   ? Glaucoma   ? left eye  ? Hx of migraines   ? Hyperlipidemia   ? Hypertension   ? Injury of right rotator cuff   ? Kidney stones    ? 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 Migraine   ? Osteoporosis 10/18/2011  ? Ovarian cancer ([place])   ? Thyroid disease   ? hypothyroid  ? ?Past Surgical History:  ?Past Surgical History:  ?Procedure Laterality

## 2022-04-08 ENCOUNTER — Other Ambulatory Visit: Payer: Self-pay | Admitting: Hematology and Oncology

## 2022-04-16 ENCOUNTER — Telehealth: Payer: Self-pay

## 2022-04-16 NOTE — Telephone Encounter (Signed)
[facility] called and left a message. Connie Sharp was admitted to hospice on 5/12, just FYI. ?

## 2022-04-22 ENCOUNTER — Other Ambulatory Visit: Payer: Self-pay | Admitting: Hematology and Oncology

## 2022-05-05 ENCOUNTER — Other Ambulatory Visit: Payer: Self-pay | Admitting: Hematology and Oncology

## 2022-05-07 ENCOUNTER — Other Ambulatory Visit: Payer: Self-pay | Admitting: Hematology and Oncology

## 2022-09-17 ENCOUNTER — Other Ambulatory Visit: Payer: Self-pay

## 2022-09-17 ENCOUNTER — Telehealth (HOSPITAL_COMMUNITY): Payer: Self-pay | Admitting: Emergency Medicine

## 2022-09-17 ENCOUNTER — Emergency Department (HOSPITAL_COMMUNITY): Payer: Medicare Other

## 2022-09-17 ENCOUNTER — Emergency Department (HOSPITAL_COMMUNITY)
Admission: EM | Admit: 2022-09-17 | Discharge: 2022-09-17 | Disposition: A | Discharge status: Home / Self Care | Payer: Medicare Other | Attending: Emergency Medicine | Admitting: Emergency Medicine

## 2022-09-17 ENCOUNTER — Encounter (HOSPITAL_COMMUNITY): Payer: Self-pay | Admitting: *Deleted

## 2022-09-17 DIAGNOSIS — Z79899 Other long term (current) drug therapy: Secondary | ICD-10-CM | POA: Diagnosis not present

## 2022-09-17 DIAGNOSIS — L03115 Cellulitis of right lower limb: Secondary | ICD-10-CM | POA: Diagnosis not present

## 2022-09-17 DIAGNOSIS — I10 Essential (primary) hypertension: Secondary | ICD-10-CM | POA: Insufficient documentation

## 2022-09-17 DIAGNOSIS — S81801A Unspecified open wound, right lower leg, initial encounter: Secondary | ICD-10-CM

## 2022-09-17 DIAGNOSIS — E876 Hypokalemia: Secondary | ICD-10-CM | POA: Insufficient documentation

## 2022-09-17 LAB — COMPREHENSIVE METABOLIC PANEL
ALT: 23 U/L (ref 0–44)
AST: 18 U/L (ref 15–41)
Albumin: 3.1 g/dL — ABNORMAL LOW (ref 3.5–5.0)
Alkaline Phosphatase: 79 U/L (ref 38–126)
Anion gap: 7 (ref 5–15)
BUN: 9 mg/dL (ref 8–23)
CO2: 29 mmol/L (ref 22–32)
Calcium: 8.2 mg/dL — ABNORMAL LOW (ref 8.9–10.3)
Chloride: 104 mmol/L (ref 98–111)
Creatinine, Ser: 0.8 mg/dL (ref 0.44–1.00)
GFR, Estimated: >60 mL/min (ref >60)
Glucose, Bld: 118 mg/dL — ABNORMAL HIGH (ref 70–99)
Potassium: 2.8 mmol/L — ABNORMAL LOW (ref 3.5–5.1)
Sodium: 140 mmol/L (ref 135–145)
Total Bilirubin: 0.9 mg/dL (ref 0.3–1.2)
Total Protein: 6 g/dL — ABNORMAL LOW (ref 6.5–8.1)

## 2022-09-17 LAB — CBC
HCT: 41 % (ref 36.0–46.0)
Hemoglobin: 13.7 g/dL (ref 12.0–15.0)
MCH: 30.6 pg (ref 26.0–34.0)
MCHC: 33.4 g/dL (ref 30.0–36.0)
MCV: 91.7 fL (ref 80.0–100.0)
Platelets: 256 10*3/uL (ref 150–400)
RBC: 4.47 MIL/uL (ref 3.87–5.11)
RDW: 13.8 % (ref 11.5–15.5)
WBC: 8.3 10*3/uL (ref 4.0–10.5)
nRBC: 0 % (ref 0.0–0.2)

## 2022-09-17 MED ORDER — POTASSIUM CHLORIDE 10 MEQ/100ML IV SOLN: 10.0000 meq | INTRAVENOUS | Status: DC

## 2022-09-17 MED ORDER — DEXTROSE 5 % IV SOLN
1500.0000 mg | Freq: Once | INTRAVENOUS | Status: AC
  Administered 2022-09-17: 1500 mg via INTRAVENOUS
  Filled 2022-09-17: qty 75

## 2022-09-17 MED ORDER — MORPHINE SULFATE (PF) 4 MG/ML IV SOLN
4.0000 mg | Freq: Once | INTRAVENOUS | Status: AC
  Administered 2022-09-17: 4 mg via INTRAVENOUS
  Filled 2022-09-17: qty 1

## 2022-09-17 MED ORDER — POTASSIUM CHLORIDE CRYS ER 20 MEQ PO TBCR: 20.0000 meq | EXTENDED_RELEASE_TABLET | Freq: Two times a day (BID) | ORAL | 0 refills | Status: AC

## 2022-09-17 MED ORDER — POTASSIUM CHLORIDE CRYS ER 20 MEQ PO TBCR
40.0000 meq | EXTENDED_RELEASE_TABLET | Freq: Once | ORAL | Status: AC
  Administered 2022-09-17: 40 meq via ORAL
  Filled 2022-09-17: qty 2

## 2022-09-17 MED ORDER — POTASSIUM CHLORIDE 10 MEQ/100ML IV SOLN
10.0000 meq | INTRAVENOUS | Status: AC
  Administered 2022-09-17 (×2): 10 meq via INTRAVENOUS
  Filled 2022-09-17 (×2): qty 100

## 2022-09-17 NOTE — ED Provider Triage Note (Signed)
Emergency Medicine Provider Triage Evaluation Note  Connie Sharp , a 81 y.o. female  was evaluated in triage.  Pt complains of R shin wound for 2 weeks.   Originally a traumatic injury. Has completed course of doxycycline and has been on augmentin for 72 hours.   Review of Systems  Positive: Rash/leg pain Negative: Fever   Physical Exam  BP 119/76 (BP Location: Left Arm)   Pulse 79   Temp 98 F (36.7 C) (Oral)   Resp 16   Ht 5' 2.5" (1.588 m)   Wt 61.2 kg   SpO2 98%   BMI 24.30 kg/m  Gen:   Awake, no distress   Resp:  Normal effort  MSK:   Moves extremities without difficulty  Other:  RLE w wound and redness.   Medical Decision Making  Medically screening exam initiated at 3:08 PM.  Appropriate orders placed.  Emberlyn Burlison was informed that the remainder of the evaluation will be completed by another provider, this initial triage assessment does not replace that evaluation, and the importance of remaining in the ED until their evaluation is complete.  [address]    Pati Gallo [place], [place] 09/17/22 1510

## 2022-09-17 NOTE — ED Provider Notes (Signed)
[facility] DEPT Provider Note   CSN: [id number] Arrival date & time: 09/17/22  1435     History  Chief Complaint  Patient presents with   Leg wound    Connie Sharp is a 81 y.o. female history of hypertension here presenting with right leg wound.  Patient states that she accidentally hit her leg 2 weeks ago when she was trying to get on a bus.  She has finished a course of doxycycline.  She is now on day 3 of Augmentin.  She lives at [facility] and she is sent here because the wound has not healed.  Instead there is some purulent drainage.  She denies any fevers.  The history is provided by the patient.       Home Medications Prior to Admission medications   Medication Sig Start Date End Date Taking? Authorizing Provider  Cholecalciferol (VITAMIN D3) 50 MCG (2000 UT) TABS Take 1 tablet by mouth daily.    [provider]  dexamethasone (DECADRON) 1 MG tablet TAKE 1 TABLET BY MOUTH EVERY DAY 02/04/22   Vaslow, Acey Lav, MD  docusate sodium (COLACE) 100 MG capsule Take 100 mg by mouth 2 (two) times daily.    [provider]  doxylamine, Sleep, (UNISOM) 25 MG tablet Take 25 mg by mouth at bedtime as needed.    [provider]  fluticasone (VERAMYST) 27.5 MCG/SPRAY nasal spray Place 2 sprays into the nose daily.    [provider]  gabapentin (NEURONTIN) 300 MG capsule Take 1 capsule (300 mg total) by mouth 2 (two) times daily. 01/07/22   Ventura Sellers, MD  levothyroxine (SYNTHROID) 25 MCG tablet TAKE 1 TABLET BY MOUTH EVERY DAY 05/06/22   Heath Lark, MD  LORazepam (ATIVAN) 1 MG tablet TAKE 1 TABLET BY MOUTH EVERYDAY AT BEDTIME 02/07/22   Heath Lark, MD  Melatonin 10 MG TABS Take by mouth.    [provider]  pantoprazole (PROTONIX) 40 MG tablet TAKE 1 TABLET BY MOUTH EVERYDAY AT BEDTIME 04/22/22   Heath Lark, MD  polyethylene glycol (MIRALAX / GLYCOLAX) 17 g packet Take 17 g by mouth daily.    [provider]  potassium chloride SA (KLOR-CON M) 20 MEQ tablet Take 1 tablet (20 mEq total) by mouth 2 (two) times daily. 12/14/21   Heath Lark, MD  propranolol ER (INDERAL LA) 120 MG 24 hr capsule TAKE 1 CAPSULE BY MOUTH EVERY DAY 05/07/22   Heath Lark, MD  Sennosides (SENNA) 8.6 MG CAPS Take 2 capsules by mouth 2 (two) times daily.    [provider]  timolol (TIMOPTIC-XR) 0.25 % ophthalmic gel-forming Place 1 drop into both eyes daily.    [provider]  traZODone (DESYREL) 50 MG tablet Take 0.5-1 tablets (25-50 mg total) by mouth at bedtime. 06/14/21   Mbemena, Arnoldo Morale, NP      Allergies    Ambien [zolpidem tartrate], Cephalexin, Cefaclor, Noroxin [norfloxacin], and Scallops [shellfish allergy]    Review of Systems   Review of Systems  Skin:  Positive for wound.  All other systems reviewed and are negative.   Physical Exam Updated Vital Signs BP 119/76 (BP Location: Left Arm)   Pulse 79   Temp 98 F (36.7 C) (Oral)   Resp 16   Ht 5' 2.5" (1.588 m)   Wt 61.2 kg   SpO2 98%   BMI 24.30 kg/m  Physical Exam Vitals and nursing note reviewed.  Constitutional:  Appearance: Normal appearance.  HENT:     Head: Normocephalic.     Nose: Nose normal.     Mouth/Throat:     Mouth: Mucous membranes are moist.  Eyes:     Extraocular Movements: Extraocular movements intact.     Pupils: Pupils are equal, round, and reactive to light.  Cardiovascular:     Rate and Rhythm: Normal rate and regular rhythm.     Pulses: Normal pulses.     Heart sounds: Normal heart sounds.  Pulmonary:     Effort: Pulmonary effort is normal.     Breath sounds: Normal breath sounds.  Abdominal:     General: Abdomen is flat.     Palpations: Abdomen is soft.  Musculoskeletal:     Cervical back: Normal range of motion and neck supple.     Comments: Cellulitis to the right lower extremity.  There is a blister with purulent drainage.  No obvious subcutaneous air  Skin:    General:  Skin is warm.     Capillary Refill: Capillary refill takes less than 2 seconds.     Findings: Erythema present.  Neurological:     General: No focal deficit present.     Mental Status: She is alert and oriented to person, place, and time.  Psychiatric:        Mood and Affect: Mood normal.        Behavior: Behavior normal.      ED Results / Procedures / Treatments   Labs (all labs ordered are listed, but only abnormal results are displayed) Labs Reviewed  CBC  COMPREHENSIVE METABOLIC PANEL    EKG None  Radiology No results found.  Procedures Procedures    Medications Ordered in ED Medications  morphine (PF) 4 MG/ML injection 4 mg (has no administration in time range)    ED Course/ Medical Decision Making/ A&P                           Medical Decision Making Connie Sharp is a 81 y.o. female here presenting with right leg cellulitis.  Patient has been on antibiotics for about 2 weeks.  However she is afebrile and well-appearing.  We will get x-ray to rule out osteomyelitis versus air.  I do not think she has necrotizing fasciitis.  If her white blood cell count is normal and x-ray did not show any osteo-, she may be a good candidate for Dalvance.  11:10 PM White blood cell count is normal.  Potassium is 2.8 and supplemented.  Patient's x-ray did not show any free air.  I discussed with Dr. Tommy Medal from infectious disease.  He agreed with Dalvance.  Patient also received multiple doses of potassium.  Patient will be discharged  Problems Addressed: Cellulitis of right lower extremity: acute illness or injury Hypokalemia: acute illness or injury  Amount and/or Complexity of Data Reviewed Labs: ordered. Decision-making details documented in ED Course. Radiology: ordered and independent interpretation performed. Decision-making details documented in ED Course.  Risk Prescription drug management.    Final Clinical Impression(s) / ED Diagnoses Final  diagnoses:  None    Rx / DC Orders ED Discharge Orders     None         Drenda Freeze, MD 09/17/22 2313

## 2022-09-17 NOTE — Discharge Instructions (Addendum)
You received Dalvance. You do not need to continue taking antibiotics as it will last for 3 days.   Continue dressing changes daily   Take potassium 20 meq twice daily for 5 days   Repeat potassium level in a week   Return to ER if if you have worse cellulitis, fever, purulent discharge

## 2022-09-17 NOTE — Telephone Encounter (Signed)
I received a call from Jenne Pane, staffing with [facility].  Patient being sent to the [facility] ED for evaluation of right lower extremity wound.  Patient is status post injury to the right anterior shin approximately 2 weeks ago.  Patient completed a course of doxycycline from the 10/5 through 13th.  Wound did not improve.  Patient was then started on Augmentin on 10/13 which she has been taking until the present.    Patient with history of ovarian cancer.    Staff at [facility] have been not concerned that patient's wound is not improving and patient may require ED evaluation and/or admission for IV antibiotics.

## 2022-09-17 NOTE — ED Triage Notes (Signed)
Pt has a rt lower leg wound 3 weeks ago, at present it is not getting better. Amoxicillin started this past Friday.

## 2022-09-17 NOTE — Progress Notes (Signed)
Pharmacy Note:  Dalbavancin for Acute Bacterial Skin and Skin Structure Infection (ABSSSI) Patients to [facility] Discharge  Connie Sharp is an 81 y.o. female who presented to [facility] on 09/17/2022 with an Acute Bacterial Skin and Skin Structure Infection.   Dr. Darl Householder (ED provider) discussed appropriateness of use of Dalbavancin with Dr. Tommy Medal (ID provider), who approved use.    Lindell Spar, PharmD, BCPS Clinical Pharmacist  09/17/2022, 7:02 PM

## 2022-09-18 ENCOUNTER — Other Ambulatory Visit (HOSPITAL_COMMUNITY): Payer: Self-pay

## 2022-09-30 NOTE — Progress Notes (Deleted)
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   DEXAMETHASONE (DECADRON) 1 MG TABLET    TAKE 1 TABLET BY MOUTH EVERY DAY   DOCUSATE SODIUM (COLACE) 100 MG CAPSULE    Take 100 mg by mouth 2 (two) times daily.   DOXYLAMINE, SLEEP, (UNISOM) 25 MG TABLET    Take 25 mg by mouth at bedtime as needed.   FLUTICASONE (VERAMYST) 27.5 MCG/SPRAY NASAL SPRAY    Place 2 sprays into the nose daily.   GABAPENTIN (NEURONTIN) 300 MG CAPSULE    Take 1 capsule (300 mg total) by mouth 2 (two) times daily.   LEVOTHYROXINE (SYNTHROID) 25 MCG TABLET    TAKE 1 TABLET BY MOUTH EVERY DAY   LORAZEPAM (ATIVAN) 1 MG TABLET    TAKE 1 TABLET BY MOUTH EVERYDAY AT BEDTIME   MELATONIN 10 MG TABS    Take by mouth.   PANTOPRAZOLE (PROTONIX) 40 MG TABLET    TAKE 1 TABLET BY MOUTH EVERYDAY AT BEDTIME   POLYETHYLENE GLYCOL (MIRALAX / GLYCOLAX) 17 G PACKET    Take 17 g by mouth daily.   POTASSIUM CHLORIDE SA (KLOR-CON M) 20 MEQ TABLET    Take 1 tablet (20 mEq total) by mouth 2 (two) times daily.   POTASSIUM CHLORIDE SA (KLOR-CON M) 20 MEQ TABLET    Take 1 tablet (20 mEq total) by mouth 2 (two) times daily.   PROPRANOLOL ER (INDERAL LA) 120 MG 24 HR CAPSULE    TAKE 1 CAPSULE BY MOUTH EVERY DAY   SENNOSIDES (SENNA) 8.6 MG CAPS    Take 2 capsules by mouth 2 (two) times  daily.   TIMOLOL (TIMOPTIC-XR) 0.25 % OPHTHALMIC GEL-FORMING    Place 1 drop into both eyes daily.   TRAZODONE (DESYREL) 50 MG TABLET    Take 0.5-1 tablets (25-50 mg total) by mouth at bedtime.  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   Migraine    Osteoporosis 10/18/2011   Ovarian cancer ([place])    Thyroid disease    hypothyroid   Past Surgical History:  Procedure Laterality Date   ABDOMINAL HYSTERECTOMY     1987   BILATERAL  SALPINGOOPHORECTOMY     07-22-2017   CHOLECYSTECTOMY  08/02/2017   COLONOSCOPY  08/2015   EYE SURGERY Left 2004   Left eye wrikleremoval   NOSE SURGERY  09/05/2014   Fractured Nose    Plastic reconstructive      Scar tissue left breast at age 25   TONSILLECTOMY AND ADENOIDECTOMY       Social History   Tobacco Use   Smoking status: Former    Packs/day: 0.25    Years: 4.00    Total pack years: 1.00    Types: Cigarettes    Quit date: 03/07/1965    Years since quitting: 56.6   Smokeless tobacco: Never  Vaping Use   Vaping Use: Never used  Substance Use Topics   Alcohol use: Yes    Comment: occasionally   Drug use: Never    Family History  Problem Relation Age of Onset   Hypertension Mother    Congestive Heart Failure Mother    Hypercholesterolemia Mother    Macular degeneration Father    Non-Hodgkin's lymphoma Brother    Colon cancer Neg Hx    Ovarian cancer Neg Hx    Breast cancer Neg Hx     Endometrial cancer Neg Hx    Pancreatic cancer Neg Hx    Prostate cancer Neg Hx     Allergies  Allergen Reactions   Ambien [Zolpidem Tartrate] Other (See Comments)    Delirium   Cephalexin Nausea And Vomiting, Hives and Rash   Cefaclor Other (See Comments) and Hives    unknown   Noroxin [Norfloxacin] Other (See Comments)    unknown   Scallops [Shellfish Allergy] Nausea And Vomiting and Swelling    Health Maintenance  Topic Date Due   Medicare Annual Wellness (AWV)  Never done   Hepatitis C Screening  Never done   DEXA SCAN  Never done   COVID-19 Vaccine (3 - [place] risk series) 03/20/2020   INFLUENZA VACCINE  07/02/2022   TETANUS/TDAP  09/05/2024   Pneumonia Vaccine 100+ Years old  Completed   Zoster Vaccines- Shingrix  Completed   HPV VACCINES  Aged Out    Objective:  There were no vitals filed for this visit. There is no height or weight on file to calculate BMI.  Physical Exam Constitutional:      Appearance: Normal appearance.  HENT:     Head: Normocephalic and atraumatic.      Mouth: Mucous membranes are moist.  Eyes:    Conjunctiva/sclera: Conjunctivae normal.     Pupils: Pupils are equal, round, and reactive to light.   Cardiovascular:     Rate and Rhythm: Normal rate and regular rhythm.     Heart sounds: No murmur heard. No friction rub. No gallop.   Pulmonary:     Effort: Pulmonary effort is normal.     Breath sounds: Normal breath sounds.   Abdominal:     General: Non distended     Palpations: soft.   Musculoskeletal:        General: Normal range of motion.   Skin:    General: Skin is warm and dry.     Comments:  Neurological:     General: grossly non focal     Mental Status: awake, alert and oriented to person, place, and time.   Psychiatric:        Mood and Affect: Mood normal.   Lab Results Lab Results  Component Value Date   WBC 8.3 09/17/2022   HGB 13.7 09/17/2022  HCT 41.0 09/17/2022   MCV 91.7 09/17/2022   PLT 256  09/17/2022    Lab Results  Component Value Date   CREATININE 0.80 09/17/2022   BUN 9 09/17/2022   NA 140 09/17/2022   K 2.8 (L) 09/17/2022   CL 104 09/17/2022   CO2 29 09/17/2022    Lab Results  Component Value Date   ALT 23 09/17/2022   AST 18 09/17/2022   ALKPHOS 79 09/17/2022   BILITOT 0.9 09/17/2022    No results found for: "CHOL", "HDL", "LDLCALC", "LDLDIRECT", "TRIG", "CHOLHDL" No results found for: "LABRPR", "RPRTITER" No results found for: "HIV1RNAQUANT", "HIV1RNAVL", "CD4TABS"   Problem List Items Addressed This Visit   None   I have personally spent more than 70 minutes involved in face-to-face and non-face-to-face activities for this patient on the day of the visit. Professional time spent includes the following activities: Preparing to see the patient (review of tests), Obtaining and/or reviewing separately obtained history (admission/discharge record), Performing a medically appropriate examination and/or evaluation , Ordering medications/tests/procedures, referring and communicating with other health care professionals, Documenting clinical information in the EMR, Independently interpreting results (not separately reported), Communicating results to the patient/family/caregiver, Counseling and educating the patient/family/caregiver and Care coordination (not separately reported).   Wilber Oliphant, [facility] 09/30/2022, 9:40 AM

## 2022-10-01 ENCOUNTER — Ambulatory Visit: Payer: Medicare Other | Admitting: Infectious Diseases

## 2022-10-03 ENCOUNTER — Ambulatory Visit (INDEPENDENT_AMBULATORY_CARE_PROVIDER_SITE_OTHER): Admitting: Infectious Diseases

## 2022-10-03 ENCOUNTER — Other Ambulatory Visit: Payer: Self-pay

## 2022-10-03 ENCOUNTER — Encounter: Payer: Self-pay | Admitting: Infectious Diseases

## 2022-10-03 VITALS — BP 109/71 | HR 71 | Temp 98.6°F | Resp 16 | Ht 62.5 in | Wt 140.0 lb

## 2022-10-03 DIAGNOSIS — L0390 Cellulitis, unspecified: Secondary | ICD-10-CM | POA: Diagnosis not present

## 2022-10-03 DIAGNOSIS — L03119 Cellulitis of unspecified part of limb: Secondary | ICD-10-CM

## 2022-10-03 DIAGNOSIS — Z79899 Other long term (current) drug therapy: Secondary | ICD-10-CM

## 2022-10-03 NOTE — Progress Notes (Signed)
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   DEXAMETHASONE (DECADRON) 1 MG TABLET    TAKE 1 TABLET BY MOUTH EVERY DAY   DOCUSATE SODIUM (COLACE) 100 MG CAPSULE    Take 100 mg by mouth 2 (two) times daily.   DOXYLAMINE, SLEEP, (UNISOM) 25 MG TABLET    Take 25 mg by mouth at bedtime as needed.   FLUTICASONE (VERAMYST) 27.5 MCG/SPRAY NASAL SPRAY    Place 2 sprays into the nose daily.   GABAPENTIN (NEURONTIN) 300 MG CAPSULE    Take 1 capsule (300 mg total) by mouth 2 (two) times daily.   LEVOTHYROXINE (SYNTHROID) 25 MCG TABLET    TAKE 1 TABLET BY MOUTH EVERY DAY   LORAZEPAM (ATIVAN) 1 MG TABLET    TAKE 1 TABLET BY MOUTH EVERYDAY AT BEDTIME   MELATONIN 10 MG TABS    Take by mouth.   PANTOPRAZOLE (PROTONIX) 40 MG TABLET    TAKE 1 TABLET BY MOUTH EVERYDAY AT BEDTIME   POLYETHYLENE GLYCOL (MIRALAX / GLYCOLAX) 17 G PACKET    Take 17 g by mouth daily.   POTASSIUM CHLORIDE SA (KLOR-CON M) 20 MEQ TABLET    Take 1 tablet (20 mEq total) by mouth 2 (two) times daily.   POTASSIUM CHLORIDE SA (KLOR-CON M) 20 MEQ TABLET    Take 1 tablet (20 mEq total) by mouth 2 (two) times daily.   PROPRANOLOL ER (INDERAL LA) 120 MG 24 HR CAPSULE    TAKE 1 CAPSULE BY MOUTH EVERY DAY   SENNOSIDES (SENNA) 8.6 MG CAPS    Take 2 capsules by mouth 2 (two) times daily.    TIMOLOL (TIMOPTIC-XR) 0.25 % OPHTHALMIC GEL-FORMING    Place 1 drop into both eyes daily.   TRAZODONE (DESYREL) 50 MG TABLET    Take 0.5-1 tablets (25-50 mg total) by mouth at bedtime.  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 Alvy Bimler  and neuro oncology Dr. Mickeal Skinner s/p abdominal hysterectomy and b/l salpingo-oophorectomy,GERD, HLD, HTN who is referred from ED after recent visit for cellulitis. She is accompanied by her DIL who is helping with her history. 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   Seen in the ED on 10/17 for non healing wound cellulitis of rt anterior leg.  Patient accidentally hit her leg when she was trying to get on a bus 3-4 weeks ago.  She had completed a course of doxycycline and was on D 3 of augmentin when seen in ED.  She was seen in the ED as wound had not healed  and also started some purulent discharge . Xray was positive for mild diffuse subcutaneous edema. She was given a dose of dalbavancin and discharged from ED.   She showed me the picture of her wound when it had actually started. The wound currently seems significantly improved from what I saw in the picture. She has mild soreness in the wound area but no significant pain, swelling, tenderness. She is ambulatory. Denies any fevers, chills, sweats. Denies nausea, vomiting, abdominal pain and diarrhea. Denies any rashes and GU symptoms. Generalized joint pain +. Constipation+.   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   Migraine    Osteoporosis 10/18/2011   Ovarian cancer ([place])    Thyroid disease    hypothyroid   Past Surgical History:  Procedure Laterality Date   ABDOMINAL HYSTERECTOMY     1987   BILATERAL SALPINGOOPHORECTOMY     07-22-2017   CHOLECYSTECTOMY  08/02/2017   COLONOSCOPY  08/2015   EYE SURGERY Left 2004   Left eye wrikleremoval   NOSE SURGERY  09/05/2014   Fractured Nose    Plastic reconstructive      Scar tissue left breast at age 55   TONSILLECTOMY AND ADENOIDECTOMY      Social History   Tobacco Use   Smoking status: Former    Packs/day: 0.25    Years: 4.00    Total pack years: 1.00    Types: Cigarettes    Quit date: 03/07/1965    Years since quitting: 57.6   Smokeless tobacco: Never  Vaping Use   Vaping Use: Never used  Substance Use Topics   Alcohol use: Yes    Comment: occasionally   Drug use: Never    Family History  Problem Relation Age of Onset   Hypertension Mother    Congestive Heart Failure Mother    Hypercholesterolemia Mother    Macular degeneration Father    Non-Hodgkin's lymphoma Brother    Colon cancer Neg Hx    Ovarian cancer Neg Hx    Breast cancer Neg Hx    Endometrial cancer Neg Hx    Pancreatic cancer Neg Hx    Prostate cancer Neg Hx     Allergies  Allergen Reactions   Ambien [Zolpidem Tartrate] Other (See Comments)    Delirium   Cephalexin Nausea And Vomiting, Hives and Rash   Cefaclor Other (See Comments) and Hives    unknown   Noroxin [Norfloxacin] Other (See Comments)    unknown   Scallops [Shellfish Allergy] Nausea And Vomiting and Swelling    Health Maintenance  Topic Date Due   Medicare Annual Wellness (AWV)  Never done    Hepatitis C Screening  Never done   DEXA SCAN  Never done   COVID-19 Vaccine (3 - [facility] risk series) 03/20/2020   INFLUENZA VACCINE  07/02/2022   TETANUS/TDAP  09/05/2024   Pneumonia Vaccine 49+ Years old  Completed   Zoster Vaccines-  Shingrix  Completed   HPV VACCINES  Aged Out    Objective: BP 109/71   Pulse 71   Temp 98.6 F (37 C) (Temporal)   Resp 16   Ht 5' 2.5" (1.588 m)   Wt 140 lb (63.5 kg)   SpO2 98%   BMI 25.20 kg/m    Physical Exam Constitutional:      Appearance: Normal appearance. Elderly white female, appears comfortable  HENT:     Head: Normocephalic and atraumatic.      Mouth: Mucous membranes are moist.  Eyes:    Conjunctiva/sclera: Conjunctivae normal.     Pupils:  Cardiovascular:     Rate and Rhythm: Normal rate and regular rhythm.     Heart sounds:   Pulmonary:     Effort: Pulmonary effort is normal.     Breath sounds: Normal breath sounds.   Abdominal:     General: Non distended     Palpations: soft.   Musculoskeletal:        General: Normal range of motion.   Rt leg:   Superfical wound in the rt anterior leg with no active drainage, surrounding erythema/tenderness. No fluctuance or crepitus, healing   Skin:    General: Skin is warm and dry.     Comments:  Neurological:     General: grossly non focal     Mental Status: awake, alert and oriented to person, place, and time.   Psychiatric:        Mood and Affect: Mood normal.   Lab Results Lab Results  Component Value Date   WBC 8.3 09/17/2022   HGB 13.7 09/17/2022   HCT 41.0 09/17/2022   MCV 91.7 09/17/2022   PLT 256 09/17/2022    Lab Results  Component Value Date   CREATININE 0.80 09/17/2022   BUN 9 09/17/2022   NA 140 09/17/2022   K 2.8 (L) 09/17/2022   CL 104 09/17/2022   CO2 29 09/17/2022    Lab Results  Component Value Date   ALT 23 09/17/2022   AST 18 09/17/2022   ALKPHOS 79 09/17/2022   BILITOT 0.9 09/17/2022    No results found for: "CHOL", "HDL",  "LDLCALC", "LDLDIRECT", "TRIG", "CHOLHDL" No results found for: "LABRPR", "RPRTITER" No results found for: "HIV1RNAQUANT", "HIV1RNAVL", "CD4TABS"   Microbiology  Results for orders placed or performed in visit on 11/28/21  Culture, Urine     Status: None   Collection Time: 11/28/21  1:42 PM   Specimen: Urine, Clean Catch  Result Value Ref Range Status   Specimen Description   Final    URINE, CLEAN CATCH Performed at [facility], [place] [address], [place], [place] [address]    Special Requests   Final    NONE Performed at [facility], [place] [address] [place], [place] [address]    Culture   Final    NO GROWTH Performed at [facility], [place] [address], [place], [place] [address]    Report Status 11/29/2021 FINAL  Final   Imaging DG Tibia/Fibula Right  Result Date: 09/17/2022 CLINICAL DATA:  Right leg cellulitis EXAM: RIGHT TIBIA AND FIBULA - 2 VIEW COMPARISON:  None Available. FINDINGS: There is no evidence of fracture or other focal bone lesions. There is mild diffuse subcutaneous edema within the right lower extremity. No retained radiopaque foreign body. No subcutaneous gas. IMPRESSION: Mild diffuse subcutaneous edema. No subcutaneous gas or retained radiopaque foreign body. Electronically Signed   By: Cassandria Anger  Christa See M.D.   On: 09/17/2022 17:55    Assessment/Plan # Cellulitis/wound in the rt anterior leg in the setting of fall - failed PO abtx ( doxycyline then augmentin) - s/p one dose of dalbavancin in the ED 10/17 - clinically healing well - continue wound care  - no indication for further abtx. Discussed warning signs to seek immediate attention like fevers, worsening wound with increasing redness, tenderness - Fu as needed   I have personally spent 70 minutes involved in face-to-face and non-face-to-face activities for this patient on the day of the visit. Professional time spent includes the following activities:  Preparing to see the patient (review of tests), Obtaining and/or reviewing separately obtained history (admission/discharge record), Performing a medically appropriate examination and/or evaluation , Ordering medications/tests/procedures, referring and communicating with other health care professionals, Documenting clinical information in the EMR, Independently interpreting results (not separately reported), Communicating results to the patient/family/caregiver, Counseling and educating the patient/family/caregiver and Care coordination (not separately reported).   Wilber Oliphant, [place] [facility] 10/03/2022, 1:11 PM

## 2022-10-05 DIAGNOSIS — L0390 Cellulitis, unspecified: Secondary | ICD-10-CM | POA: Insufficient documentation

## 2023-01-02 DEATH — deceased
